# Patient Record
Sex: Male | Born: 1955 | Race: White | Hispanic: No | Marital: Married
Health system: Southern US, Community
[De-identification: ages and names within clinical notes are randomized; demographics above are authoritative.]

## PROBLEM LIST (undated history)

## (undated) DIAGNOSIS — F1010 Alcohol abuse, uncomplicated: Secondary | ICD-10-CM

## (undated) DIAGNOSIS — I499 Cardiac arrhythmia, unspecified: Secondary | ICD-10-CM

## (undated) DIAGNOSIS — J302 Other seasonal allergic rhinitis: Secondary | ICD-10-CM

## (undated) DIAGNOSIS — M1990 Unspecified osteoarthritis, unspecified site: Secondary | ICD-10-CM

## (undated) DIAGNOSIS — L719 Rosacea, unspecified: Secondary | ICD-10-CM

## (undated) DIAGNOSIS — J189 Pneumonia, unspecified organism: Secondary | ICD-10-CM

## (undated) HISTORY — PX: COLONOSCOPY: SHX174

## (undated) HISTORY — DX: Cardiac arrhythmia, unspecified: I49.9

## (undated) HISTORY — DX: Alcohol abuse, uncomplicated: F10.10

---

## 1975-10-23 HISTORY — PX: KNEE ARTHROTOMY: SUR107

## 1999-08-07 ENCOUNTER — Ambulatory Visit (HOSPITAL_COMMUNITY): Admission: RE | Admit: 1999-08-07 | Discharge: 1999-08-07 | Payer: Self-pay | Admitting: Orthopedic Surgery

## 1999-08-07 ENCOUNTER — Encounter: Payer: Self-pay | Admitting: Orthopedic Surgery

## 1999-08-21 ENCOUNTER — Encounter: Payer: Self-pay | Admitting: Orthopedic Surgery

## 1999-08-21 ENCOUNTER — Ambulatory Visit (HOSPITAL_COMMUNITY): Admission: RE | Admit: 1999-08-21 | Discharge: 1999-08-21 | Payer: Self-pay | Admitting: Orthopedic Surgery

## 1999-09-04 ENCOUNTER — Encounter: Payer: Self-pay | Admitting: Orthopedic Surgery

## 1999-09-04 ENCOUNTER — Ambulatory Visit (HOSPITAL_COMMUNITY): Admission: RE | Admit: 1999-09-04 | Discharge: 1999-09-04 | Payer: Self-pay | Admitting: Orthopedic Surgery

## 2004-01-25 ENCOUNTER — Encounter: Admission: RE | Admit: 2004-01-25 | Discharge: 2004-01-25 | Payer: Self-pay | Admitting: General Surgery

## 2004-01-27 ENCOUNTER — Ambulatory Visit (HOSPITAL_BASED_OUTPATIENT_CLINIC_OR_DEPARTMENT_OTHER): Admission: RE | Admit: 2004-01-27 | Discharge: 2004-01-27 | Payer: Self-pay | Admitting: General Surgery

## 2004-01-27 ENCOUNTER — Ambulatory Visit (HOSPITAL_COMMUNITY): Admission: RE | Admit: 2004-01-27 | Discharge: 2004-01-27 | Payer: Self-pay | Admitting: General Surgery

## 2004-06-19 ENCOUNTER — Emergency Department (HOSPITAL_COMMUNITY): Admission: EM | Admit: 2004-06-19 | Discharge: 2004-06-19 | Payer: Self-pay | Admitting: Emergency Medicine

## 2011-01-23 ENCOUNTER — Emergency Department (HOSPITAL_COMMUNITY)
Admission: EM | Admit: 2011-01-23 | Discharge: 2011-01-24 | Disposition: A | Discharge status: Home / Self Care | Payer: PRIVATE HEALTH INSURANCE | Attending: Emergency Medicine | Admitting: Emergency Medicine

## 2011-01-23 DIAGNOSIS — Y9367 Activity, basketball: Secondary | ICD-10-CM | POA: Insufficient documentation

## 2011-01-23 DIAGNOSIS — W219XXA Striking against or struck by unspecified sports equipment, initial encounter: Secondary | ICD-10-CM | POA: Insufficient documentation

## 2011-01-23 DIAGNOSIS — S0180XA Unspecified open wound of other part of head, initial encounter: Secondary | ICD-10-CM | POA: Insufficient documentation

## 2011-01-23 DIAGNOSIS — S0990XA Unspecified injury of head, initial encounter: Secondary | ICD-10-CM | POA: Insufficient documentation

## 2014-08-17 ENCOUNTER — Ambulatory Visit: Payer: Self-pay | Admitting: Orthopedic Surgery

## 2014-08-17 NOTE — Progress Notes (Signed)
Preoperative surgical orders have been place into the [facility] hospital system for Danny Kerr on 08/17/2014, 5:44 PM  by Mickel Crow for surgery on 09/06/2014.  Preop Total Knee orders including Experal, IV Tylenol, and IV Decadron as long as there are no contraindications to the above medications. Arlee Muslim, PA-C

## 2014-08-25 ENCOUNTER — Other Ambulatory Visit (HOSPITAL_COMMUNITY): Payer: Self-pay | Admitting: *Deleted

## 2014-08-25 NOTE — Patient Instructions (Addendum)
Danny Kerr  08/25/2014                           YOUR PROCEDURE IS SCHEDULED ON: 09/06/14                ENTER FROM [address] - GO TO [address] [address]               LOOK FOR [facility]  / [employer]                              FOLLOW  SIGNS TO SHORT STAY CENTER                 ARRIVE AT SHORT STAY AT: 10:45 AM               CALL THIS NUMBER IF ANY PROBLEMS THE DAY OF SURGERY :               [phone]                                REMEMBER:   Do not eat food  AFTER MIDNIGHT              MAY HAVE CLEAR LIQUIDS UNTIL 7:45 AM     CLEAR LIQUID DIET   Foods Allowed                                                                     Foods Excluded  Coffee and tea, regular and decaf                             liquids that you cannot  Plain Jell-O in any flavor                                             see through such as: Fruit ices (not with fruit pulp)                                     milk, soups, orange juice  Iced Popsicles                                    All solid food Carbonated beverages, regular and diet                                    Cranberry, grape and apple juices Sports drinks like Gatorade Lightly seasoned clear broth or consume(fat free) Sugar, honey syrup  _____________________________________________________________________                    Take these medicines the morning of surgery with  A SIPS OF WATER :    NONE     Do not wear jewelry, make-up   Do not wear lotions, powders, or perfumes.   Do not shave legs or underarms 12 hrs. before surgery (men may shave face)  Do not bring valuables to the hospital.  Contacts, dentures or bridgework may not be worn into surgery.  Leave suitcase in the car. After surgery it may be brought to your room.  For patients admitted to the hospital more than one night, checkout time is            11:00 AM                                                        ________________________________________________________________________                                                                                                  [place]  Before surgery, you can play an important role.  Because skin is not sterile, your skin needs to be as free of germs as possible.  You can reduce the number of germs on your skin by washing with CHG (chlorahexidine gluconate) soap before surgery.  CHG is an antiseptic cleaner which kills germs and bonds with the skin to continue killing germs even after washing. Please DO NOT use if you have an allergy to CHG or antibacterial soaps.  If your skin becomes reddened/irritated stop using the CHG and inform your nurse when you arrive at Short Stay. Do not shave (including legs and underarms) for at least 48 hours prior to the first CHG shower.  You may shave your face. Please follow these instructions carefully:   1.  Shower with CHG Soap the night before surgery and the  morning of Surgery.   2.  If you choose to wash your hair, wash your hair first as usual with your  normal  Shampoo.   3.  After you shampoo, rinse your hair and body thoroughly to remove the  shampoo.                                         4.  Use CHG as you would any other liquid soap.  You can apply chg directly  to the skin and wash . Gently wash with scrungie or clean wascloth    5.  Apply the CHG Soap to your body ONLY FROM THE NECK DOWN.   Do not use on open                           Wound or open sores. Avoid contact with eyes, ears mouth and genitals (private parts).  Genitals (private parts) with your normal soap.              6.  Wash thoroughly, paying special attention to the area where your surgery  will be performed.   7.  Thoroughly rinse your body with warm water from the neck down.   8.  DO NOT shower/wash with your normal soap after using and rinsing off  the CHG Soap .                 9.  Pat yourself dry with a clean towel.             10.  Wear clean pajamas.             11.  Place clean sheets on your bed the night of your first shower and do not  sleep with pets.  Day of Surgery : Do not apply any lotions/deodorants the morning of surgery.  Please wear clean clothes to the hospital/surgery center.  FAILURE TO FOLLOW THESE INSTRUCTIONS MAY RESULT IN THE CANCELLATION OF YOUR SURGERY    PATIENT SIGNATURE_________________________________  ______________________________________________________________________     Danny Kerr  An incentive spirometer is a tool that can help keep your lungs clear and active. This tool measures how well you are filling your lungs with each breath. Taking long deep breaths may help reverse or decrease the chance of developing breathing (pulmonary) problems (especially infection) following:  A long period of time when you are unable to move or be active. BEFORE THE PROCEDURE   If the spirometer includes an indicator to show your best effort, your nurse or respiratory therapist will set it to a desired goal.  If possible, sit up straight or lean slightly forward. Try not to slouch.  Hold the incentive spirometer in an upright position. INSTRUCTIONS FOR USE   Sit on the edge of your bed if possible, or sit up as far as you can in bed or on a chair.  Hold the incentive spirometer in an upright position.  Breathe out normally.  Place the mouthpiece in your mouth and seal your lips tightly around it.  Breathe in slowly and as deeply as possible, raising the piston or the ball toward the top of the column.  Hold your breath for 3-5 seconds or for as long as possible. Allow the piston or ball to fall to the bottom of the column.  Remove the mouthpiece from your mouth and breathe out normally.  Rest for a few seconds and repeat Steps 1 through 7 at least 10 times every 1-2 hours when you are awake. Take your  time and take a few normal breaths between deep breaths.  The spirometer may include an indicator to show your best effort. Use the indicator as a goal to work toward during each repetition.  After each set of 10 deep breaths, practice coughing to be sure your lungs are clear. If you have an incision (the cut made at the time of surgery), support your incision when coughing by placing a pillow or rolled up towels firmly against it. Once you are able to get out of bed, walk around indoors and cough well. You may stop using the incentive spirometer when instructed by your caregiver.  RISKS AND COMPLICATIONS  Take your time so you do not get dizzy or light-headed.  If you are in pain, you may need to take or ask for pain medication before doing incentive spirometry. It is harder to take a  deep breath if you are having pain. AFTER USE  Rest and breathe slowly and easily.  It can be helpful to keep track of a log of your progress. Your caregiver can provide you with a simple table to help with this. If you are using the spirometer at home, follow these instructions: [place] IF:   You are having difficultly using the spirometer.  You have trouble using the spirometer as often as instructed.  Your pain medication is not giving enough relief while using the spirometer.  You develop fever of 100.5 F (38.1 C) or higher. SEEK IMMEDIATE MEDICAL CARE IF:   You cough up bloody sputum that had not been present before.  You develop fever of 102 F (38.9 C) or greater.  You develop worsening pain at or near the incision site. MAKE SURE YOU:   Understand these instructions.  Will watch your condition.  Will get help right away if you are not doing well or get worse. Document Released: 02/18/2007 Document Revised: 12/31/2011 Document Reviewed: 04/21/2007 ExitCare Patient Information 2014 ExitCare,  [place].   ________________________________________________________________________  WHAT IS A BLOOD TRANSFUSION? Blood Transfusion Information  A transfusion is the replacement of blood or some of its parts. Blood is made up of multiple cells which provide different functions.  Red blood cells carry oxygen and are used for blood loss replacement.  White blood cells fight against infection.  Platelets control bleeding.  Plasma helps clot blood.  Other blood products are available for specialized needs, such as hemophilia or other clotting disorders. BEFORE THE TRANSFUSION  Who gives blood for transfusions?   Healthy volunteers who are fully evaluated to make sure their blood is safe. This is blood bank blood. Transfusion therapy is the safest it has ever been in the practice of medicine. Before blood is taken from a donor, a complete history is taken to make sure that person has no history of diseases nor engages in risky social behavior (examples are intravenous drug use or sexual activity with multiple partners). The donor's travel history is screened to minimize risk of transmitting infections, such as malaria. The donated blood is tested for signs of infectious diseases, such as HIV and hepatitis. The blood is then tested to be sure it is compatible with you in order to minimize the chance of a transfusion reaction. If you or a relative donates blood, this is often done in anticipation of surgery and is not appropriate for emergency situations. It takes many days to process the donated blood. RISKS AND COMPLICATIONS Although transfusion therapy is very safe and saves many lives, the main dangers of transfusion include:   Getting an infectious disease.  Developing a transfusion reaction. This is an allergic reaction to something in the blood you were given. Every precaution is taken to prevent this. The decision to have a blood transfusion has been considered carefully by your caregiver  before blood is given. Blood is not given unless the benefits outweigh the risks. AFTER THE TRANSFUSION  Right after receiving a blood transfusion, you will usually feel much better and more energetic. This is especially true if your red blood cells have gotten low (anemic). The transfusion raises the level of the red blood cells which carry oxygen, and this usually causes an energy increase.  The nurse administering the transfusion will monitor you carefully for complications. HOME CARE INSTRUCTIONS  No special instructions are needed after a transfusion. You may find your energy is better. Speak with your caregiver about  any limitations on activity for underlying diseases you may have. SEEK MEDICAL CARE IF:   Your condition is not improving after your transfusion.  You develop redness or irritation at the intravenous (IV) site. SEEK IMMEDIATE MEDICAL CARE IF:  Any of the following symptoms occur over the next 12 hours:  Shaking chills.  You have a temperature by mouth above 102 F (38.9 C), not controlled by medicine.  Chest, back, or muscle pain.  People around you feel you are not acting correctly or are confused.  Shortness of breath or difficulty breathing.  Dizziness and fainting.  You get a rash or develop hives.  You have a decrease in urine output.  Your urine turns a dark color or changes to pink, red, or brown. Any of the following symptoms occur over the next 10 days:  You have a temperature by mouth above 102 F (38.9 C), not controlled by medicine.  Shortness of breath.  Weakness after normal activity.  The white part of the eye turns yellow (jaundice).  You have a decrease in the amount of urine or are urinating less often.  Your urine turns a dark color or changes to pink, red, or brown. Document Released: 10/05/2000 Document Revised: 12/31/2011 Document Reviewed: 05/24/2008 [facility] Patient Information 2014 [place],  [place].  _______________________________________________________________________

## 2014-08-26 ENCOUNTER — Encounter (HOSPITAL_COMMUNITY): Payer: Self-pay

## 2014-08-26 ENCOUNTER — Encounter (HOSPITAL_COMMUNITY)
Admission: RE | Admit: 2014-08-26 | Discharge: 2014-08-26 | Disposition: A | Discharge status: Home / Self Care | Payer: BC Managed Care – PPO | Source: Ambulatory Visit | Attending: Orthopedic Surgery | Admitting: Orthopedic Surgery

## 2014-08-26 DIAGNOSIS — Z01812 Encounter for preprocedural laboratory examination: Secondary | ICD-10-CM | POA: Insufficient documentation

## 2014-08-26 HISTORY — DX: Rosacea, unspecified: L71.9

## 2014-08-26 HISTORY — DX: Other seasonal allergic rhinitis: J30.2

## 2014-08-26 HISTORY — DX: Unspecified osteoarthritis, unspecified site: M19.90

## 2014-08-26 LAB — COMPREHENSIVE METABOLIC PANEL
ALT: 23 U/L (ref 0–53)
AST: 52 U/L — ABNORMAL HIGH (ref 0–37)
Albumin: 4.1 g/dL (ref 3.5–5.2)
Alkaline Phosphatase: 75 U/L (ref 39–117)
Anion gap: 14 (ref 5–15)
BUN: 9 mg/dL (ref 6–23)
CO2: 26 mEq/L (ref 19–32)
Calcium: 9.6 mg/dL (ref 8.4–10.5)
Chloride: 98 mEq/L (ref 96–112)
Creatinine, Ser: 0.76 mg/dL (ref 0.50–1.35)
GFR calc Af Amer: >90 mL/min (ref >90)
GFR calc non Af Amer: >90 mL/min (ref >90)
Glucose, Bld: 98 mg/dL (ref 70–99)
Potassium: 4.3 mEq/L (ref 3.7–5.3)
Sodium: 138 mEq/L (ref 137–147)
Total Bilirubin: 0.8 mg/dL (ref 0.3–1.2)
Total Protein: 7.6 g/dL (ref 6.0–8.3)

## 2014-08-26 LAB — URINALYSIS, ROUTINE W REFLEX MICROSCOPIC
Bilirubin Urine: NEGATIVE
Glucose, UA: NEGATIVE mg/dL
Hgb urine dipstick: NEGATIVE
Ketones, ur: NEGATIVE mg/dL
Leukocytes, UA: NEGATIVE
Nitrite: NEGATIVE
Protein, ur: NEGATIVE mg/dL
Specific Gravity, Urine: 1.018 (ref 1.005–1.030)
Urobilinogen, UA: 0.2 mg/dL (ref 0.0–1.0)
pH: 5.5 (ref 5.0–8.0)

## 2014-08-26 LAB — APTT: aPTT: 34 seconds (ref 24–37)

## 2014-08-26 LAB — CBC
HCT: 47.3 % (ref 39.0–52.0)
Hemoglobin: 16.3 g/dL (ref 13.0–17.0)
MCH: 32.3 pg (ref 26.0–34.0)
MCHC: 34.5 g/dL (ref 30.0–36.0)
MCV: 93.7 fL (ref 78.0–100.0)
Platelets: 183 10*3/uL (ref 150–400)
RBC: 5.05 MIL/uL (ref 4.22–5.81)
RDW: 12.6 % (ref 11.5–15.5)
WBC: 5.5 10*3/uL (ref 4.0–10.5)

## 2014-08-26 LAB — SURGICAL PCR SCREEN
MRSA, PCR: INVALID — AB
Staphylococcus aureus: INVALID — AB

## 2014-08-26 LAB — PROTIME-INR
INR: 1.08 (ref 0.00–1.49)
Prothrombin Time: 14.1 seconds (ref 11.6–15.2)

## 2014-08-30 LAB — MRSA CULTURE

## 2014-09-05 ENCOUNTER — Ambulatory Visit: Payer: Self-pay | Admitting: Orthopedic Surgery

## 2014-09-05 NOTE — H&P (Signed)
Danny Kerr DOB: 1956-01-05 Married / Language: English / Race: White Male Date of Admission:  09/06/2014 CC:  Right knee pain History of Present Illness The patient is a 58 year old male who comes in for a preoperative History and Physical. The patient is scheduled for a right total knee arthroplasty to be performed by Dr. Dione Plover. Aluisio, MD at [facility] on 09/06/2014. The patient is a 58 year old male who presents for follow up of their knee. The patient is being followed for their right osteoarthritis. They are now 6 month(s) out from when symptoms began. Symptoms reported today include: pain, swelling, aching, stiffness, popping, pain with weightbearing and difficulty ambulating, while the patient does not report symptoms of: locking, catching or giving way. The patient feels that they are doing poorly and report their pain level to be mild to moderate. Current treatment includes: relative rest. The following medication has been used for pain control: none. The patient presents today following MRI. The patient has reported symptom improvement with: Cortisone injections while they have not gotten any relief of their symptoms with: activity modification or conservative measures. The right knee has been giving him trouble for a long time getting progressively worse over the past several months. He said it was tolerable until he had the episode earlier this year and now says that basically the pain is intolerable. It has given him significant dysfunction where he has a hard time getting around. He feels like he has lost a lot of motion. He is now ready to get the knee fixed. They have been treated conservatively in the past for the above stated problem and despite conservative measures, they continue to have progressive pain and severe functional limitations and dysfunction. They have failed non-operative management including home exercise, medications, and injections. It is felt that they  would benefit from undergoing total joint replacement. Risks and benefits of the procedure have been discussed with the patient and they elect to proceed with surgery. There are no active contraindications to surgery such as ongoing infection or rapidly progressive neurological disease.  Allergies No Known Drug Allergies  Problem List/Past Medical  Primary osteoarthritis of right knee (M17.11) Degenerative Disc Disease L4-5  Family History  First Degree Relatives reported Cancer Mother. mother Family history unknown - Adopted Congestive Heart Failure father  Social History  Tobacco / smoke exposure yes Most recent primary occupation Press photographer Exercise Exercises weekly; does running / walking and gym / [employer] Exercises daily; does running / walking and gym / weights Drug/Alcohol Rehab (Previously) no Illicit drug use no Pain Contract no Current drinker 10/12/2013: Currently drinks beer 8-14 times per week Current work status working full time Living situation live with spouse Marital status married Children 2 Number of flights of stairs before winded 4-5 greater than 5 No history of drug/alcohol rehab Not under pain contract Drug/Alcohol Rehab (Currently) no Tobacco use Former smoker, Never smoker. 10/12/2013 never smoker Alcohol use current drinker; drinks beer and hard liquor; 5-7 per week [place] with wife  Medication History  Minocycline HCl ER (115MG  Tablet ER 24HR, Oral) Active.  Past Surgical History  Arthroscopy of Knee right Pilonidal Cyst Excision  Review of Systems General Not Present- Chills, Fatigue, Fever, Memory Loss, Night Sweats, Weight Gain and Weight Loss. Skin Not Present- Eczema, Hives, Itching, Lesions and Rash. HEENT Not Present- Dentures, Double Vision, Headache, Hearing Loss, Tinnitus and Visual Loss. Respiratory Present- Chronic Cough. Not Present- Allergies, Coughing up blood, Shortness of breath  at rest  and Shortness of breath with exertion. Cardiovascular Not Present- Chest Pain, Difficulty Breathing Lying Down, Murmur, Palpitations, Racing/skipping heartbeats and Swelling. Gastrointestinal Not Present- Abdominal Pain, Bloody Stool, Constipation, Diarrhea, Difficulty Swallowing, Heartburn, Jaundice, Loss of appetitie, Nausea and Vomiting. Male Genitourinary Not Present- Blood in Urine, Discharge, Flank Pain, Incontinence, Painful Urination, Urgency, Urinary frequency, Urinary Retention, Urinating at Night and Weak urinary stream. Musculoskeletal Present- Joint Pain. Not Present- Back Pain, Joint Swelling, Morning Stiffness, Muscle Pain, Muscle Weakness and Spasms. Neurological Not Present- Blackout spells, Difficulty with balance, Dizziness, Paralysis, Tremor and Weakness. Psychiatric Not Present- Insomnia.  Vitals Weight: 218 lb Height: 75in Weight was reported by patient. Height was reported by patient. Body Surface Area: 2.28 m Body Mass Index: 27.25 kg/m  BP: 142/88 (Sitting, Left Arm, Kerr)   Physical Exam General Mental Status -Alert, cooperative and good historian. General Appearance-pleasant, Not in acute distress. Orientation-Oriented X3. Build & Nutrition-Well nourished and Well developed.  Head and Neck Head-normocephalic, atraumatic . Neck Global Assessment - supple, no bruit auscultated on the right, no bruit auscultated on the left.  Eye Pupil - Bilateral-Irregular and Round. Motion - Bilateral-EOMI.  Chest and Lung Exam Auscultation Breath sounds - clear at anterior chest wall and clear at posterior chest wall. Adventitious sounds - No Adventitious sounds.  Cardiovascular Auscultation Rhythm - Regular rate and rhythm. Heart Sounds - S1 WNL and S2 WNL. Murmurs & Other Heart Sounds - Auscultation of the heart reveals - No Murmurs.  Abdomen Palpation/Percussion Tenderness - Abdomen is non-tender to palpation. Rigidity (guarding) -  Abdomen is soft. Auscultation Auscultation of the abdomen reveals - Bowel sounds normal.  Male Genitourinary Note: Not done, not pertinent to present illness   Musculoskeletal Note: On exam, he is alert and oriented in no apparent distress. Evaluation of his right knee shows no effusion. His range of motion is about 5-95. He does not have any motion beyond that. There is marked crepitus on range of motion with tenderness medial and lateral, lateral worse than medial. No instability. His left knee exam is unremarkable.  RADIOGRAPHS: AP both knees and lateral show that he has bone on bone arthritis patellofemoral as well as near bone on bone lateral. I reviewed his MRI and he has horiffic changes on the MRI. He has loose bodies throughout the knee. He is bone on bone patellofemoral. He has advanced degenerative changes medial and lateral compartments with moderate osteophyte formation throughout the knee and degenerative meniscal tears on both sides.   Assessment & Plan Primary osteoarthritis of right knee (M17.11) Note:Plan is for a Right Total Knee Replacement by Dr. Wynelle Link.  Plan is to go home with wife Tye [place].  PCP - Dr. Ardeth Perfect - Patient has been seen preoperatively and felt to be stable for surgery.  The patient does not have any contraindications and will receive TXA (tranexamic acid) prior to surgery.  Signed electronically by Joelene Millin, III PA-C

## 2014-09-06 ENCOUNTER — Inpatient Hospital Stay (HOSPITAL_COMMUNITY): Payer: BC Managed Care – PPO | Admitting: Anesthesiology

## 2014-09-06 ENCOUNTER — Encounter (HOSPITAL_COMMUNITY): Payer: Self-pay | Admitting: *Deleted

## 2014-09-06 ENCOUNTER — Encounter (HOSPITAL_COMMUNITY)
Admission: RE | Disposition: A | Discharge status: Home Health | Payer: Self-pay | Source: Ambulatory Visit | Attending: Orthopedic Surgery

## 2014-09-06 ENCOUNTER — Inpatient Hospital Stay (HOSPITAL_COMMUNITY)
Admission: RE | Admit: 2014-09-06 | Discharge: 2014-09-08 | DRG: 470 | Disposition: A | Discharge status: Home Health | Payer: BC Managed Care – PPO | Source: Ambulatory Visit | Attending: Orthopedic Surgery | Admitting: Orthopedic Surgery

## 2014-09-06 DIAGNOSIS — M25561 Pain in right knee: Secondary | ICD-10-CM | POA: Diagnosis present

## 2014-09-06 DIAGNOSIS — Z87891 Personal history of nicotine dependence: Secondary | ICD-10-CM | POA: Diagnosis not present

## 2014-09-06 DIAGNOSIS — M1710 Unilateral primary osteoarthritis, unspecified knee: Secondary | ICD-10-CM | POA: Diagnosis present

## 2014-09-06 DIAGNOSIS — M1711 Unilateral primary osteoarthritis, right knee: Secondary | ICD-10-CM | POA: Diagnosis present

## 2014-09-06 DIAGNOSIS — Z01812 Encounter for preprocedural laboratory examination: Secondary | ICD-10-CM

## 2014-09-06 DIAGNOSIS — Z96659 Presence of unspecified artificial knee joint: Secondary | ICD-10-CM | POA: Insufficient documentation

## 2014-09-06 DIAGNOSIS — M179 Osteoarthritis of knee, unspecified: Secondary | ICD-10-CM | POA: Diagnosis present

## 2014-09-06 HISTORY — PX: TOTAL KNEE ARTHROPLASTY: SHX125

## 2014-09-06 LAB — TYPE AND SCREEN
ABO/RH(D): O NEG
Antibody Screen: NEGATIVE

## 2014-09-06 LAB — ABO/RH: ABO/RH(D): O NEG

## 2014-09-06 SURGERY — ARTHROPLASTY, KNEE, TOTAL
Anesthesia: Spinal | Site: Knee | Laterality: Right

## 2014-09-06 MED ORDER — POLYETHYLENE GLYCOL 3350 17 G PO PACK: 17.0000 g | PACK | Freq: Every day | ORAL | Status: DC | PRN

## 2014-09-06 MED ORDER — METHOCARBAMOL 1000 MG/10ML IJ SOLN
500.0000 mg | Freq: Four times a day (QID) | INTRAMUSCULAR | Status: DC | PRN
  Administered 2014-09-06: 500 mg via INTRAVENOUS
  Filled 2014-09-06 (×2): qty 5

## 2014-09-06 MED ORDER — PROPOFOL 10 MG/ML IV BOLUS
INTRAVENOUS | Status: AC
  Filled 2014-09-06: qty 20

## 2014-09-06 MED ORDER — METOCLOPRAMIDE HCL 5 MG/ML IJ SOLN: 5.0000 mg | Freq: Three times a day (TID) | INTRAMUSCULAR | Status: DC | PRN

## 2014-09-06 MED ORDER — SODIUM CHLORIDE 0.9 % IV SOLN
INTRAVENOUS | Status: DC
Start: 2014-09-06 — End: 2014-09-06

## 2014-09-06 MED ORDER — METOCLOPRAMIDE HCL 10 MG PO TABS: 5.0000 mg | ORAL_TABLET | Freq: Three times a day (TID) | ORAL | Status: DC | PRN

## 2014-09-06 MED ORDER — FENTANYL CITRATE 0.05 MG/ML IJ SOLN
INTRAMUSCULAR | Status: DC | PRN
  Administered 2014-09-06 (×2): 50 ug via INTRAVENOUS

## 2014-09-06 MED ORDER — TRANEXAMIC ACID 100 MG/ML IV SOLN
1000.0000 mg | INTRAVENOUS | Status: AC
  Administered 2014-09-06: 1000 mg via INTRAVENOUS
  Filled 2014-09-06: qty 10

## 2014-09-06 MED ORDER — CHLORHEXIDINE GLUCONATE 4 % EX LIQD: 60.0000 mL | Freq: Once | CUTANEOUS | Status: DC

## 2014-09-06 MED ORDER — RIVAROXABAN 10 MG PO TABS
10.0000 mg | ORAL_TABLET | Freq: Every day | ORAL | Status: DC
  Administered 2014-09-07 – 2014-09-08 (×2): 10 mg via ORAL
  Filled 2014-09-06 (×3): qty 1

## 2014-09-06 MED ORDER — 0.9 % SODIUM CHLORIDE (POUR BTL) OPTIME
TOPICAL | Status: DC | PRN
  Administered 2014-09-06: 1000 mL

## 2014-09-06 MED ORDER — SODIUM CHLORIDE 0.9 % IR SOLN
Status: DC | PRN
  Administered 2014-09-06: 1000 mL

## 2014-09-06 MED ORDER — ONDANSETRON HCL 4 MG PO TABS: 4.0000 mg | ORAL_TABLET | Freq: Four times a day (QID) | ORAL | Status: DC | PRN

## 2014-09-06 MED ORDER — PHENYLEPHRINE HCL 10 MG/ML IJ SOLN
INTRAMUSCULAR | Status: AC
  Filled 2014-09-06: qty 1

## 2014-09-06 MED ORDER — DEXAMETHASONE SODIUM PHOSPHATE 10 MG/ML IJ SOLN
10.0000 mg | Freq: Once | INTRAMUSCULAR | Status: AC
  Administered 2014-09-07: 10 mg via INTRAVENOUS
  Filled 2014-09-06: qty 1

## 2014-09-06 MED ORDER — CEFAZOLIN SODIUM-DEXTROSE 2-3 GM-% IV SOLR
2.0000 g | INTRAVENOUS | Status: AC
  Administered 2014-09-06: 2 g via INTRAVENOUS

## 2014-09-06 MED ORDER — PROPOFOL 10 MG/ML IV BOLUS
INTRAVENOUS | Status: DC | PRN
  Administered 2014-09-06 (×2): 20 mg via INTRAVENOUS

## 2014-09-06 MED ORDER — DEXTROSE-NACL 5-0.9 % IV SOLN
INTRAVENOUS | Status: DC
  Administered 2014-09-06: 18:00:00 via INTRAVENOUS

## 2014-09-06 MED ORDER — LACTATED RINGERS IV SOLN
INTRAVENOUS | Status: DC | PRN
  Administered 2014-09-06 (×2): via INTRAVENOUS

## 2014-09-06 MED ORDER — FLEET ENEMA 7-19 GM/118ML RE ENEM: 1.0000 | ENEMA | Freq: Once | RECTAL | Status: AC | PRN

## 2014-09-06 MED ORDER — BUPIVACAINE HCL (PF) 0.75 % IJ SOLN
INTRAMUSCULAR | Status: DC | PRN
  Administered 2014-09-06: 15 mg via INTRATHECAL

## 2014-09-06 MED ORDER — LIDOCAINE HCL (CARDIAC) 20 MG/ML IV SOLN
INTRAVENOUS | Status: AC
  Filled 2014-09-06: qty 5

## 2014-09-06 MED ORDER — TRAMADOL HCL 50 MG PO TABS: 50.0000 mg | ORAL_TABLET | Freq: Four times a day (QID) | ORAL | Status: DC | PRN

## 2014-09-06 MED ORDER — BUPIVACAINE HCL 0.25 % IJ SOLN
INTRAMUSCULAR | Status: DC | PRN
  Administered 2014-09-06: 30 mL

## 2014-09-06 MED ORDER — MIDAZOLAM HCL 2 MG/2ML IJ SOLN
INTRAMUSCULAR | Status: AC
  Filled 2014-09-06: qty 2

## 2014-09-06 MED ORDER — ONDANSETRON HCL 4 MG/2ML IJ SOLN
4.0000 mg | Freq: Four times a day (QID) | INTRAMUSCULAR | Status: DC | PRN
Start: 2014-09-06 — End: 2014-09-08

## 2014-09-06 MED ORDER — MORPHINE SULFATE 2 MG/ML IJ SOLN
1.0000 mg | INTRAMUSCULAR | Status: DC | PRN
  Administered 2014-09-06 – 2014-09-07 (×2): 2 mg via INTRAVENOUS
  Filled 2014-09-06 (×2): qty 1

## 2014-09-06 MED ORDER — ACETAMINOPHEN 650 MG RE SUPP: 650.0000 mg | Freq: Four times a day (QID) | RECTAL | Status: DC | PRN

## 2014-09-06 MED ORDER — PROPOFOL INFUSION 10 MG/ML OPTIME
INTRAVENOUS | Status: DC | PRN
Start: 2014-09-06 — End: 2014-09-06
  Administered 2014-09-06: 75 ug/kg/min via INTRAVENOUS

## 2014-09-06 MED ORDER — BISACODYL 10 MG RE SUPP: 10.0000 mg | Freq: Every day | RECTAL | Status: DC | PRN

## 2014-09-06 MED ORDER — METHOCARBAMOL 500 MG PO TABS
500.0000 mg | ORAL_TABLET | Freq: Four times a day (QID) | ORAL | Status: DC | PRN
Start: 2014-09-06 — End: 2014-09-08
  Administered 2014-09-07 – 2014-09-08 (×5): 500 mg via ORAL
  Filled 2014-09-06 (×5): qty 1

## 2014-09-06 MED ORDER — ONDANSETRON HCL 4 MG/2ML IJ SOLN
INTRAMUSCULAR | Status: AC
  Filled 2014-09-06: qty 2

## 2014-09-06 MED ORDER — OXYCODONE HCL 5 MG PO TABS
5.0000 mg | ORAL_TABLET | ORAL | Status: DC | PRN
  Administered 2014-09-06 (×2): 5 mg via ORAL
  Administered 2014-09-07 – 2014-09-08 (×8): 10 mg via ORAL
  Filled 2014-09-06 (×5): qty 2
  Filled 2014-09-06: qty 1
  Filled 2014-09-06 (×2): qty 2
  Filled 2014-09-06: qty 1
  Filled 2014-09-06: qty 2

## 2014-09-06 MED ORDER — DEXAMETHASONE SODIUM PHOSPHATE 10 MG/ML IJ SOLN
10.0000 mg | Freq: Once | INTRAMUSCULAR | Status: AC
  Administered 2014-09-06: 10 mg via INTRAVENOUS

## 2014-09-06 MED ORDER — HYDROMORPHONE HCL 1 MG/ML IJ SOLN: 0.2500 mg | INTRAMUSCULAR | Status: DC | PRN

## 2014-09-06 MED ORDER — PHENYLEPHRINE HCL 10 MG/ML IJ SOLN
10.0000 mg | INTRAVENOUS | Status: DC | PRN
  Administered 2014-09-06: 10 ug/min via INTRAVENOUS

## 2014-09-06 MED ORDER — SODIUM CHLORIDE 0.9 % IJ SOLN
INTRAMUSCULAR | Status: AC
  Filled 2014-09-06: qty 50

## 2014-09-06 MED ORDER — KETOROLAC TROMETHAMINE 15 MG/ML IJ SOLN
7.5000 mg | Freq: Four times a day (QID) | INTRAMUSCULAR | Status: AC | PRN
  Administered 2014-09-07: 7.5 mg via INTRAVENOUS
  Filled 2014-09-06: qty 1

## 2014-09-06 MED ORDER — BUPIVACAINE LIPOSOME 1.3 % IJ SUSP
20.0000 mL | Freq: Once | INTRAMUSCULAR | Status: DC
  Filled 2014-09-06: qty 20

## 2014-09-06 MED ORDER — BUPIVACAINE HCL (PF) 0.25 % IJ SOLN
INTRAMUSCULAR | Status: AC
  Filled 2014-09-06: qty 30

## 2014-09-06 MED ORDER — LACTATED RINGERS IV SOLN: INTRAVENOUS | Status: DC

## 2014-09-06 MED ORDER — ACETAMINOPHEN 500 MG PO TABS
1000.0000 mg | ORAL_TABLET | Freq: Four times a day (QID) | ORAL | Status: AC
  Administered 2014-09-06 – 2014-09-07 (×3): 1000 mg via ORAL
  Filled 2014-09-06 (×4): qty 2

## 2014-09-06 MED ORDER — ACETAMINOPHEN 10 MG/ML IV SOLN
1000.0000 mg | Freq: Once | INTRAVENOUS | Status: AC
Start: 2014-09-06 — End: 2014-09-06
  Administered 2014-09-06: 1000 mg via INTRAVENOUS
  Filled 2014-09-06: qty 100

## 2014-09-06 MED ORDER — CEFAZOLIN SODIUM-DEXTROSE 2-3 GM-% IV SOLR
INTRAVENOUS | Status: AC
  Filled 2014-09-06: qty 50

## 2014-09-06 MED ORDER — LIDOCAINE HCL (CARDIAC) 20 MG/ML IV SOLN
INTRAVENOUS | Status: DC | PRN
  Administered 2014-09-06: 1000 mg via INTRAVENOUS

## 2014-09-06 MED ORDER — PHENYLEPHRINE HCL 10 MG/ML IJ SOLN
INTRAMUSCULAR | Status: DC | PRN
  Administered 2014-09-06: 80 ug via INTRAVENOUS
  Administered 2014-09-06 (×2): 120 ug via INTRAVENOUS

## 2014-09-06 MED ORDER — SODIUM CHLORIDE 0.9 % IJ SOLN
INTRAMUSCULAR | Status: DC | PRN
  Administered 2014-09-06: 30 mL via INTRAVENOUS

## 2014-09-06 MED ORDER — PHENOL 1.4 % MT LIQD
1.0000 | OROMUCOSAL | Status: DC | PRN
  Filled 2014-09-06: qty 177

## 2014-09-06 MED ORDER — BUPIVACAINE LIPOSOME 1.3 % IJ SUSP
INTRAMUSCULAR | Status: DC | PRN
  Administered 2014-09-06: 20 mL

## 2014-09-06 MED ORDER — ONDANSETRON HCL 4 MG/2ML IJ SOLN
INTRAMUSCULAR | Status: DC | PRN
  Administered 2014-09-06: 4 mg via INTRAVENOUS

## 2014-09-06 MED ORDER — BUPIVACAINE IN DEXTROSE 0.75-8.25 % IT SOLN: INTRATHECAL | Status: DC | PRN

## 2014-09-06 MED ORDER — DIPHENHYDRAMINE HCL 12.5 MG/5ML PO ELIX: 12.5000 mg | ORAL_SOLUTION | ORAL | Status: DC | PRN

## 2014-09-06 MED ORDER — FENTANYL CITRATE 0.05 MG/ML IJ SOLN
INTRAMUSCULAR | Status: AC
Start: 2014-09-06 — End: 2014-09-06
  Filled 2014-09-06: qty 2

## 2014-09-06 MED ORDER — ACETAMINOPHEN 325 MG PO TABS
650.0000 mg | ORAL_TABLET | Freq: Four times a day (QID) | ORAL | Status: DC | PRN
  Administered 2014-09-07: 650 mg via ORAL
  Filled 2014-09-06: qty 2

## 2014-09-06 MED ORDER — MENTHOL 3 MG MT LOZG
1.0000 | LOZENGE | OROMUCOSAL | Status: DC | PRN
  Filled 2014-09-06: qty 9

## 2014-09-06 MED ORDER — CEFAZOLIN SODIUM-DEXTROSE 2-3 GM-% IV SOLR
2.0000 g | Freq: Four times a day (QID) | INTRAVENOUS | Status: AC
  Administered 2014-09-06 – 2014-09-07 (×2): 2 g via INTRAVENOUS
  Filled 2014-09-06 (×3): qty 50

## 2014-09-06 MED ORDER — MIDAZOLAM HCL 5 MG/5ML IJ SOLN
INTRAMUSCULAR | Status: DC | PRN
  Administered 2014-09-06: 2 mg via INTRAVENOUS

## 2014-09-06 MED ORDER — DEXAMETHASONE SODIUM PHOSPHATE 10 MG/ML IJ SOLN
INTRAMUSCULAR | Status: AC
  Filled 2014-09-06: qty 1

## 2014-09-06 MED ORDER — DOCUSATE SODIUM 100 MG PO CAPS
100.0000 mg | ORAL_CAPSULE | Freq: Two times a day (BID) | ORAL | Status: DC
  Administered 2014-09-06 – 2014-09-08 (×2): 100 mg via ORAL

## 2014-09-06 SURGICAL SUPPLY — 60 items
BAG SPEC THK2 15X12 ZIP CLS (MISCELLANEOUS) ×1
BAG ZIPLOCK 12X15 (MISCELLANEOUS) ×2 IMPLANT
BANDAGE ELASTIC 6 VELCRO ST LF (GAUZE/BANDAGES/DRESSINGS) ×2 IMPLANT
BANDAGE ESMARK 6X9 LF (GAUZE/BANDAGES/DRESSINGS) ×1 IMPLANT
BLADE SAG 18X100X1.27 (BLADE) ×2 IMPLANT
BLADE SAW SGTL 11.0X1.19X90.0M (BLADE) ×2 IMPLANT
BNDG CMPR 9X6 STRL LF SNTH (GAUZE/BANDAGES/DRESSINGS) ×1
BNDG ESMARK 6X9 LF (GAUZE/BANDAGES/DRESSINGS) ×2
BOWL SMART MIX CTS (DISPOSABLE) ×2 IMPLANT
CAP KNEE ATTUNE RP ×2 IMPLANT
CEMENT HV SMART SET (Cement) ×4 IMPLANT
CUFF TOURN SGL QUICK 34 (TOURNIQUET CUFF) ×2
CUFF TRNQT CYL 34X4X40X1 (TOURNIQUET CUFF) ×1 IMPLANT
DECANTER SPIKE VIAL GLASS SM (MISCELLANEOUS) ×2 IMPLANT
DRAPE EXTREMITY TIBURON (DRAPES) ×2 IMPLANT
DRAPE POUCH INSTRU U-SHP 10X18 (DRAPES) ×2 IMPLANT
DRAPE U-SHAPE 47X51 STRL (DRAPES) ×2 IMPLANT
DRSG ADAPTIC 3X8 NADH LF (GAUZE/BANDAGES/DRESSINGS) ×2 IMPLANT
DRSG PAD ABDOMINAL 8X10 ST (GAUZE/BANDAGES/DRESSINGS) ×2 IMPLANT
DURAPREP 26ML APPLICATOR (WOUND CARE) ×2 IMPLANT
ELECT REM PT RETURN 9FT ADLT (ELECTROSURGICAL) ×2
ELECTRODE REM PT RTRN 9FT ADLT (ELECTROSURGICAL) ×1 IMPLANT
EVACUATOR 1/8 PVC DRAIN (DRAIN) ×2 IMPLANT
FACESHIELD WRAPAROUND (MASK) ×10 IMPLANT
GAUZE SPONGE 4X4 12PLY STRL (GAUZE/BANDAGES/DRESSINGS) ×2 IMPLANT
GLOVE BIO SURGEON STRL SZ7.5 (GLOVE) IMPLANT
GLOVE BIO SURGEON STRL SZ8 (GLOVE) ×2 IMPLANT
GLOVE BIOGEL PI IND STRL 6.5 (GLOVE) IMPLANT
GLOVE BIOGEL PI IND STRL 8 (GLOVE) ×1 IMPLANT
GLOVE BIOGEL PI INDICATOR 6.5 (GLOVE)
GLOVE BIOGEL PI INDICATOR 8 (GLOVE) ×1
GLOVE SURG SS PI 6.5 STRL IVOR (GLOVE) IMPLANT
GOWN STRL REUS W/TWL LRG LVL3 (GOWN DISPOSABLE) ×2 IMPLANT
GOWN STRL REUS W/TWL XL LVL3 (GOWN DISPOSABLE) IMPLANT
HANDPIECE INTERPULSE COAX TIP (DISPOSABLE) ×2
IMMOBILIZER KNEE 20 (SOFTGOODS) ×4 IMPLANT
IMMOBILIZER KNEE 20 THIGH 36 (SOFTGOODS) ×1 IMPLANT
KIT BASIN OR (CUSTOM PROCEDURE TRAY) ×2 IMPLANT
MANIFOLD NEPTUNE II (INSTRUMENTS) ×2 IMPLANT
NDL SAFETY ECLIPSE 18X1.5 (NEEDLE) ×2 IMPLANT
NEEDLE HYPO 18GX1.5 SHARP (NEEDLE) ×4
NS IRRIG 1000ML POUR BTL (IV SOLUTION) ×2 IMPLANT
PACK TOTAL JOINT (CUSTOM PROCEDURE TRAY) ×2 IMPLANT
PAD ABD 8X10 STRL (GAUZE/BANDAGES/DRESSINGS) ×2 IMPLANT
PADDING CAST COTTON 6X4 STRL (CAST SUPPLIES) ×2 IMPLANT
POSITIONER SURGICAL ARM (MISCELLANEOUS) ×2 IMPLANT
SET HNDPC FAN SPRY TIP SCT (DISPOSABLE) ×1 IMPLANT
STRIP CLOSURE SKIN 1/2X4 (GAUZE/BANDAGES/DRESSINGS) ×2 IMPLANT
SUCTION FRAZIER 12FR DISP (SUCTIONS) ×2 IMPLANT
SUT MNCRL AB 4-0 PS2 18 (SUTURE) ×2 IMPLANT
SUT VIC AB 2-0 CT1 27 (SUTURE) ×6
SUT VIC AB 2-0 CT1 TAPERPNT 27 (SUTURE) ×3 IMPLANT
SUT VLOC 180 0 24IN GS25 (SUTURE) ×2 IMPLANT
SYR 20CC LL (SYRINGE) ×2 IMPLANT
SYR 50ML LL SCALE MARK (SYRINGE) ×2 IMPLANT
TOWEL OR 17X26 10 PK STRL BLUE (TOWEL DISPOSABLE) ×2 IMPLANT
TOWEL OR NON WOVEN STRL DISP B (DISPOSABLE) IMPLANT
TRAY FOLEY METER SIL LF 16FR (CATHETERS) ×2 IMPLANT
WATER STERILE IRR 1500ML POUR (IV SOLUTION) ×2 IMPLANT
WRAP KNEE MAXI GEL POST OP (GAUZE/BANDAGES/DRESSINGS) ×2 IMPLANT

## 2014-09-06 NOTE — Transfer of Care (Signed)
Immediate Anesthesia Transfer of Care Note  Patient: Danny Kerr  Procedure(s) Performed: Procedure(s): RIGHT TOTAL KNEE ARTHROPLASTY (Right)  Patient Location: PACU  Anesthesia Type:Spinal  Level of Consciousness: awake, alert , sedated and patient cooperative  Airway & Oxygen Therapy: Patient Spontanous Breathing and Patient connected to face mask oxygen  Post-op Assessment: Report given to PACU RN and Post -op Vital signs reviewed and stable  Post vital signs: stable  Complications: No apparent anesthesia complications  L1 spinal level

## 2014-09-06 NOTE — H&P (View-Only) (Signed)
Danny Kerr DOB: 07-Sep-1956 Married / Language: English / Race: White Male Date of Admission:  09/06/2014 CC:  Right knee pain History of Present Illness The patient is a 58 year old male who comes in for a preoperative History and Physical. The patient is scheduled for a right total knee arthroplasty to be performed by Dr. Dione Plover. Aluisio, MD at [facility] on 09/06/2014. The patient is a 58 year old male who presents for follow up of their knee. The patient is being followed for their right osteoarthritis. They are now 6 month(s) out from when symptoms began. Symptoms reported today include: pain, swelling, aching, stiffness, popping, pain with weightbearing and difficulty ambulating, while the patient does not report symptoms of: locking, catching or giving way. The patient feels that they are doing poorly and report their pain level to be mild to moderate. Current treatment includes: relative rest. The following medication has been used for pain control: none. The patient presents today following MRI. The patient has reported symptom improvement with: Cortisone injections while they have not gotten any relief of their symptoms with: activity modification or conservative measures. The right knee has been giving him trouble for a long time getting progressively worse over the past several months. He said it was tolerable until he had the episode earlier this year and now says that basically the pain is intolerable. It has given him significant dysfunction where he has a hard time getting around. He feels like he has lost a lot of motion. He is now ready to get the knee fixed. They have been treated conservatively in the past for the above stated problem and despite conservative measures, they continue to have progressive pain and severe functional limitations and dysfunction. They have failed non-operative management including home exercise, medications, and injections. It is felt that they  would benefit from undergoing total joint replacement. Risks and benefits of the procedure have been discussed with the patient and they elect to proceed with surgery. There are no active contraindications to surgery such as ongoing infection or rapidly progressive neurological disease.  Allergies No Known Drug Allergies  Problem List/Past Medical  Primary osteoarthritis of right knee (M17.11) Degenerative Disc Disease L4-5  Family History  First Degree Relatives reported Cancer Mother. mother Family history unknown - Adopted Congestive Heart Failure father  Social History  Tobacco / smoke exposure yes Most recent primary occupation Press photographer Exercise Exercises weekly; does running / walking and gym / [employer] Exercises daily; does running / walking and gym / weights Drug/Alcohol Rehab (Previously) no Illicit drug use no Pain Contract no Current drinker 10/12/2013: Currently drinks beer 8-14 times per week Current work status working full time Living situation live with spouse Marital status married Children 2 Number of flights of stairs before winded 4-5 greater than 5 No history of drug/alcohol rehab Not under pain contract Drug/Alcohol Rehab (Currently) no Tobacco use Former smoker, Never smoker. 10/12/2013 never smoker Alcohol use current drinker; drinks beer and hard liquor; 5-7 per week [place] with wife  Medication History  Minocycline HCl ER (115MG  Tablet ER 24HR, Oral) Active.  Past Surgical History  Arthroscopy of Knee right Pilonidal Cyst Excision  Review of Systems General Not Present- Chills, Fatigue, Fever, Memory Loss, Night Sweats, Weight Gain and Weight Loss. Skin Not Present- Eczema, Hives, Itching, Lesions and Rash. HEENT Not Present- Dentures, Double Vision, Headache, Hearing Loss, Tinnitus and Visual Loss. Respiratory Present- Chronic Cough. Not Present- Allergies, Coughing up blood, Shortness of breath  at rest  and Shortness of breath with exertion. Cardiovascular Not Present- Chest Pain, Difficulty Breathing Lying Down, Murmur, Palpitations, Racing/skipping heartbeats and Swelling. Gastrointestinal Not Present- Abdominal Pain, Bloody Stool, Constipation, Diarrhea, Difficulty Swallowing, Heartburn, Jaundice, Loss of appetitie, Nausea and Vomiting. Male Genitourinary Not Present- Blood in Urine, Discharge, Flank Pain, Incontinence, Painful Urination, Urgency, Urinary frequency, Urinary Retention, Urinating at Night and Weak urinary stream. Musculoskeletal Present- Joint Pain. Not Present- Back Pain, Joint Swelling, Morning Stiffness, Muscle Pain, Muscle Weakness and Spasms. Neurological Not Present- Blackout spells, Difficulty with balance, Dizziness, Paralysis, Tremor and Weakness. Psychiatric Not Present- Insomnia.  Vitals Weight: 218 lb Height: 75in Weight was reported by patient. Height was reported by patient. Body Surface Area: 2.28 m Body Mass Index: 27.25 kg/m  BP: 142/88 (Sitting, Left Arm, Kerr)   Physical Exam General Mental Status -Alert, cooperative and good historian. General Appearance-pleasant, Not in acute distress. Orientation-Oriented X3. Build & Nutrition-Well nourished and Well developed.  Head and Neck Head-normocephalic, atraumatic . Neck Global Assessment - supple, no bruit auscultated on the right, no bruit auscultated on the left.  Eye Pupil - Bilateral-Irregular and Round. Motion - Bilateral-EOMI.  Chest and Lung Exam Auscultation Breath sounds - clear at anterior chest wall and clear at posterior chest wall. Adventitious sounds - No Adventitious sounds.  Cardiovascular Auscultation Rhythm - Regular rate and rhythm. Heart Sounds - S1 WNL and S2 WNL. Murmurs & Other Heart Sounds - Auscultation of the heart reveals - No Murmurs.  Abdomen Palpation/Percussion Tenderness - Abdomen is non-tender to palpation. Rigidity (guarding) -  Abdomen is soft. Auscultation Auscultation of the abdomen reveals - Bowel sounds normal.  Male Genitourinary Note: Not done, not pertinent to present illness   Musculoskeletal Note: On exam, he is alert and oriented in no apparent distress. Evaluation of his right knee shows no effusion. His range of motion is about 5-95. He does not have any motion beyond that. There is marked crepitus on range of motion with tenderness medial and lateral, lateral worse than medial. No instability. His left knee exam is unremarkable.  RADIOGRAPHS: AP both knees and lateral show that he has bone on bone arthritis patellofemoral as well as near bone on bone lateral. I reviewed his MRI and he has horiffic changes on the MRI. He has loose bodies throughout the knee. He is bone on bone patellofemoral. He has advanced degenerative changes medial and lateral compartments with moderate osteophyte formation throughout the knee and degenerative meniscal tears on both sides.   Assessment & Plan Primary osteoarthritis of right knee (M17.11) Note:Plan is for a Right Total Knee Replacement by Dr. Wynelle Link.  Plan is to go home with wife Danny Kerr.  PCP - Dr. Ardeth Perfect - Patient has been seen preoperatively and felt to be stable for surgery.  The patient does not have any contraindications and will receive TXA (tranexamic acid) prior to surgery.  Signed electronically by Joelene Millin, III PA-C

## 2014-09-06 NOTE — Anesthesia Procedure Notes (Signed)
Spinal Patient location during procedure: OR Start time: 09/06/2014 1:50 PM End time: 09/06/2014 1:55 PM Staffing Anesthesiologist: Rod Mae L Performed by: anesthesiologist  Preanesthetic Checklist Completed: patient identified, site marked, surgical consent, pre-op evaluation, timeout performed, IV checked, risks and benefits discussed and monitors and equipment checked Spinal Block Patient position: sitting Prep: Betadine Patient monitoring: heart rate, continuous pulse ox and blood pressure Approach: midline Location: L3-4 Injection technique: single-shot Needle Needle type: Whitacre  Needle gauge: 25 G Needle length: 9 cm Assessment Sensory level: T6 Additional Notes Expiration date of kit checked and confirmed. Patient tolerated procedure well, without complications.

## 2014-09-06 NOTE — Interval H&P Note (Signed)
History and Physical Interval Note:  09/06/2014 11:46 AM  Danny Kerr  has presented today for surgery, with the diagnosis of RIGHT KNEE OA  The various methods of treatment have been discussed with the patient and family. After consideration of risks, benefits and other options for treatment, the patient has consented to  Procedure(s): RIGHT TOTAL KNEE ARTHROPLASTY (Right) as a surgical intervention .  The patient's history has been reviewed, patient examined, no change in status, stable for surgery.  I have reviewed the patient's chart and labs.  Questions were answered to the patient's satisfaction.     Gearlean Alf

## 2014-09-06 NOTE — Anesthesia Preprocedure Evaluation (Addendum)
Anesthesia Evaluation  Patient identified by MRN, date of birth, ID band Patient awake    Reviewed: Allergy & Precautions, H&P , NPO status , Patient's Chart, lab work & pertinent test results  Airway Mallampati: II  TM Distance: >3 FB Neck ROM: full    Dental no notable dental hx. (+) Teeth Intact, Dental Advisory Given   Pulmonary neg pulmonary ROS, former smoker,  breath sounds clear to auscultation  Pulmonary exam normal       Cardiovascular Exercise Tolerance: Good negative cardio ROS  Rhythm:regular Rate:Normal     Neuro/Psych negative neurological ROS  negative psych ROS   GI/Hepatic negative GI ROS, Neg liver ROS,   Endo/Other  negative endocrine ROS  Renal/GU negative Renal ROS  negative genitourinary   Musculoskeletal   Abdominal   Peds  Hematology negative hematology ROS (+)   Anesthesia Other Findings   Reproductive/Obstetrics negative OB ROS                           Anesthesia Physical Anesthesia Plan  ASA: I  Anesthesia Plan: Spinal   Post-op Pain Management:    Induction:   Airway Management Planned: Simple Face Mask  Additional Equipment:   Intra-op Plan:   Post-operative Plan:   Informed Consent: I have reviewed the patients History and Physical, chart, labs and discussed the procedure including the risks, benefits and alternatives for the proposed anesthesia with the patient or authorized representative who has indicated his/her understanding and acceptance.   Dental Advisory Given  Plan Discussed with: CRNA and Surgeon  Anesthesia Plan Comments:        Anesthesia Quick Evaluation

## 2014-09-06 NOTE — Op Note (Signed)
Pre-operative diagnosis- Osteoarthritis  Right knee(s)  Post-operative diagnosis- Osteoarthritis Right knee(s)  Procedure-  Right  Total Knee Arthroplasty (Attune system)  Surgeon- Dione Plover. Florina Glas, MD  Assistant- Arlee Muslim, PA-C   Anesthesia-  Spinal  EBL-* No blood loss amount entered *   Drains Hemovac  Tourniquet time-  Total Tourniquet Time Documented: Thigh (Right) - 42 minutes Total: Thigh (Right) - 42 minutes     Complications- None  Condition-PACU - hemodynamically stable.   Brief Clinical Note  Danny Kerr is a 58 y.o. year old male with end stage OA of his right knee with progressively worsening pain and dysfunction. He has constant pain, with activity and at rest and significant functional deficits with difficulties even with ADLs. He has had extensive non-op management including analgesics, injections of cortisone, and home exercise program, but remains in significant pain with significant dysfunction. Radiographs show bone on bone arthritis medial and patellofemoral with large marginal osteophytes.Marland Kitchen He presents now for rightt Total Knee Arthroplasty.     Procedure in detail---   The patient is brought into the operating room and positioned supine on the operating table. After successful administration of  Spinal,   a tourniquet is placed high on the  Right thigh(s) and the lower extremity is prepped and draped in the usual sterile fashion. Time out is performed by the operating team and then the  Right lower extremity is wrapped in Esmarch, knee flexed and the tourniquet inflated to 300 mmHg.       A midline incision is made with a ten blade through the subcutaneous tissue to the level of the extensor mechanism. A fresh blade is used to make a medial parapatellar arthrotomy. Soft tissue over the proximal medial tibia is subperiosteally elevated to the joint line with a knife and into the semimembranosus bursa with a Cobb elevator. Soft tissue over the proximal  lateral tibia is elevated with attention being paid to avoiding the patellar tendon on the tibial tubercle. The patella is everted, knee flexed 90 degrees and the ACL and PCL are removed. Findings are bone on bone all 3 compartments with massive global osteophytes.        The drill is used to create a starting hole in the distal femur and the canal is thoroughly irrigated with sterile saline to remove the fatty contents. The 5 degree Right  valgus alignment guide is placed into the femoral canal and the distal femoral cutting block is pinned to remove 10 mm off the distal femur. Resection is made with an oscillating saw.      The tibia is subluxed forward and the menisci are removed. The extramedullary alignment guide is placed referencing proximally at the medial aspect of the tibial tubercle and distally along the second metatarsal axis and tibial crest. The block is pinned to remove 31mm off the more deficient medial  side. Resection is made with an oscillating saw. Size 9is the most appropriate size for the tibia and the proximal tibia is prepared with the modular drill and keel punch for that size.      The femoral sizing guide is placed and size 9 is most appropriate. Rotation is marked off the epicondylar axis and confirmed by creating a rectangular flexion gap at 90 degrees. The size 9 cutting block is pinned in this rotation and the anterior, posterior and chamfer cuts are made with the oscillating saw. The intercondylar block is then placed and that cut is made.  Trial size 9 tibial component, trial size 9 posterior stabilized femur and a 8  mm posterior stabilized rotating platform insert trial is placed. Full extension is achieved with excellent varus/valgus and anterior/posterior balance throughout full range of motion. The patella is everted and thickness measured to be 27  mm. Free hand resection is taken to 15 mm, a 41 template is placed, lug holes are drilled, trial patella is placed, and it  tracks normally. Osteophytes are removed off the posterior femur with the trial in place. All trials are removed and the cut bone surfaces prepared with pulsatile lavage. Cement is mixed and once ready for implantation, the size 9 tibial implant, size  9 posterior stabilized femoral component, and the size 41 patella are cemented in place and the patella is held with the clamp. The trial insert is placed and the knee held in full extension. The Exparel (20 ml mixed with 30 ml saline) and .25% Bupivicaine, are injected into the extensor mechanism, posterior capsule, medial and lateral gutters and subcutaneous tissues.  All extruded cement is removed and once the cement is hard the permanent 8 mm posterior stabilized rotating platform insert is placed into the tibial tray.      The wound is copiously irrigated with saline solution and the extensor mechanism closed over a hemovac drain with #1 V-loc suture. The tourniquet is released for a total tourniquet time of 42  minutes. Flexion against gravity is 135 degrees and the patella tracks normally. Subcutaneous tissue is closed with 2.0 vicryl and subcuticular with running 4.0 Monocryl. The incision is cleaned and dried and steri-strips and a bulky sterile dressing are applied. The limb is placed into a knee immobilizer and the patient is awakened and transported to recovery in stable condition.      Please note that a surgical assistant was a medical necessity for this procedure in order to perform it in a safe and expeditious manner. Surgical assistant was necessary to retract the ligaments and vital neurovascular structures to prevent injury to them and also necessary for proper positioning of the limb to allow for anatomic placement of the prosthesis.   Dione Plover Danny Walther, MD    09/06/2014, 3:00 PM

## 2014-09-06 NOTE — Plan of Care (Signed)
Problem: Phase I Progression Outcomes Goal: CMS/Neurovascular status WDL Outcome: Completed/Met Date Met:  09/06/14 Goal: Pain controlled with appropriate interventions Outcome: Completed/Met Date Met:  09/06/14 Goal: Initial discharge plan identified Outcome: Completed/Met Date Met:  09/06/14 Goal: Hemodynamically stable Outcome: Completed/Met Date Met:  09/06/14     

## 2014-09-07 ENCOUNTER — Encounter (HOSPITAL_COMMUNITY): Payer: Self-pay | Admitting: Orthopedic Surgery

## 2014-09-07 LAB — BASIC METABOLIC PANEL
Anion gap: 12 (ref 5–15)
BUN: 8 mg/dL (ref 6–23)
CO2: 25 mEq/L (ref 19–32)
Calcium: 8.6 mg/dL (ref 8.4–10.5)
Chloride: 96 mEq/L (ref 96–112)
Creatinine, Ser: 0.72 mg/dL (ref 0.50–1.35)
GFR calc Af Amer: >90 mL/min (ref >90)
GFR calc non Af Amer: >90 mL/min (ref >90)
Glucose, Bld: 169 mg/dL — ABNORMAL HIGH (ref 70–99)
Potassium: 4 mEq/L (ref 3.7–5.3)
Sodium: 133 mEq/L — ABNORMAL LOW (ref 137–147)

## 2014-09-07 LAB — CBC
HCT: 36.7 % — ABNORMAL LOW (ref 39.0–52.0)
Hemoglobin: 12.7 g/dL — ABNORMAL LOW (ref 13.0–17.0)
MCH: 32.2 pg (ref 26.0–34.0)
MCHC: 34.6 g/dL (ref 30.0–36.0)
MCV: 93.1 fL (ref 78.0–100.0)
Platelets: 175 10*3/uL (ref 150–400)
RBC: 3.94 MIL/uL — ABNORMAL LOW (ref 4.22–5.81)
RDW: 12.1 % (ref 11.5–15.5)
WBC: 11.5 10*3/uL — ABNORMAL HIGH (ref 4.0–10.5)

## 2014-09-07 MED ORDER — METHOCARBAMOL 500 MG PO TABS: 500.0000 mg | ORAL_TABLET | Freq: Four times a day (QID) | ORAL | Status: DC | PRN

## 2014-09-07 MED ORDER — OXYCODONE HCL 5 MG PO TABS: 5.0000 mg | ORAL_TABLET | ORAL | Status: DC | PRN

## 2014-09-07 MED ORDER — RIVAROXABAN 10 MG PO TABS: 10.0000 mg | ORAL_TABLET | Freq: Every day | ORAL | Status: DC

## 2014-09-07 MED ORDER — TRAMADOL HCL 50 MG PO TABS: 50.0000 mg | ORAL_TABLET | Freq: Four times a day (QID) | ORAL | Status: DC | PRN

## 2014-09-07 NOTE — Evaluation (Signed)
Occupational Therapy Evaluation Patient Details Name: Danny Kerr MRN: [id number] DOB: 1955/11/16 Today's Date: 09/07/2014    History of Present Illness 58 yo male s/p R TKA 09/06/14.    Clinical Impression   Pt doing well but does need cues to slow down for safety and for safety with walker use. All education completed and feel pt will progress with PT for functional transfer safety. Pt plans to sponge bathe initially (pt has a tub on main floor. Explained to pt not to step over tub while still needing to wear KI without brace on). He is able to reach to R foot for LB dressing well.     Follow Up Recommendations  No OT follow up;Supervision - Intermittent    Equipment Recommendations  3 in 1 bedside comode    Recommendations for Other Services       Precautions / Restrictions Precautions Precautions: Knee;Fall Required Braces or Orthoses: Knee Immobilizer - Right Knee Immobilizer - Right: Discontinue once straight leg raise with < 10 degree lag Restrictions Weight Bearing Restrictions: No RLE Weight Bearing: Weight bearing as tolerated      Mobility Bed Mobility           Transfers Overall transfer level: Needs assistance Equipment used: Rolling walker (2 wheeled) Transfers: Sit to/from Stand Sit to Stand: Min guard         General transfer comment: verbal cues for safety, hand placement and LE management.    Balance                                            ADL Overall ADL's : Needs assistance/impaired Eating/Feeding: Independent;Sitting   Grooming: Set up;Sitting   Upper Body Bathing: Set up;Sitting   Lower Body Bathing: Min guard;Sit to/from stand   Upper Body Dressing : Set up;Sitting   Lower Body Dressing: Min guard;Sit to/from stand   Toilet Transfer: Min guard;Ambulation;BSC;RW   Toileting- Water quality scientist and Hygiene: Min guard;Sit to/from stand         General ADL Comments: Pt states he will sponge  bathe until he can get upstairs to the shower stall. Explained that 3in1 can be used as a shower chair and how to adjust. Pt able to reach to R foot for LB dressing and reviewed sequence for dressing and safety with having walker in front of him when he stands. pt able to stand and use urinal in bathroom and then practiced sitting on 3in1. Pt noted to be a little shaky in standing at times. He states his R LE is "tired" from the exercises. Pt wanting to transfer into bathroom without KI and explained that right now he needs KI on when up on his feet. Pt needs cues to stay close to walker and proper walker distance to self.      Vision                     Perception     Praxis      Pertinent Vitals/Pain Pain Assessment: 0-10 Pain Score: 3  Pain Location: R knee Pain Descriptors / Indicators: Aching Pain Intervention(s): Repositioned;Ice applied     Hand Dominance     Extremity/Trunk Assessment Upper Extremity Assessment Upper Extremity Assessment: Overall WFL for tasks assessed      Cervical / Trunk Assessment Cervical / Trunk Assessment: Normal   Communication Communication Communication: No  difficulties   Cognition Arousal/Alertness: Awake/alert Behavior During Therapy: WFL for tasks assessed/performed Overall Cognitive Status: Within Functional Limits for tasks assessed                     General Comments       Exercises       Shoulder Instructions      Home Living Family/patient expects to be discharged to:: Private residence Living Arrangements: Spouse/significant other   Type of Home: House Home Access: Stairs to enter [employer] of Steps: 2-with wall and cabinet close by Entrance Stairs-Rails: None Home Layout: Multi-level;Able to live on main level with bedroom/bathroom     Bathroom Shower/Tub: Teacher, early years/pre: Standard     Home Equipment: Environmental consultant - 2 wheels;Shower seat          Prior  Functioning/Environment Level of Independence: Independent             OT Diagnosis: Generalized weakness   OT Problem List:     OT Treatment/Interventions:      OT Goals(Current goals can be found in the care plan section) Acute Rehab OT Goals Patient Stated Goal: return to independence OT Goal Formulation: With patient  OT Frequency:     Barriers to D/C:            Co-evaluation              End of Session Equipment Utilized During Treatment: Rolling walker;Right knee immobilizer  Activity Tolerance: Patient tolerated treatment well Patient left: in chair;with call bell/phone within reach   Time: 1225-1250 OT Time Calculation (min): 25 min Charges:  OT General Charges $OT Visit: 1 Procedure OT Evaluation $Initial OT Evaluation Tier I: 1 Procedure OT Treatments $Therapeutic Activity: 8-22 mins G-Codes:    Jules Schick  [phone] 09/07/2014, 1:15 PM

## 2014-09-07 NOTE — Progress Notes (Signed)
Physical Therapy Treatment Patient Details Name: Danny Kerr MRN: [id number] DOB: 1955-12-22 Today's Date: 09/07/2014    History of Present Illness 58 yo male s/p R TKA 09/06/14.     PT Comments    Progressing with mobility. Slight increased in pain this session with activity but tolerable for pt.  Follow Up Recommendations  Home health PT     Equipment Recommendations  None recommended by PT    Recommendations for Other Services OT consult     Precautions / Restrictions Precautions Precautions: Knee;Fall Required Braces or Orthoses: Knee Immobilizer - Right Knee Immobilizer - Right: Discontinue once straight leg raise with < 10 degree lag Restrictions Weight Bearing Restrictions: No RLE Weight Bearing: Weight bearing as tolerated    Mobility  Bed Mobility Overal bed mobility: Needs Assistance Bed Mobility: Supine to Sit     Supine to sit: Min assist     General bed mobility comments: pt oob in recliner  Transfers Overall transfer level: Needs assistance Equipment used: Rolling walker (2 wheeled) Transfers: Sit to/from Stand Sit to Stand: Min guard         General transfer comment: close guard for safety.   Ambulation/Gait Ambulation/Gait assistance: Min guard Ambulation Distance (Feet): 250 Feet Assistive device: Rolling walker (2 wheeled) Gait Pattern/deviations: Step-through pattern;Antalgic     General Gait Details: vcs for pacing   Stairs            Wheelchair Mobility    Modified Rankin (Stroke Patients Only)       Balance                                    Cognition Arousal/Alertness: Awake/alert Behavior During Therapy: WFL for tasks assessed/performed Overall Cognitive Status: Within Functional Limits for tasks assessed                      Exercises    General Comments        Pertinent Vitals/Pain Pain Assessment: 0-10 Pain Score: 5  Pain Location: r knee Pain Descriptors /  Indicators: Aching Pain Intervention(s): Premedicated before session;Ice applied    Home Living Family/patient expects to be discharged to:: Private residence Living Arrangements: Spouse/significant other   Type of Home: House Home Access: Stairs to enter Entrance Stairs-Rails: None Home Layout: Multi-level;Able to live on main level with bedroom/bathroom Home Equipment: Gilford Rile - 2 wheels;Shower seat      Prior Function Level of Independence: Independent          PT Goals (current goals can now be found in the care plan section) Acute Rehab PT Goals Patient Stated Goal: return to independence PT Goal Formulation: With patient Time For Goal Achievement: 09/14/14 Potential to Achieve Goals: Good Progress towards PT goals: Progressing toward goals    Frequency  7X/week    PT Plan Current plan remains appropriate    Co-evaluation             End of Session Equipment Utilized During Treatment: Gait belt Activity Tolerance: Patient tolerated treatment well Patient left: in chair;with call bell/phone within reach     Time: 1336-1346 PT Time Calculation (min) (ACUTE ONLY): 10 min  Charges:  $Gait Training: 8-22 mins $Therapeutic Exercise: 8-22 mins                    G Codes:      Weston Anna, MPT Pager: [phone]

## 2014-09-07 NOTE — Progress Notes (Signed)
   Subjective: 1 Day Post-Op Procedure(s) (LRB): RIGHT TOTAL KNEE ARTHROPLASTY (Right) Patient reports pain as mild.   Patient seen in rounds with Dr. Wynelle Link.  Wife in room. Patient is well, and has had no acute complaints or problems We will start therapy today.  Plan is to go Home after hospital stay. Probably tomorrow.  Objective: Vital signs in last 24 hours: Temp:  [97.3 F (36.3 C)-98.5 F (36.9 C)] 98.3 F (36.8 C) (11/17 0853) Pulse Rate:  [59-88] 69 (11/17 0853) Resp:  [13-20] 14 (11/17 0853) BP: (110-136)/(66-92) 113/75 mmHg (11/17 0853) SpO2:  [99 %-100 %] 100 % (11/17 0853) Weight:  [99.338 kg (219 lb)] 99.338 kg (219 lb) (11/16 1129)  Intake/Output from previous day:  Intake/Output Summary (Last 24 hours) at 09/07/14 0941 Last data filed at 09/07/14 0626  Gross per 24 hour  Intake 4203.33 ml  Output   3070 ml  Net 1133.33 ml    Intake/Output this shift: UOP 750 since MN  Labs:  Recent Labs  09/07/14 0458  HGB 12.7*    Recent Labs  09/07/14 0458  WBC 11.5*  RBC 3.94*  HCT 36.7*  PLT 175    Recent Labs  09/07/14 0458  NA 133*  K 4.0  CL 96  CO2 25  BUN 8  CREATININE 0.72  GLUCOSE 169*  CALCIUM 8.6   No results for input(s): LABPT, INR in the last 72 hours.  EXAM General - Patient is Alert, Appropriate and Oriented Extremity - Neurovascular intact Sensation intact distally Dressing - dressing C/D/I Motor Function - intact, moving foot and toes well on exam.  Hemovac pulled without difficulty.  Past Medical History  Diagnosis Date  . Arthritis   . Rosacea   . Seasonal allergies     Assessment/Plan: 1 Day Post-Op Procedure(s) (LRB): RIGHT TOTAL KNEE ARTHROPLASTY (Right) Principal Problem:   OA (osteoarthritis) of knee  Estimated body mass index is 27.37 kg/(m^2) as calculated from the following:   Height as of this encounter: 6\' 3"  (1.905 m).   Weight as of this encounter: 99.338 kg (219 lb). Advance diet Up with  therapy Plan for discharge tomorrow Discharge home with home health  DVT Prophylaxis - Xarelto Weight-Bearing as tolerated to right leg D/C O2 and Pulse OX and try on Room Air  Arlee Muslim, PA-C Orthopaedic Surgery 09/07/2014, 9:41 AM

## 2014-09-07 NOTE — Progress Notes (Signed)
[facility] is providing the following services: Commode and Hospital Bed  If patient discharges after hours, please call [phone].   Linward Headland 09/07/2014, 3:24 PM

## 2014-09-07 NOTE — Plan of Care (Signed)
Problem: Phase I Progression Outcomes Goal: Dangle or out of bed evening of surgery Outcome: Completed/Met Date Met:  09/07/14     

## 2014-09-07 NOTE — Anesthesia Postprocedure Evaluation (Signed)
  Anesthesia Post-op Note  Patient: Danny Kerr  Procedure(s) Performed: Procedure(s) (LRB): RIGHT TOTAL KNEE ARTHROPLASTY (Right)  Patient Location: PACU  Anesthesia Type: Spinal  Level of Consciousness: awake and alert   Airway and Oxygen Therapy: Patient Spontanous Breathing  Post-op Pain: mild  Post-op Assessment: Post-op Vital signs reviewed, Patient's Cardiovascular Status Stable, Respiratory Function Stable, Patent Airway and No signs of Nausea or vomiting  Last Vitals:  Filed Vitals:   09/07/14 0853  BP: 113/75  Pulse: 69  Temp: 36.8 C  Resp: 14    Post-op Vital Signs: stable   Complications: No apparent anesthesia complications

## 2014-09-07 NOTE — Evaluation (Signed)
Physical Therapy Evaluation Patient Details Name: Danny Kerr MRN: [id number] DOB: Feb 25, 1956 Today's Date: 09/07/2014   History of Present Illness  58 yo male s/p R TKA 09/06/14.   Clinical Impression  On eval, pt was Min guard assist for mobility-able to ambulate ~200 feet with walker. Vcs for safety, pacing-pt tends to move impulsively/quickly.Anticipate pt will progress well during hospital stay. Recommend HHPT.     Follow Up Recommendations Home health PT    Equipment Recommendations  None recommended by PT    Recommendations for Other Services OT consult     Precautions / Restrictions Precautions Precautions: Knee;Fall Required Braces or Orthoses: Knee Immobilizer - Right Knee Immobilizer - Right: Discontinue once straight leg raise with < 10 degree lag Restrictions Weight Bearing Restrictions: No RLE Weight Bearing: Weight bearing as tolerated      Mobility  Bed Mobility Overal bed mobility: Needs Assistance Bed Mobility: Supine to Sit     Supine to sit: Min assist     General bed mobility comments: assist for r le.  Transfers Overall transfer level: Needs assistance Equipment used: Rolling walker (2 wheeled) Transfers: Sit to/from Stand Sit to Stand: Min guard         General transfer comment: close guard for safety. vcs safety, hand placement  Ambulation/Gait Ambulation/Gait assistance: Min guard Ambulation Distance (Feet): 200 Feet Assistive device: Rolling walker (2 wheeled) Gait Pattern/deviations: Step-through pattern;Decreased stride length;Antalgic     General Gait Details: vcs for pacing, sequence. pt transitioned to reciprocal gait pattern as distance increased.   Stairs            Wheelchair Mobility    Modified Rankin (Stroke Patients Only)       Balance                                             Pertinent Vitals/Pain Pain Assessment: 0-10 Pain Score: 3  Pain Location: r knee Pain  Intervention(s): Monitored during session;Ice applied;Premedicated before session    Home Living Family/patient expects to be discharged to:: Private residence Living Arrangements: Spouse/significant other   Type of Home: House Home Access: Stairs to enter Entrance Stairs-Rails: None Entrance Stairs-Number of Steps: 2-with wall and cabinet close by Home Layout: Multi-level;Able to live on main level with bedroom/bathroom Home Equipment: Gilford Rile - 2 wheels;Shower seat      Prior Function Level of Independence: Independent               Hand Dominance        Extremity/Trunk Assessment   Upper Extremity Assessment: Defer to OT evaluation           Lower Extremity Assessment: RLE deficits/detail RLE Deficits / Details: hip flex 3-/5, hip abd/add at least 3/5, knee extension at least 2/5, moves ankle well    Cervical / Trunk Assessment: Normal  Communication   Communication: No difficulties  Cognition Arousal/Alertness: Awake/alert Behavior During Therapy: WFL for tasks assessed/performed Overall Cognitive Status: Within Functional Limits for tasks assessed                      General Comments      Exercises Total Joint Exercises Ankle Circles/Pumps: AROM;Both;10 reps;Seated Quad Sets: AROM;Both;10 reps;Seated Heel Slides: AAROM;Right;10 reps;Seated Hip ABduction/ADduction: AAROM;Right;10 reps;Seated Straight Leg Raises: AAROM;Right;10 reps;Seated Goniometric ROM: 10-50 degrees      Assessment/Plan  PT Assessment Patient needs continued PT services  PT Diagnosis Difficulty walking;Acute pain   PT Problem List Decreased strength;Decreased range of motion;Decreased activity tolerance;Decreased balance;Decreased mobility;Pain;Decreased knowledge of use of DME;Decreased knowledge of precautions  PT Treatment Interventions DME instruction;Gait training;Stair training;Functional mobility training;Therapeutic activities;Therapeutic exercise;Balance  training;Patient/family education   PT Goals (Current goals can be found in the Care Plan section) Acute Rehab PT Goals Patient Stated Goal: regain independence PT Goal Formulation: With patient Time For Goal Achievement: 09/14/14 Potential to Achieve Goals: Good    Frequency 7X/week   Barriers to discharge        Co-evaluation               End of Session Equipment Utilized During Treatment: Gait belt Activity Tolerance: Patient tolerated treatment well Patient left: in chair;with call bell/phone within reach           Time: 1005-1030 PT Time Calculation (min) (ACUTE ONLY): 25 min   Charges:     PT Treatments $Gait Training: 8-22 mins $Therapeutic Exercise: 8-22 mins   PT G Codes:          Weston Anna, MPT Pager: [phone]

## 2014-09-07 NOTE — Care Management Note (Signed)
    Page 1 of 2   09/07/2014     12:10:15 PM CARE MANAGEMENT NOTE 09/07/2014  Patient:  Danny Kerr, Danny Kerr   Account Number:  [id number]  Date Initiated:  09/07/2014  Documentation initiated by:  [facility]  Subjective/Objective Assessment:   adm: RIGHT TOTAL KNEE ARTHROPLASTY (Right)     Action/Plan:   discharge planning   Anticipated DC Date:  09/07/2014   Anticipated DC Plan:  [place]  CM consult      [facility] Choice  HOME HEALTH   Choice offered to / List presented to:  [name]   DME arranged  3-N-1  HOSPITAL BED      DME agency  [place] arranged  [place].   Status of service:  Completed, signed off Medicare Important Message given?   (If response is "NO", the following Medicare IM given date fields will be blank) Date Medicare IM given:   Medicare IM given by:   Date Additional Medicare IM given:   Additional Medicare IM given by:    Discharge Disposition:  [place]  Per UR Regulation:    If discussed at Long Length of Stay Meetings, dates discussed:    Comments:  09/07/14 07:45 CM met with pt to offer choice and he requested I call his wife [name] [phone] to arrange. CM called [name] and she staates pt has a rolling walker but needs a hospital bed and 3n1.  [name] also requests we use [facility] to render HHPT.  CM called [facility] DME rep, Lecretia to arrange for bed and 3n1.  Referral called to [facility] rep, Kristen.  CM called Shaune Leeks to notify of change of agency.  No other CM needs were communicated.  Mariane Masters, BSN, CM [phone].

## 2014-09-07 NOTE — Discharge Instructions (Addendum)
° °Dr. Frank Aluisio °Total Joint Specialist °[facility] °[address] °[place], [place] [address] °[phone] ° °TOTAL KNEE REPLACEMENT POSTOPERATIVE DIRECTIONS ° ° ° °Knee Rehabilitation, Guidelines Following Surgery  °Results after knee surgery are often greatly improved when you follow the exercise, range of motion and muscle strengthening exercises prescribed by your doctor. Safety measures are also important to protect the knee from further injury. Any time any of these exercises cause you to have increased pain or swelling in your knee joint, decrease the amount until you are comfortable again and slowly increase them. If you have problems or questions, call your caregiver or physical therapist for advice.  ° °HOME CARE INSTRUCTIONS  °Remove items at home which could result in a fall. This includes throw rugs or furniture in walking pathways.  °Continue medications as instructed at time of discharge. °You may have some home medications which will be placed on hold until you complete the course of blood thinner medication.  °You may start showering once you are discharged home but do not submerge the incision under water. Just pat the incision dry and apply a dry gauze dressing on daily. °Walk with walker as instructed.  °You may resume a sexual relationship in one month or when given the OK by  your doctor.  °· Use walker as long as suggested by your caregivers. °· Avoid periods of inactivity such as sitting longer than an hour when not asleep. This helps prevent blood clots.  °You may put full weight on your legs and walk as much as is comfortable.  °You may return to work once you are cleared by your doctor.  °Do not drive a car for 6 weeks or until released by you surgeon.  °· Do not drive while taking narcotics.  °Wear the elastic stockings for three weeks following surgery during the day but you may remove then at night. °Make sure you keep all of your appointments after your  operation with all of your doctors and caregivers. You should call the office at the above phone number and make an appointment for approximately two weeks after the date of your surgery. °Change the dressing daily and reapply a dry dressing each time. °Please pick up a stool softener and laxative for home use as long as you are requiring pain medications. °· ICE to the affected knee every three hours for 30 minutes at a time and then as needed for pain and swelling.  Continue to use ice on the knee for pain and swelling from surgery. You may notice swelling that will progress down to the foot and ankle.  This is normal after surgery.  Elevate the leg when you are not up walking on it.   °It is important for you to complete the blood thinner medication as prescribed by your doctor. °· Continue to use the breathing machine which will help keep your temperature down.  It is common for your temperature to cycle up and down following surgery, especially at night when you are not up moving around and exerting yourself.  The breathing machine keeps your lungs expanded and your temperature down. ° °RANGE OF MOTION AND STRENGTHENING EXERCISES  °Rehabilitation of the knee is important following a knee injury or an operation. After just a few days of immobilization, the muscles of the thigh which control the knee become weakened and shrink (atrophy). Knee exercises are designed to build up the tone and strength of the thigh muscles and to improve knee   motion. Often times heat used for twenty to thirty minutes before working out will loosen up your tissues and help with improving the range of motion but do not use heat for the first two weeks following surgery. These exercises can be done on a training (exercise) mat, on the floor, on a table or on a bed. Use what ever works the best and is most comfortable for you Knee exercises include:  Leg Lifts - While your knee is still immobilized in a splint or cast, you can do  straight leg raises. Lift the leg to 60 degrees, hold for 3 sec, and slowly lower the leg. Repeat 10-20 times 2-3 times daily. Perform this exercise against resistance later as your knee gets better.  Quad and Hamstring Sets - Tighten up the muscle on the front of the thigh (Quad) and hold for 5-10 sec. Repeat this 10-20 times hourly. Hamstring sets are done by pushing the foot backward against an object and holding for 5-10 sec. Repeat as with quad sets.  A rehabilitation program following serious knee injuries can speed recovery and prevent re-injury in the future due to weakened muscles. Contact your doctor or a physical therapist for more information on knee rehabilitation.   SKILLED REHAB INSTRUCTIONS: If the patient is transferred to a skilled rehab facility following release from the hospital, a list of the current medications will be sent to the facility for the patient to continue.  When discharged from the skilled rehab facility, please have the facility set up the patient's [place] prior to being released. Also, the skilled facility will be responsible for providing the patient with their medications at time of release from the facility to include their pain medication, the muscle relaxants, and their blood thinner medication. If the patient is still at the rehab facility at time of the two week follow up appointment, the skilled rehab facility will also need to assist the patient in arranging follow up appointment in our office and any transportation needs.  MAKE SURE YOU:  Understand these instructions.  Will watch your condition.  Will get help right away if you are not doing well or get worse.    Pick up stool softner and laxative for home. Do not submerge incision under water. May shower. Continue to use ice for pain and swelling from surgery.  Take Xarelto for two and a half more weeks, then discontinue Xarelto. Once the patient has completed the blood thinner  regimen, then take a Baby 81 mg Aspirin daily for three more weeks.  Postoperative Constipation Protocol  Constipation - defined medically as fewer than three stools per week and severe constipation as less than one stool per week.  One of the most common issues patients have following surgery is constipation.  Even if you have a regular bowel pattern at home, your normal regimen is likely to be disrupted due to multiple reasons following surgery.  Combination of anesthesia, postoperative narcotics, change in appetite and fluid intake all can affect your bowels.  In order to avoid complications following surgery, here are some recommendations in order to help you during your recovery period.  Colace (docusate) - Pick up an over-the-counter form of Colace or another stool softener and take twice a day as long as you are requiring postoperative pain medications.  Take with a full glass of water daily.  If you experience loose stools or diarrhea, hold the colace until you stool forms back up.  If your symptoms do  not get better within 1 week or if they get worse, check with your doctor.  Dulcolax (bisacodyl) - Pick up over-the-counter and take as directed by the product packaging as needed to assist with the movement of your bowels.  Take with a full glass of water.  Use this product as needed if not relieved by Colace only.   MiraLax (polyethylene glycol) - Pick up over-the-counter to have on hand.  MiraLax is a solution that will increase the amount of water in your bowels to assist with bowel movements.  Take as directed and can mix with a glass of water, juice, soda, coffee, or tea.  Take if you go more than two days without a movement. Do not use MiraLax more than once per day. Call your doctor if you are still constipated or irregular after using this medication for 7 days in a row.  If you continue to have problems with postoperative constipation, please contact the office for further assistance  and recommendations.  If you experience "the worst abdominal pain ever" or develop nausea or vomiting, please contact the office immediatly for further recommendations for treatment.  Information on my medicine - XARELTO (Rivaroxaban)  This medication education was reviewed with me or my healthcare representative as part of my discharge preparation.  The pharmacist that spoke with me during my hospital stay was:  Minda Ditto, [facility]  Why was Xarelto prescribed for you? Xarelto was prescribed for you to reduce the risk of blood clots forming after orthopedic surgery. The medical term for these abnormal blood clots is venous thromboembolism (VTE).  What do you need to know about xarelto ? Take your Xarelto ONCE DAILY at the same time every day. You may take it either with or without food.  If you have difficulty swallowing the tablet whole, you may crush it and mix in applesauce just prior to taking your dose.  Take Xarelto exactly as prescribed by your doctor and DO NOT stop taking Xarelto without talking to the doctor who prescribed the medication.  Stopping without other VTE prevention medication to take the place of Xarelto may increase your risk of developing a clot.  After discharge, you should have regular check-up appointments with your healthcare provider that is prescribing your Xarelto.    What do you do if you miss a dose? If you miss a dose, take it as soon as you remember on the same day then continue your regularly scheduled once daily regimen the next day. Do not take two doses of Xarelto on the same day.   Important Safety Information A possible side effect of Xarelto is bleeding. You should call your healthcare provider right away if you experience any of the following: ? Bleeding from an injury or your nose that does not stop. ? Unusual colored urine (red or dark brown) or unusual colored stools (red or black). ? Unusual bruising for unknown reasons. ? A serious  fall or if you hit your head (even if there is no bleeding).  Some medicines may interact with Xarelto and might increase your risk of bleeding while on Xarelto. To help avoid this, consult your healthcare provider or pharmacist prior to using any new prescription or non-prescription medications, including herbals, vitamins, non-steroidal anti-inflammatory drugs (NSAIDs) and supplements.  This website has more information on Xarelto: [email or web address]

## 2014-09-08 LAB — CBC
HCT: 32.8 % — ABNORMAL LOW (ref 39.0–52.0)
Hemoglobin: 11.4 g/dL — ABNORMAL LOW (ref 13.0–17.0)
MCH: 32.5 pg (ref 26.0–34.0)
MCHC: 34.8 g/dL (ref 30.0–36.0)
MCV: 93.4 fL (ref 78.0–100.0)
Platelets: 172 10*3/uL (ref 150–400)
RBC: 3.51 MIL/uL — ABNORMAL LOW (ref 4.22–5.81)
RDW: 12.5 % (ref 11.5–15.5)
WBC: 9.1 10*3/uL (ref 4.0–10.5)

## 2014-09-08 LAB — BASIC METABOLIC PANEL
Anion gap: 10 (ref 5–15)
BUN: 9 mg/dL (ref 6–23)
CO2: 27 mEq/L (ref 19–32)
Calcium: 8.7 mg/dL (ref 8.4–10.5)
Chloride: 99 mEq/L (ref 96–112)
Creatinine, Ser: 0.74 mg/dL (ref 0.50–1.35)
GFR calc Af Amer: >90 mL/min (ref >90)
GFR calc non Af Amer: >90 mL/min (ref >90)
Glucose, Bld: 131 mg/dL — ABNORMAL HIGH (ref 70–99)
Potassium: 4 mEq/L (ref 3.7–5.3)
Sodium: 136 mEq/L — ABNORMAL LOW (ref 137–147)

## 2014-09-08 NOTE — Progress Notes (Signed)
Physical Therapy Treatment Patient Details Name: RAMIREZ FULLBRIGHT MRN: [id number] DOB: Jan 02, 1956 Today's Date: 09/08/2014    History of Present Illness 58 yo male s/p R TKA 09/06/14.     PT Comments    Progressing well with mobility. Pt continues to need cues for safety, pacing-tends to move too quickly at times. Practiced exercises, ambulation, and stair negotiation. All education completed. Ready to d/c from PT standpoint.   Follow Up Recommendations  Home health PT     Equipment Recommendations  None recommended by PT    Recommendations for Other Services OT consult     Precautions / Restrictions Precautions Precautions: Knee;Fall Required Braces or Orthoses: Knee Immobilizer - Right Knee Immobilizer - Right: Discontinue once straight leg raise with < 10 degree lag Restrictions Weight Bearing Restrictions: No RLE Weight Bearing: Weight bearing as tolerated    Mobility  Bed Mobility   Bed Mobility: Supine to Sit;Sit to Supine     Supine to sit: Min assist Sit to supine: Min assist   General bed mobility comments: assist for R LE  Transfers Overall transfer level: Needs assistance Equipment used: Rolling walker (2 wheeled) Transfers: Sit to/from Stand Sit to Stand: Min guard         General transfer comment: close guard for safety. VCs safety, hand placement  Ambulation/Gait Ambulation/Gait assistance: Min guard Ambulation Distance (Feet): 300 Feet Assistive device: Rolling walker (2 wheeled) Gait Pattern/deviations: Step-to pattern;Antalgic     General Gait Details: vcs for pacing   Stairs Stairs: Yes Stairs assistance: Min guard Stair Management: Step to pattern;Forwards;With crutches;One rail Left Number of Stairs: 4 General stair comments: close guard for safety. VCS safety.  Wheelchair Mobility    Modified Rankin (Stroke Patients Only)       Balance                                    Cognition Arousal/Alertness:  Awake/alert Behavior During Therapy: WFL for tasks assessed/performed Overall Cognitive Status: Within Functional Limits for tasks assessed                      Exercises Total Joint Exercises Ankle Circles/Pumps: AROM;Both;10 reps;Supine Quad Sets: AROM;Both;10 reps;Supine Heel Slides: AAROM;Right;10 reps;Supine (ROM limited due to pain) Hip ABduction/ADduction: AAROM;Right;10 reps;Supine Straight Leg Raises: AAROM;Right;10 reps;Supine Goniometric ROM: 10-35 degrees. ROM limited today due to pain    General Comments        Pertinent Vitals/Pain Pain Assessment: 0-10 Pain Score: 7  Pain Location: R thigh area Pain Intervention(s): Monitored during session;Premedicated before session;Ice applied    Home Living                      Prior Function            PT Goals (current goals can now be found in the care plan section) Progress towards PT goals: Progressing toward goals    Frequency  7X/week    PT Plan Current plan remains appropriate    Co-evaluation             End of Session Equipment Utilized During Treatment: Gait belt Activity Tolerance: Patient tolerated treatment well Patient left: in bed;with call bell/phone within reach     Time: 1022-1041 PT Time Calculation (min) (ACUTE ONLY): 19 min  Charges:  $Gait Training: 8-22 mins  G Codes:      Weston Anna, MPT Pager: [phone]

## 2014-09-08 NOTE — Progress Notes (Signed)
   Subjective: 2 Days Post-Op Procedure(s) (LRB): RIGHT TOTAL KNEE ARTHROPLASTY (Right) Patient reports pain as mild.   Patient is ready to go home  Objective: Vital signs in last 24 hours: Temp:  [98.1 F (36.7 C)-98.7 F (37.1 C)] 98.7 F (37.1 C) (11/18 0525) Pulse Rate:  [64-72] 72 (11/18 0525) Resp:  [12-16] 12 (11/18 0525) BP: (113-136)/(66-75) 116/70 mmHg (11/18 0525) SpO2:  [96 %-100 %] 99 % (11/18 0525)  Intake/Output from previous day:  Intake/Output Summary (Last 24 hours) at 09/08/14 0659 Last data filed at 09/08/14 0525  Gross per 24 hour  Intake    820 ml  Output   2400 ml  Net  -1580 ml    Intake/Output this shift: Total I/O In: 100 [P.O.:100] Out: 0   Labs:  Recent Labs  09/07/14 0458 09/08/14 0427  HGB 12.7* 11.4*    Recent Labs  09/07/14 0458 09/08/14 0427  WBC 11.5* 9.1  RBC 3.94* 3.51*  HCT 36.7* 32.8*  PLT 175 172    Recent Labs  09/07/14 0458 09/08/14 0427  NA 133* 136*  K 4.0 4.0  CL 96 99  CO2 25 27  BUN 8 9  CREATININE 0.72 0.74  GLUCOSE 169* 131*  CALCIUM 8.6 8.7   No results for input(s): LABPT, INR in the last 72 hours.  EXAM: General - Patient is Alert, Appropriate and Oriented Extremity - Neurologically intact Neurovascular intact Incision: dressing C/D/I No cellulitis present Compartment soft Incision - clean, dry, no drainage Motor Function - intact, moving foot and toes well on exam.   Assessment/Plan: 2 Days Post-Op Procedure(s) (LRB): RIGHT TOTAL KNEE ARTHROPLASTY (Right) Procedure(s) (LRB): RIGHT TOTAL KNEE ARTHROPLASTY (Right) Past Medical History  Diagnosis Date  . Arthritis   . Rosacea   . Seasonal allergies    Principal Problem:   OA (osteoarthritis) of knee  Estimated body mass index is 27.37 kg/(m^2) as calculated from the following:   Height as of this encounter: 6\' 3"  (1.905 m).   Weight as of this encounter: 219 lb (99.338 kg). Up with therapy Diet - Regular diet Follow up - in  2 weeks Activity - WBAT Disposition - Home Condition Upon Discharge - Good D/C Meds - See DC Summary DVT Prophylaxis - Xarelto  Arlee Muslim, PA-C Orthopaedic Surgery 09/08/2014, 6:59 AM

## 2014-09-08 NOTE — Discharge Summary (Signed)
Physician Discharge Summary   Patient ID: Danny Kerr MRN: [id number] DOB/AGE: [birth year] 58 y.o.  Admit date: 09/06/2014 Discharge date: 09/08/2014  Primary Diagnosis: Osteoarthritis Right knee(s)  Admission Diagnoses:  Past Medical History  Diagnosis Date  . Arthritis   . Rosacea   . Seasonal allergies    Discharge Diagnoses:   Principal Problem:   OA (osteoarthritis) of knee  Estimated body mass index is 27.37 kg/(m^2) as calculated from the following:   Height as of this encounter: 6' 3"  (1.905 m).   Weight as of this encounter: 99.338 kg (219 lb).  Procedure:  Procedure(s) (LRB): RIGHT TOTAL KNEE ARTHROPLASTY (Right)   Consults: None  HPI: Danny Kerr is a 58 y.o. year old male with end stage OA of his right knee with progressively worsening pain and dysfunction. He has constant pain, with activity and at rest and significant functional deficits with difficulties even with ADLs. He has had extensive non-op management including analgesics, injections of cortisone, and home exercise program, but remains in significant pain with significant dysfunction. Radiographs show bone on bone arthritis medial and patellofemoral with large marginal osteophytes.Marland Kitchen He presents now for rightt Total Knee Arthroplasty.   Laboratory Data: Admission on 09/06/2014, Discharged on 09/08/2014  Component Date Value Ref Range Status  . ABO/RH(D) 09/06/2014 O NEG   Final  . Antibody Screen 09/06/2014 NEG   Final  . Sample Expiration 09/06/2014 09/09/2014   Final  . ABO/RH(D) 09/06/2014 O NEG   Final  . WBC 09/07/2014 11.5* 4.0 - 10.5 K/uL Final  . RBC 09/07/2014 3.94* 4.22 - 5.81 MIL/uL Final  . Hemoglobin 09/07/2014 12.7* 13.0 - 17.0 g/dL Final  . HCT 09/07/2014 36.7* 39.0 - 52.0 % Final  . MCV 09/07/2014 93.1  78.0 - 100.0 fL Final  . MCH 09/07/2014 32.2  26.0 - 34.0 pg Final  . MCHC 09/07/2014 34.6  30.0 - 36.0 g/dL Final  . RDW 09/07/2014 12.1  11.5 - 15.5 % Final  . Platelets  09/07/2014 175  150 - 400 K/uL Final  . Sodium 09/07/2014 133* 137 - 147 mEq/L Final  . Potassium 09/07/2014 4.0  3.7 - 5.3 mEq/L Final  . Chloride 09/07/2014 96  96 - 112 mEq/L Final  . CO2 09/07/2014 25  19 - 32 mEq/L Final  . Glucose, Bld 09/07/2014 169* 70 - 99 mg/dL Final  . BUN 09/07/2014 8  6 - 23 mg/dL Final  . Creatinine, Ser 09/07/2014 0.72  0.50 - 1.35 mg/dL Final  . Calcium 09/07/2014 8.6  8.4 - 10.5 mg/dL Final  . GFR calc non Af Amer 09/07/2014 >90  >90 mL/min Final  . GFR calc Af Amer 09/07/2014 >90  >90 mL/min Final   Comment: (NOTE) The eGFR has been calculated using the CKD EPI equation. This calculation has not been validated in all clinical situations. eGFR's persistently <90 mL/min signify possible Chronic Kidney Disease.   . Anion gap 09/07/2014 12  5 - 15 Final  . WBC 09/08/2014 9.1  4.0 - 10.5 K/uL Final  . RBC 09/08/2014 3.51* 4.22 - 5.81 MIL/uL Final  . Hemoglobin 09/08/2014 11.4* 13.0 - 17.0 g/dL Final  . HCT 09/08/2014 32.8* 39.0 - 52.0 % Final  . MCV 09/08/2014 93.4  78.0 - 100.0 fL Final  . MCH 09/08/2014 32.5  26.0 - 34.0 pg Final  . MCHC 09/08/2014 34.8  30.0 - 36.0 g/dL Final  . RDW 09/08/2014 12.5  11.5 - 15.5 % Final  . Platelets 09/08/2014 172  150 - 400 K/uL Final  . Sodium 09/08/2014 136* 137 - 147 mEq/L Final  . Potassium 09/08/2014 4.0  3.7 - 5.3 mEq/L Final  . Chloride 09/08/2014 99  96 - 112 mEq/L Final  . CO2 09/08/2014 27  19 - 32 mEq/L Final  . Glucose, Bld 09/08/2014 131* 70 - 99 mg/dL Final  . BUN 09/08/2014 9  6 - 23 mg/dL Final  . Creatinine, Ser 09/08/2014 0.74  0.50 - 1.35 mg/dL Final  . Calcium 09/08/2014 8.7  8.4 - 10.5 mg/dL Final  . GFR calc non Af Amer 09/08/2014 >90  >90 mL/min Final  . GFR calc Af Amer 09/08/2014 >90  >90 mL/min Final   Comment: (NOTE) The eGFR has been calculated using the CKD EPI equation. This calculation has not been validated in all clinical situations. eGFR's persistently <90 mL/min signify  possible Chronic Kidney Disease.   Georgiann Hahn gap 09/08/2014 10  5 - 15 Final  Hospital Outpatient Visit on 08/26/2014  Component Date Value Ref Range Status  . MRSA, PCR 08/26/2014 INVALID RESULTS, SPECIMEN SENT FOR CULTURE* NEGATIVE Final   AFTER HOURS 08/26/2014 1929 BY BOVELL,T.  . Staphylococcus aureus 08/26/2014 INVALID RESULTS, SPECIMEN SENT FOR CULTURE* NEGATIVE Final   Comment:        The Xpert SA Assay (FDA approved for NASAL specimens in patients over 68 years of age), is one component of a comprehensive surveillance program.  Test performance has been validated by [employer] for patients greater than or equal to 71 year old. It is not intended to diagnose infection nor to guide or monitor treatment. AFTER HOURS 08/26/2014 1929 BY BOVELL,T.   . aPTT 08/26/2014 34  24 - 37 seconds Final  . WBC 08/26/2014 5.5  4.0 - 10.5 K/uL Final  . RBC 08/26/2014 5.05  4.22 - 5.81 MIL/uL Final  . Hemoglobin 08/26/2014 16.3  13.0 - 17.0 g/dL Final  . HCT 08/26/2014 47.3  39.0 - 52.0 % Final  . MCV 08/26/2014 93.7  78.0 - 100.0 fL Final  . MCH 08/26/2014 32.3  26.0 - 34.0 pg Final  . MCHC 08/26/2014 34.5  30.0 - 36.0 g/dL Final  . RDW 08/26/2014 12.6  11.5 - 15.5 % Final  . Platelets 08/26/2014 183  150 - 400 K/uL Final  . Sodium 08/26/2014 138  137 - 147 mEq/L Final  . Potassium 08/26/2014 4.3  3.7 - 5.3 mEq/L Final  . Chloride 08/26/2014 98  96 - 112 mEq/L Final  . CO2 08/26/2014 26  19 - 32 mEq/L Final  . Glucose, Bld 08/26/2014 98  70 - 99 mg/dL Final  . BUN 08/26/2014 9  6 - 23 mg/dL Final  . Creatinine, Ser 08/26/2014 0.76  0.50 - 1.35 mg/dL Final  . Calcium 08/26/2014 9.6  8.4 - 10.5 mg/dL Final  . Total Protein 08/26/2014 7.6  6.0 - 8.3 g/dL Final  . Albumin 08/26/2014 4.1  3.5 - 5.2 g/dL Final  . AST 08/26/2014 52* 0 - 37 U/L Final  . ALT 08/26/2014 23  0 - 53 U/L Final  . Alkaline Phosphatase 08/26/2014 75  39 - 117 U/L Final  . Total Bilirubin 08/26/2014 0.8  0.3 - 1.2  mg/dL Final  . GFR calc non Af Amer 08/26/2014 >90  >90 mL/min Final  . GFR calc Af Amer 08/26/2014 >90  >90 mL/min Final   Comment: (NOTE) The eGFR has been calculated using the CKD EPI equation. This calculation has not been validated in all  clinical situations. eGFR's persistently <90 mL/min signify possible Chronic Kidney Disease.   . Anion gap 08/26/2014 14  5 - 15 Final  . Prothrombin Time 08/26/2014 14.1  11.6 - 15.2 seconds Final  . INR 08/26/2014 1.08  0.00 - 1.49 Final  . Color, Urine 08/26/2014 YELLOW  YELLOW Final  . APPearance 08/26/2014 CLEAR  CLEAR Final  . Specific Gravity, Urine 08/26/2014 1.018  1.005 - 1.030 Final  . pH 08/26/2014 5.5  5.0 - 8.0 Final  . Glucose, UA 08/26/2014 NEGATIVE  NEGATIVE mg/dL Final  . Hgb urine dipstick 08/26/2014 NEGATIVE  NEGATIVE Final  . Bilirubin Urine 08/26/2014 NEGATIVE  NEGATIVE Final  . Ketones, ur 08/26/2014 NEGATIVE  NEGATIVE mg/dL Final  . Protein, ur 08/26/2014 NEGATIVE  NEGATIVE mg/dL Final  . Urobilinogen, UA 08/26/2014 0.2  0.0 - 1.0 mg/dL Final  . Nitrite 08/26/2014 NEGATIVE  NEGATIVE Final  . Leukocytes, UA 08/26/2014 NEGATIVE  NEGATIVE Final   MICROSCOPIC NOT DONE ON URINES WITH NEGATIVE PROTEIN, BLOOD, LEUKOCYTES, NITRITE, OR GLUCOSE <1000 mg/dL.  Marland Kitchen Specimen Description 08/26/2014 NOSE   Final  . Special Requests 08/26/2014 NONE   Final  . Culture 08/26/2014    Final                   Value:NO STAPHYLOCOCCUS AUREUS ISOLATED Note: No MRSA Isolated Performed at [facility]   . Report Status 08/26/2014 08/30/2014 FINAL   Final     X-Rays:No results found.  EKG:No orders found for this or any previous visit.   Hospital Course: Danny Kerr is a 58 y.o. who was admitted to [facility]. They were brought to the operating room on 09/06/2014 and underwent Procedure(s): RIGHT TOTAL KNEE ARTHROPLASTY.  Patient tolerated the procedure well and was later transferred to the recovery room and then to the  orthopaedic floor for postoperative care.  They were given PO and IV analgesics for pain control following their surgery.  They were given 24 hours of postoperative antibiotics of  Anti-infectives    Start     Dose/Rate Route Frequency Ordered Stop   09/06/14 2000  ceFAZolin (ANCEF) IVPB 2 g/50 mL premix     2 g100 mL/hr over 30 Minutes Intravenous Every 6 hours 09/06/14 1724 09/07/14 0236   09/06/14 1102  ceFAZolin (ANCEF) IVPB 2 g/50 mL premix     2 g100 mL/hr over 30 Minutes Intravenous On call to O.R. 09/06/14 1102 09/06/14 1352     and started on DVT prophylaxis in the form of Xarelto.   PT and OT were ordered for total joint protocol.  Discharge planning consulted to help with postop disposition and equipment needs.  Patient had a decent night on the evening of surgery.  They started to get up OOB with therapy on day one. Hemovac drain was pulled without difficulty.  Continued to work with therapy into day two.  Dressing was changed on day two and the incision was healing well. Patient was seen in rounds and was ready to go home.   Diet - Regular diet Follow up - in 2 weeks Activity - WBAT Disposition - Home Condition Upon Discharge - Good D/C Meds - See DC Summary DVT Prophylaxis - Xarelto      Discharge Instructions    Call MD / Call 911    Complete by:  As directed   If you experience chest pain or shortness of breath, CALL 911 and be transported to the hospital emergency room.  If  you develope a fever above 101 F, pus (white drainage) or increased drainage or redness at the wound, or calf pain, call your surgeon's office.     Change dressing    Complete by:  As directed   Change dressing daily with sterile 4 x 4 inch gauze dressing and apply TED hose. Do not submerge the incision under water.     Constipation Prevention    Complete by:  As directed   Drink plenty of fluids.  Prune juice may be helpful.  You may use a stool softener, such as Colace (over the counter) 100 mg  twice a day.  Use MiraLax (over the counter) for constipation as needed.     Diet general    Complete by:  As directed      Discharge instructions    Complete by:  As directed   Pick up stool softner and laxative for home. Do not submerge incision under water. May shower. Continue to use ice for pain and swelling from surgery.  Take Xarelto for two and a half more weeks, then discontinue Xarelto. Once the patient has completed the blood thinner regimen, then take a Baby 81 mg Aspirin daily for three more weeks.  Postoperative Constipation Protocol  Constipation - defined medically as fewer than three stools per week and severe constipation as less than one stool per week.  One of the most common issues patients have following surgery is constipation.  Even if you have a regular bowel pattern at home, your normal regimen is likely to be disrupted due to multiple reasons following surgery.  Combination of anesthesia, postoperative narcotics, change in appetite and fluid intake all can affect your bowels.  In order to avoid complications following surgery, here are some recommendations in order to help you during your recovery period.  Colace (docusate) - Pick up an over-the-counter form of Colace or another stool softener and take twice a day as long as you are requiring postoperative pain medications.  Take with a full glass of water daily.  If you experience loose stools or diarrhea, hold the colace until you stool forms back up.  If your symptoms do not get better within 1 week or if they get worse, check with your doctor.  Dulcolax (bisacodyl) - Pick up over-the-counter and take as directed by the product packaging as needed to assist with the movement of your bowels.  Take with a full glass of water.  Use this product as needed if not relieved by Colace only.   MiraLax (polyethylene glycol) - Pick up over-the-counter to have on hand.  MiraLax is a solution that will increase the amount of  water in your bowels to assist with bowel movements.  Take as directed and can mix with a glass of water, juice, soda, coffee, or tea.  Take if you go more than two days without a movement. Do not use MiraLax more than once per day. Call your doctor if you are still constipated or irregular after using this medication for 7 days in a row.  If you continue to have problems with postoperative constipation, please contact the office for further assistance and recommendations.  If you experience "the worst abdominal pain ever" or develop nausea or vomiting, please contact the office immediatly for further recommendations for treatment.     Do not put a pillow under the knee. Place it under the heel.    Complete by:  As directed      Do not sit on  low chairs, stoools or toilet seats, as it may be difficult to get up from low surfaces    Complete by:  As directed      Driving restrictions    Complete by:  As directed   No driving until released by the physician.     Increase activity slowly as tolerated    Complete by:  As directed      Lifting restrictions    Complete by:  As directed   No lifting until released by the physician.     Patient may shower    Complete by:  As directed   You may shower without a dressing once there is no drainage.  Do not wash over the wound.  If drainage remains, do not shower until drainage stops.     TED hose    Complete by:  As directed   Use stockings (TED hose) for 3 weeks on both leg(s).  You may remove them at night for sleeping.     Weight bearing as tolerated    Complete by:  As directed             Medication List    TAKE these medications        doxycycline 100 MG tablet  Commonly known as:  ADOXA  Take 100 mg by mouth 2 (two) times daily.     methocarbamol 500 MG tablet  Commonly known as:  ROBAXIN  Take 1 tablet (500 mg total) by mouth every 6 (six) hours as needed for muscle spasms.     oxyCODONE 5 MG immediate release tablet  Commonly  known as:  Oxy IR/ROXICODONE  Take 1-2 tablets (5-10 mg total) by mouth every 3 (three) hours as needed for moderate pain, severe pain or breakthrough pain.     rivaroxaban 10 MG Tabs tablet  Commonly known as:  XARELTO  - Take 1 tablet (10 mg total) by mouth daily with breakfast. Take Xarelto for two and a half more weeks, then discontinue Xarelto.  - Once the patient has completed the blood thinner regimen, then take a Baby 81 mg Aspirin daily for three more weeks.     traMADol 50 MG tablet  Commonly known as:  ULTRAM  Take 1-2 tablets (50-100 mg total) by mouth every 6 (six) hours as needed (mild pain).       Follow-up Information    Follow up with [facility].   Why:  home health physical therapy   Contact information:   [address] [place] [address] [phone]       Follow up with Gearlean Alf, MD. Schedule an appointment as soon as possible for a visit on 09/21/2014.   Specialty:  Orthopedic Surgery   Why:  Call office at [phone] to setup appointment on Tuesday 09/21/2014   Contact information:   [address] [place] [address] [phone]       Signed: Arlee Muslim, PA-C Orthopaedic Surgery 09/09/2014, 10:10 AM

## 2016-01-09 ENCOUNTER — Ambulatory Visit (HOSPITAL_COMMUNITY)
Admission: RE | Admit: 2016-01-09 | Discharge: 2016-01-09 | Disposition: A | Discharge status: Home / Self Care | Payer: 59 | Source: Ambulatory Visit | Attending: Cardiovascular Disease | Admitting: Cardiovascular Disease

## 2016-01-09 ENCOUNTER — Other Ambulatory Visit (HOSPITAL_COMMUNITY): Payer: Self-pay | Admitting: Sports Medicine

## 2016-01-09 DIAGNOSIS — R52 Pain, unspecified: Secondary | ICD-10-CM

## 2016-01-09 DIAGNOSIS — M7989 Other specified soft tissue disorders: Secondary | ICD-10-CM | POA: Diagnosis not present

## 2016-01-09 DIAGNOSIS — M79604 Pain in right leg: Secondary | ICD-10-CM | POA: Insufficient documentation

## 2016-12-04 DIAGNOSIS — Z0000 Encounter for general adult medical examination without abnormal findings: Secondary | ICD-10-CM | POA: Diagnosis not present

## 2016-12-11 DIAGNOSIS — R030 Elevated blood-pressure reading, without diagnosis of hypertension: Secondary | ICD-10-CM | POA: Diagnosis not present

## 2016-12-11 DIAGNOSIS — R9431 Abnormal electrocardiogram [ECG] [EKG]: Secondary | ICD-10-CM | POA: Diagnosis not present

## 2016-12-11 DIAGNOSIS — E784 Other hyperlipidemia: Secondary | ICD-10-CM | POA: Diagnosis not present

## 2016-12-11 DIAGNOSIS — Z1389 Encounter for screening for other disorder: Secondary | ICD-10-CM | POA: Diagnosis not present

## 2016-12-11 DIAGNOSIS — Z0000 Encounter for general adult medical examination without abnormal findings: Secondary | ICD-10-CM | POA: Diagnosis not present

## 2017-03-25 ENCOUNTER — Encounter (INDEPENDENT_AMBULATORY_CARE_PROVIDER_SITE_OTHER): Payer: Self-pay

## 2017-03-25 ENCOUNTER — Encounter: Payer: Self-pay | Admitting: Neurology

## 2017-03-25 ENCOUNTER — Ambulatory Visit (INDEPENDENT_AMBULATORY_CARE_PROVIDER_SITE_OTHER): Payer: 59 | Admitting: Neurology

## 2017-03-25 ENCOUNTER — Telehealth: Payer: Self-pay | Admitting: Neurology

## 2017-03-25 VITALS — BP 166/98 | HR 96 | Resp 20 | Ht 74.0 in | Wt 228.0 lb

## 2017-03-25 DIAGNOSIS — M6258 Muscle wasting and atrophy, not elsewhere classified, other site: Secondary | ICD-10-CM | POA: Diagnosis not present

## 2017-03-25 DIAGNOSIS — R253 Fasciculation: Secondary | ICD-10-CM

## 2017-03-25 DIAGNOSIS — E559 Vitamin D deficiency, unspecified: Secondary | ICD-10-CM | POA: Diagnosis not present

## 2017-03-25 NOTE — Addendum Note (Signed)
Addended by: Larey Seat on: 03/25/2017 03:02 PM   Modules accepted: Orders

## 2017-03-25 NOTE — Telephone Encounter (Signed)
Approved - authorization valid until 719. Authorization number is [id number]

## 2017-03-25 NOTE — Telephone Encounter (Signed)
[employer] did not approve the MRI. Needing more clinical information. The phone number for the peer to peer is [phone]. The case number is [id number].

## 2017-03-25 NOTE — Progress Notes (Signed)
[facility]   Provider:  Larey Seat, M D  Primary Care Physician:  Velna Hatchet, MD   Referring Provider: Velna Hatchet, MD    Chief Complaint  Patient presents with  . New Patient (Initial Visit)    biceps spasm, atrophy of L arm    HPI:  Danny Kerr is a 61 y.o. male , seen here as in a referral  from Dr. Ardeth Perfect for   Chief complaint according to patient : Danny Kerr reports that over the last 3 or 4 years he has noticed fasciculation in his left biceps and some atrophy for one head of the biceps.  Sometimes he has a tingling fasciculation at the left neck right between the platysma and skeletal muscles. The same on the top of the left shoulder, feeling like a muscle flutter to him. He has not lost motor strength, there is no associated pain there is no range of motion restriction. He has no presentation of the lower extremities. All symptoms have been restricted to the left neck, left shoulder and left upper arm. He spoke to his neighbor Danny Kerr about these findings will recommended that a neurologist should look at him. Since the symptoms have been well present over the last 3 years if not for and he has not experienced any muscle bulk or strength loss, I feel confident that we are not dealing with ALS. We do need to evaluate his left cervical spine nerves and I would like to evaluate him and an MRI, he would prefer an open MRI, is highly claustrophobic.   Medical history and family history: rhinophyma. Knee pain, total knee replacement 2016 by Dr. Maureen Ralphs. Fasciculations, focal.  Sister at 72 has Parkinson Disease.   Social history: ETOH, beer , vodka and wine ( 5-7 a week) no tobacco use, no drugs,  Caffeine - diet coke 3-4 a day.   Review of Systems: Out of a complete 14 system review, the patient complains of only the following symptoms, and all other reviewed systems are negative.  The patient reports that she snores, but has never been told  that he has apneic pauses. He has not noticed any strength loss. He has noticed it during exercise at the gym his left bicep sometimes cramps. He describes an almost myotonic elevation in tone.   Social History   Social History  . Marital status: Married    Spouse name: N/A  . Number of children: N/A  . Years of education: N/A   Occupational History  . Not on file.   Social History Main Topics  . Smoking status: Former Smoker    Quit date: 08/26/1989  . Smokeless tobacco: Never Used  . Alcohol use Yes     Comment: 3-4 drinks per day  . Drug use: No  . Sexual activity: Not on file   Other Topics Concern  . Not on file   Social History Narrative  . No narrative on file    Family History  Problem Relation Age of Onset  . Lung cancer Mother   . Parkinson's disease Sister     Past Medical History:  Diagnosis Date  . Arthritis   . ETOH abuse   . Rosacea   . Seasonal allergies     Past Surgical History:  Procedure Laterality Date  . KNEE ARTHROTOMY  1977   rt knee  . TOTAL KNEE ARTHROPLASTY Right 09/06/2014   Procedure: RIGHT TOTAL KNEE ARTHROPLASTY;  Surgeon: Gearlean Alf, MD;  Location: [facility];  Service: Orthopedics;  Laterality: Right;    Current Outpatient Prescriptions  Medication Sig Dispense Refill  . doxycycline (ADOXA) 100 MG tablet Take 100 mg by mouth 2 (two) times daily.     No current facility-administered medications for this visit.     Allergies as of 03/25/2017  . (No Known Allergies)    Vitals: BP (!) 166/98   Pulse 96   Resp 20   Ht 6\' 2"  (1.88 m)   Wt 228 lb (103.4 kg)   BMI 29.27 kg/m  Last Weight:  Wt Readings from Last 1 Encounters:  03/25/17 228 lb (103.4 kg)   SPQ:ZRAQ mass index is 29.27 kg/m.     Last Height:   Ht Readings from Last 1 Encounters:  03/25/17 6\' 2"  (1.88 m)    Physical exam:  General: The patient is awake, alert and appears not in acute distress. The patient is well groomed. Head: Normocephalic,  atraumatic. Neck is supple. Mallampati 2- , the patient does not present with fasciculations of the tongue, speech differences or changes, and he does not have dysphagia. neck circumference:16.5 .Cardiovascular:  Regular rate and rhythm, without  murmurs or carotid bruit, and without distended neck veins. Respiratory: Lungs are clear to auscultation. Skin:  Without evidence of edema, or rash Trunk: BMI is 29. The patient's posture is erect    Neurologic exam : The patient is awake and alert, oriented to place and time.  Attention span & concentration ability appears normal.  Speech is fluent,  Without dysarthria, dysphonia or aphasia.  Mood and affect are appropriate.  Cranial nerves: Pupils are equal and briskly reactive to light. Funduscopic exam without evidence of pallor or edema.  Extraocular movements  in vertical and horizontal planes intact and without nystagmus. Visual fields by finger perimetry are intact. Hearing to finger rub intact. Facial sensation intact to fine touch. Facial motor strength is symmetric and tongue and uvula move midline. Shoulder shrug was symmetrical.  Motor exam:   Normal tone, muscle bulk and symmetric strength in all extremities. Sensory:  Fine touch, pinprick and vibration were tested in all extremities. Proprioception tested in the upper extremities was normal. Coordination: Rapid alternating movements in the fingers/hands was normal. Finger-to-nose maneuver  normal without evidence of ataxia, dysmetria or tremor. Gait and station: Patient walks without assistive device and is able unassisted to climb up to the exam table. Strength within normal limits. Stance is stable and normal.Tandem gait is unfragmented. Turns with 3 Steps. Romberg testing is negative.  Deep tendon reflexes: in the  upper and lower extremities are symmetric and intact. Babinski maneuver response is  downgoing.    Assessment:  After physical and neurologic examination, review of  laboratory studies,  Personal review of imaging studies, and pre-existing records as far as provided in visit., my assessment is   1)  Topical Fasciculations- left shouder, neck and biceps- he has only one location of muscle atrophy which seems to affect one biceps head.   2) he also reports spasms in the biceps when lifting weights or just raising the arm above shoulder level. I will refer the patient for an EMG and nerve conduction study to localize the muscles that seem to be most affected aside from the biceps. I will order an open MRI to rule out a spinal stenosis. There is no topical medication, but since the condition is not painful and not to debilitating it may not be necessary to treat it anyway but to confirm  that this is not a progressive degenerative neurologic disorder. He shall continue Vit D supplementation. Vit B complex.    The patient was advised of the nature of the diagnosed disorder , the treatment options and the  risks for general health and wellness arising from not treating the condition.   I spent more than  45 minutes of face to face time with the patient.  Greater than 50% of time was spent in counseling and coordination of care. We have discussed the diagnosis and differential and I answered the patient's questions.    Plan:  Treatment plan and additional workup :  EMG and NCV, MRI cervical spine. C 3-5      Larey Seat, MD 10/26/8725, 6:18 PM  Certified in Neurology by ABPN Certified in [place] by [facility] [address], [place], [place] [address]

## 2017-03-26 ENCOUNTER — Telehealth: Payer: Self-pay | Admitting: *Deleted

## 2017-03-26 LAB — COMPREHENSIVE METABOLIC PANEL
ALT: 23 IU/L (ref 0–44)
AST: 53 IU/L — ABNORMAL HIGH (ref 0–40)
Albumin/Globulin Ratio: 2 (ref 1.2–2.2)
Albumin: 4.8 g/dL (ref 3.6–4.8)
Alkaline Phosphatase: 81 IU/L (ref 39–117)
BUN/Creatinine Ratio: 14 (ref 10–24)
BUN: 11 mg/dL (ref 8–27)
Bilirubin Total: 0.8 mg/dL (ref 0.0–1.2)
CO2: 28 mmol/L (ref 18–29)
Calcium: 9.8 mg/dL (ref 8.6–10.2)
Chloride: 99 mmol/L (ref 96–106)
Creatinine, Ser: 0.79 mg/dL (ref 0.76–1.27)
GFR calc Af Amer: 112 mL/min/{1.73_m2} (ref >59)
GFR calc non Af Amer: 97 mL/min/{1.73_m2} (ref >59)
Globulin, Total: 2.4 g/dL (ref 1.5–4.5)
Glucose: 107 mg/dL — ABNORMAL HIGH (ref 65–99)
Potassium: 4.4 mmol/L (ref 3.5–5.2)
Sodium: 143 mmol/L (ref 134–144)
Total Protein: 7.2 g/dL (ref 6.0–8.5)

## 2017-03-26 LAB — CBC WITH DIFFERENTIAL/PLATELET
Basophils Absolute: 0 10*3/uL (ref 0.0–0.2)
Basos: 1 %
EOS (ABSOLUTE): 0.1 10*3/uL (ref 0.0–0.4)
Eos: 2 %
Hematocrit: 48.4 % (ref 37.5–51.0)
Hemoglobin: 16.8 g/dL (ref 13.0–17.7)
Immature Grans (Abs): 0 10*3/uL (ref 0.0–0.1)
Immature Granulocytes: 0 %
Lymphocytes Absolute: 1.4 10*3/uL (ref 0.7–3.1)
Lymphs: 24 %
MCH: 32.6 pg (ref 26.6–33.0)
MCHC: 34.7 g/dL (ref 31.5–35.7)
MCV: 94 fL (ref 79–97)
Monocytes Absolute: 0.6 10*3/uL (ref 0.1–0.9)
Monocytes: 10 %
Neutrophils Absolute: 3.7 10*3/uL (ref 1.4–7.0)
Neutrophils: 63 %
Platelets: 209 10*3/uL (ref 150–379)
RBC: 5.15 x10E6/uL (ref 4.14–5.80)
RDW: 13.8 % (ref 12.3–15.4)
WBC: 5.8 10*3/uL (ref 3.4–10.8)

## 2017-03-26 LAB — VITAMIN D 25 HYDROXY (VIT D DEFICIENCY, FRACTURES): Vit D, 25-Hydroxy: 56.4 ng/mL (ref 30.0–100.0)

## 2017-03-26 NOTE — Telephone Encounter (Signed)
Noted, thank you

## 2017-03-26 NOTE — Telephone Encounter (Signed)
-----   Message from Larey Seat, MD sent at 03/26/2017  9:51 AM EDT ----- Dear Danny Kerr,  your metabolic panel was normal for a non fast test. AST 53 elevated, that is a liver enzyme and can be elevated due to tylenol intake, alcohol. Etc.  You have anemia ! - did you have blood loss due to a trauma, surgery recently? these results will be shared with your primary physician, Dr Ardeth Perfect.  Your Vitamin D level is now normal.  Larey Seat, MD

## 2017-03-26 NOTE — Telephone Encounter (Signed)
Spoke to pt and relayed the results of labs.  He verbalized understanding.  Forwarded to Dr. Ardeth Perfect.

## 2017-03-28 ENCOUNTER — Ambulatory Visit (INDEPENDENT_AMBULATORY_CARE_PROVIDER_SITE_OTHER): Payer: 59 | Admitting: Diagnostic Neuroimaging

## 2017-03-28 ENCOUNTER — Encounter (INDEPENDENT_AMBULATORY_CARE_PROVIDER_SITE_OTHER): Payer: Self-pay

## 2017-03-28 DIAGNOSIS — M6258 Muscle wasting and atrophy, not elsewhere classified, other site: Secondary | ICD-10-CM

## 2017-03-28 DIAGNOSIS — R253 Fasciculation: Secondary | ICD-10-CM

## 2017-03-28 DIAGNOSIS — Z0289 Encounter for other administrative examinations: Secondary | ICD-10-CM

## 2017-04-01 NOTE — Procedures (Signed)
[facility]  NCS (NERVE CONDUCTION STUDY) WITH EMG (ELECTROMYOGRAPHY) REPORT   STUDY DATE: 03/28/17 PATIENT NAME: Danny Kerr DOB: [birth year] MRN: [id number]  ORDERING CLINICIAN: Larey Seat, MD   TECHNOLOGIST: Shawn Stall handy ELECTROMYOGRAPHER: Earlean Polka. Catalino Plascencia, MD  CLINICAL INFORMATION: 61 year old male with left arm atrophy (left deltoid, left biceps) and fasciculations (left deltoid). Patient reports intermittent tingling in left neck and left shoulder. Symptoms stable over last 3 years.    FINDINGS: NERVE CONDUCTION STUDY: Left median and left ulnar motor responses are normal. Left ulnar F wave latency is normal.  Left axillary motor response has slightly prolonged distal latency compared to the right side. Left axillary motor response has significant decreased amplitude compared to the right side.  Left median and left radial sensory responses are normal.  Left ulnar sensory response has normal peak latency and borderline decreased amplitude.    NEEDLE ELECTROMYOGRAPHY:  Needle examination of left upper extremity (rhomboid, supraspinatus, infraspinatus, deltoid, biceps, triceps, brachioradialis, flexor carpi radialis, first dorsal interosseous) is notable for abnormal spontaneous activity in left deltoid (1+ fibrillation, 1+ positive sharp wave, occasional fasciculations) and the left biceps (1+ fibrillation) at rest. Decreased motor unit recruitment with large motor units noted in the left deltoid left biceps. Decreased motor unit recruitment with normal sized motor units noted in the left brachial radialis, supraspinatus and infraspinatus. Left cervical paraspinal muscles are normal.   IMPRESSION:  Abnormal study demonstrating: - Asymmetrically reduced left axillary motor nerve response. - Active and chronic denervation noted in multiple C5, C6 innervated muscles. 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 Correlate clinically and with neuroimaging studies.    INTERPRETING PHYSICIAN:  Penni Bombard, MD Certified in Neurology, Neurophysiology and Neuroimaging  [facility] [address] [place], [place] [address] [phone]   [facility]    Nerve / Sites Rec. Site Latency Ref. Amplitude Ref. Rel Amp Segments Distance Velocity Ref. Area    ms ms mV mV %  cm m/s m/s mVms  L Median - APB     Wrist APB 3.5 ?4.4 4.9 ?4.0 100 Wrist - APB 7   18.6     Upper arm APB 8.5  4.6  93.5 Upper arm - Wrist 27 53 ?49 18.1  L Ulnar - ADM     Wrist ADM 2.5 ?3.3 8.5 ?6.0 100 Wrist - ADM 7   28.4     B.Elbow ADM 6.9  8.9  106 B.Elbow - Wrist 23 53 ?49 30.2     A.Elbow ADM 8.6  7.8  87.7 A.Elbow - B.Elbow 10 56 ?49 26.7     Axilla ADM 11.9  7.7  98 Axilla - A.Elbow 17 52  26.4         A.Elbow - Wrist      L Axillary - Deltoid     Supraclav fossa Deltoid 5.1  3.2  100     17.9  R Axillary - Deltoid     Supraclav fossa Deltoid 4.2  18.1  100     99.6             SNC    Nerve / Sites Rec. Site Peak Lat Ref.  Amp Ref. Segments Distance    ms ms V V  cm  L Radial - Anatomical snuff box (Forearm)     Forearm Wrist 2.86 ?2.90 15 ?15 Forearm - Wrist 10  L Median - Orthodromic (  Dig II, Mid palm)     Dig II Wrist 2.97 ?3.40 13 ?10 Dig II - Wrist 13  L Ulnar - Orthodromic, (Dig V, Mid palm)     Dig V Wrist 2.45 ?3.10 4 ?5 Dig V - Wrist 40            F  Wave    Nerve F Lat Ref.   ms ms  L Ulnar - ADM 30.0 ?32.0        EMG full       EMG Summary Table    Spontaneous MUAP Recruitment  Muscle IA Fib PSW Fasc Other Amp Dur. Poly Pattern  L. Deltoid Normal 1+ 1+ Occasional _______ Increased Normal Normal Reduced  L. Biceps brachii Normal 1+ None None _______ Increased Normal Normal Reduced  L. Triceps brachii Normal None None None _______ Normal Normal Normal Normal  L. Brachioradialis Normal None None None _______ Normal Normal Normal Reduced  L.  Flexor carpi radialis Normal None None None _______ Normal Normal Normal Normal  L. First dorsal interosseous Normal None None None _______ Normal Normal Normal Normal  L. Cervical paraspinals Normal None None None _______ Normal Normal Normal Normal  L. Rhomboid major Normal None None None _______ Normal Normal Normal Normal  L. Supraspinatus Normal None None None _______ Normal Normal Normal Reduced  L. Infraspinatus Normal None None None _______ Normal Normal Normal Reduced

## 2017-04-02 ENCOUNTER — Telehealth: Payer: Self-pay | Admitting: *Deleted

## 2017-04-02 NOTE — Telephone Encounter (Signed)
-----   Message from Melvenia Beam, MD sent at 04/01/2017  5:13 PM EDT ----- Danny Kerr, emg/ncs shows possible left c6 pinched nerve in the neck. Patient has an MRI cervical spine ordered we will see what the results show. thanks

## 2017-04-02 NOTE — Telephone Encounter (Signed)
Called and spoke with patient about results per AA,MD note. Patient verified he is scheduled for MRI on 04/10/17.

## 2017-04-10 ENCOUNTER — Ambulatory Visit
Admission: RE | Admit: 2017-04-10 | Discharge: 2017-04-10 | Disposition: A | Discharge status: Home / Self Care | Payer: 59 | Source: Ambulatory Visit | Attending: Neurology | Admitting: Neurology

## 2017-04-10 DIAGNOSIS — M6258 Muscle wasting and atrophy, not elsewhere classified, other site: Secondary | ICD-10-CM

## 2017-04-10 DIAGNOSIS — R253 Fasciculation: Secondary | ICD-10-CM

## 2017-04-11 ENCOUNTER — Telehealth: Payer: Self-pay | Admitting: Neurology

## 2017-04-11 DIAGNOSIS — R94131 Abnormal electromyogram [EMG]: Secondary | ICD-10-CM

## 2017-04-11 DIAGNOSIS — M62522 Muscle wasting and atrophy, not elsewhere classified, left upper arm: Secondary | ICD-10-CM

## 2017-04-11 DIAGNOSIS — R253 Fasciculation: Secondary | ICD-10-CM

## 2017-04-11 DIAGNOSIS — M5412 Radiculopathy, cervical region: Secondary | ICD-10-CM

## 2017-04-11 NOTE — Telephone Encounter (Signed)
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 When you get a chance can you please put a new MR Cervical spine w/wo contrast order in. He was scheduled to have it yesterday but he was unable to have it because it the machine was too tight and he wants to go to [facility] for the open one. But since they canceled the appointment the order is still linked to that appointment and I'll need a new order to fax it to [facility]. Thank you for your help.

## 2017-04-11 NOTE — Telephone Encounter (Signed)
Noted, thank you

## 2017-04-11 NOTE — Telephone Encounter (Signed)
New MRI order in due to change in performing location/fim

## 2017-07-04 ENCOUNTER — Encounter: Payer: Self-pay | Admitting: Neurology

## 2017-07-04 ENCOUNTER — Ambulatory Visit (INDEPENDENT_AMBULATORY_CARE_PROVIDER_SITE_OTHER): Payer: 59 | Admitting: Neurology

## 2017-07-04 VITALS — BP 130/94 | HR 76 | Ht 75.0 in | Wt 220.0 lb

## 2017-07-04 DIAGNOSIS — M50122 Cervical disc disorder at C5-C6 level with radiculopathy: Secondary | ICD-10-CM | POA: Diagnosis not present

## 2017-07-04 MED ORDER — ALPRAZOLAM 0.5 MG PO TABS: 0.5000 mg | ORAL_TABLET | Freq: Every evening | ORAL | 0 refills | Status: DC | PRN

## 2017-07-04 NOTE — Progress Notes (Signed)
[facility]INE [facility]   Provider:  Larey Seat, M D  Primary Care Physician:  Danny Hatchet, MD   Referring Provider: Velna Hatchet, MD    Chief Complaint  Patient presents with  . Follow-up    pt alone, room 11, presents today for the test results of EMG.     HPI:  Danny Kerr is a 61 y.o. male , seen here in a RV on  07-04-2017,  Patient as unable to tolerate the MRI, EMG and NCV has been tolerated. Patient with chronic fasciculations over 4-5 years and no loss of strength, dysphagia and no dysarthria. His friend Danny [place], MD, a retired Publishing rights manager had a look at him as well. His assessment was also that this is not a manifestation of ALS. 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  We will order a CT neck spine.           Chief complaint according to patient : Danny Kerr reports that over the last 3 or 4 years he has noticed fasciculation in his left biceps and some atrophy for one head of the biceps.  Sometimes he has a tingling fasciculation at the left neck right between the platysma and skeletal muscles. The same on the top of the left shoulder, feeling like a muscle flutter to him. He has not lost motor strength, there is no associated pain there is no range of motion restriction. He has no presentation of the lower extremities. All symptoms have been restricted to the left neck, left shoulder and left upper arm. He spoke to his neighbor Danny Kerr about these findings will recommended that a neurologist should look at him. Since the symptoms have been well present over the last 3 years if not for and he has not experienced any muscle bulk or strength loss, I feel confident that we are not dealing with ALS. We do need to evaluate his left cervical spine  nerves and I would like to evaluate him and an MRI, he would prefer an open MRI, is highly claustrophobic.   Medical history and family history: rhinophyma. Knee pain, total knee replacement 2016 by Dr. Maureen Kerr. Fasciculations, focal.  Sister at 18 has Parkinson Disease.   Social history: ETOH, beer , vodka and wine ( 5-7 a week) no tobacco use, no drugs,  Caffeine - diet coke 3-4 a day.   Review of Systems: Out of a complete 14 system review, the patient complains of only the following symptoms, and all other reviewed systems are negative.  The patient reports that she snores, but has never been told that he has apneic pauses. He has not noticed any strength loss. He has noticed it during exercise at the gym his left bicep sometimes cramps. He describes an almost myotonic elevation in tone.   Social History   Social History  . Marital status: Married    Spouse name: N/A  . Number of children: N/A  . Years of education: N/A   Occupational History  . Not on file.   Social History Main Topics  . Smoking status: Former Smoker    Quit date: 08/26/1989  . Smokeless tobacco: Never Used  . Alcohol use Yes     Comment: 3-4 drinks per day  . Drug use: No  . Sexual activity:  Not on file   Other Topics Concern  . Not on file   Social History Narrative  . No narrative on file    Family History  Problem Relation Age of Onset  . Lung cancer Mother   . Parkinson's disease Sister     Past Medical History:  Diagnosis Date  . Arthritis   . ETOH abuse   . Rosacea   . Seasonal allergies     Past Surgical History:  Procedure Laterality Date  . KNEE ARTHROTOMY  1977   rt knee  . TOTAL KNEE ARTHROPLASTY Right 09/06/2014   Procedure: RIGHT TOTAL KNEE ARTHROPLASTY;  Surgeon: Gearlean Alf, MD;  Location: [facility];  Service: Orthopedics;  Laterality: Right;    Current Outpatient Prescriptions  Medication Sig Dispense Refill  . doxycycline (ADOXA) 100 MG tablet Take 100 mg by  mouth 2 (two) times daily.     No current facility-administered medications for this visit.     Allergies as of 07/04/2017  . (No Known Allergies)    Vitals: BP (!) 130/94   Pulse 76   Ht 6\' 3"  (1.905 m)   Wt 220 lb (99.8 kg)   BMI 27.50 kg/m  Last Weight:  Wt Readings from Last 1 Encounters:  07/04/17 220 lb (99.8 kg)   ZOX:WRUE mass index is 27.5 kg/m.     Last Height:   Ht Readings from Last 1 Encounters:  07/04/17 6\' 3"  (1.905 m)    Physical exam:  General: The patient is awake, alert and appears not in acute distress. The patient is well groomed. Head: Normocephalic, atraumatic. Neck is supple. Mallampati 2- , the patient does not present with fasciculations of the tongue, speech differences or changes, and he does not have dysphagia. neck circumference:16.5 .Cardiovascular:  Regular rate and rhythm, without  murmurs or carotid bruit, and without distended neck veins. Respiratory: Lungs are clear to auscultation. Skin:  Without evidence of edema, or rash Trunk: BMI is 29. The patient's posture is erect    Neurologic exam : The patient is awake and alert, oriented to place and time.  Attention span & concentration ability appears normal.  Speech is fluent,  Without dysarthria, dysphonia or aphasia.  Mood and affect are appropriate.  Cranial nerves: Pupils are equal and briskly reactive to light. Funduscopic exam without evidence of pallor or edema.  Extraocular movements  in vertical and horizontal planes intact and without nystagmus. Visual fields by finger perimetry are intact.  Facial sensation intact to fine touch. Facial motor strength is symmetric and tongue and uvula move midline. Shoulder shrug was symmetrical.  Motor exam:   Normal tone, muscle bulk and symmetric strength in all extremities. Sensory:  Fine touch, pinprick and vibration were tested in all extremities. Proprioception tested in the upper extremities was normal. Coordination: Rapid alternating  movements in the fingers/hands was normal. Finger-to-nose maneuver  normal without evidence of ataxia, dysmetria or tremor.  Deep tendon reflexes: in the  upper and lower extremities are symmetric and intact. Babinski maneuver response is  downgoing.    Assessment:  After physical and neurologic examination, review of laboratory studies,  Personal review of imaging studies, and pre-existing records as far as provided in visit., my assessment is   1)  Topical Fasciculations- left shouder, neck and biceps- he has only one location of muscle atrophy which seems to affect one biceps head, deltoid on the left- chronic atrophy.     The patient was advised of the nature of the  diagnosed disorder , the treatment options and the  risks for general health and wellness arising from not treating the condition.   I spent more than  20 minutes of face to face time with the patient. We will order a CT neck spine instead of the MRI,   Greater than 50% of time was spent in counseling and coordination of care. We have discussed the diagnosis and differential and I answered the patient's questions.    Plan:  Treatment plan and additional workup :  C 5-6 CT spine.RV prn.      Larey Seat, MD 02/01/8207, 1:38 AM  Certified in Neurology by ABPN Certified in [place] by [facility] [address] [place], [place] [address]

## 2017-07-23 ENCOUNTER — Ambulatory Visit
Admission: RE | Admit: 2017-07-23 | Discharge: 2017-07-23 | Disposition: A | Discharge status: Home / Self Care | Payer: 59 | Source: Ambulatory Visit | Attending: Neurology | Admitting: Neurology

## 2017-07-23 DIAGNOSIS — M50223 Other cervical disc displacement at C6-C7 level: Secondary | ICD-10-CM | POA: Diagnosis not present

## 2017-07-23 DIAGNOSIS — M50122 Cervical disc disorder at C5-C6 level with radiculopathy: Secondary | ICD-10-CM

## 2017-07-26 ENCOUNTER — Other Ambulatory Visit: Payer: Self-pay | Admitting: *Deleted

## 2017-07-26 ENCOUNTER — Telehealth: Payer: Self-pay | Admitting: *Deleted

## 2017-07-26 DIAGNOSIS — M4802 Spinal stenosis, cervical region: Secondary | ICD-10-CM

## 2017-07-26 NOTE — Telephone Encounter (Signed)
Patient called back and stated that he wants to proceed with a direct referral to Dr. Saintclair Halsted at [facility]. Dr. Dyanne Iha made aware. Referral will be ordered.

## 2017-07-26 NOTE — Telephone Encounter (Signed)
I called patient to make him aware of the CT results. Patient verbalized understanding and before he agrees with neurosurgical referral, he wants to involve his retired Publishing rights manager friend to discuss plan of care. After discussing this with him, Dr. Brett Fairy was made aware. I called him back to make him aware of his option to collect CT results from [facility] for his friend's review and then to call us back with his plan. LVM at 10:30 to have patient call back so that we can give him that option.

## 2017-07-26 NOTE — Telephone Encounter (Signed)
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   CD

## 2017-07-29 NOTE — Progress Notes (Signed)
Referral has been sent to Neurosurgery  

## 2017-08-06 DIAGNOSIS — M5412 Radiculopathy, cervical region: Secondary | ICD-10-CM | POA: Diagnosis not present

## 2017-08-06 DIAGNOSIS — M6258 Muscle wasting and atrophy, not elsewhere classified, other site: Secondary | ICD-10-CM | POA: Diagnosis not present

## 2017-08-12 ENCOUNTER — Other Ambulatory Visit (HOSPITAL_COMMUNITY): Payer: Self-pay | Admitting: Neurosurgery

## 2017-08-12 DIAGNOSIS — M5412 Radiculopathy, cervical region: Secondary | ICD-10-CM

## 2017-09-02 ENCOUNTER — Encounter (HOSPITAL_COMMUNITY): Payer: Self-pay | Admitting: *Deleted

## 2017-09-02 NOTE — Progress Notes (Signed)
Spoke with pt for pre-op call. Pt denies cardiac history, chest pain or sob. States he is not diabetic.

## 2017-09-02 NOTE — Anesthesia Preprocedure Evaluation (Addendum)
Anesthesia Evaluation  Patient identified by MRN, date of birth, ID band Patient awake    Reviewed: Allergy & Precautions, H&P , NPO status , Patient's Chart, lab work & pertinent test results  Airway Mallampati: II  TM Distance: >3 FB Neck ROM: full    Dental no notable dental hx. (+) Teeth Intact, Dental Advisory Given   Pulmonary former smoker,    Pulmonary exam normal breath sounds clear to auscultation       Cardiovascular Exercise Tolerance: Good negative cardio ROS Normal cardiovascular exam Rhythm:regular Rate:Normal     Neuro/Psych Anxiety Tingling/numbness left arm    GI/Hepatic negative GI ROS, (+)     substance abuse  alcohol use,   Endo/Other  negative endocrine ROS  Renal/GU negative Renal ROS  negative genitourinary   Musculoskeletal  (+) Arthritis ,   Abdominal   Peds  Hematology negative hematology ROS (+)   Anesthesia Other Findings   Reproductive/Obstetrics negative OB ROS                            Anesthesia Physical  Anesthesia Plan  ASA: II  Anesthesia Plan: General   Post-op Pain Management:    Induction: Intravenous  PONV Risk Score and Plan: 2 and Dexamethasone, Ondansetron and Treatment may vary due to age or medical condition  Airway Management Planned: LMA  Additional Equipment: None  Intra-op Plan:   Post-operative Plan: Extubation in OR  Informed Consent: I have reviewed the patients History and Physical, chart, labs and discussed the procedure including the risks, benefits and alternatives for the proposed anesthesia with the patient or authorized representative who has indicated his/her understanding and acceptance.   Dental Advisory Given  Plan Discussed with: CRNA  Anesthesia Plan Comments:         Anesthesia Quick Evaluation

## 2017-09-03 ENCOUNTER — Encounter (HOSPITAL_COMMUNITY): Admission: RE | Disposition: A | Discharge status: Home / Self Care | Payer: Self-pay | Source: Ambulatory Visit

## 2017-09-03 ENCOUNTER — Ambulatory Visit (HOSPITAL_COMMUNITY)
Admission: RE | Admit: 2017-09-03 | Discharge: 2017-09-03 | Disposition: A | Discharge status: Home / Self Care | Payer: 59 | Source: Ambulatory Visit | Attending: Neurosurgery | Admitting: Neurosurgery

## 2017-09-03 ENCOUNTER — Ambulatory Visit (HOSPITAL_COMMUNITY): Payer: 59 | Admitting: Anesthesiology

## 2017-09-03 ENCOUNTER — Encounter (HOSPITAL_COMMUNITY): Payer: Self-pay | Admitting: *Deleted

## 2017-09-03 ENCOUNTER — Ambulatory Visit (HOSPITAL_COMMUNITY)
Admission: RE | Admit: 2017-09-03 | Discharge: 2017-09-03 | Disposition: A | Discharge status: Home / Self Care | Payer: 59 | Source: Ambulatory Visit | Attending: Radiology | Admitting: Radiology

## 2017-09-03 DIAGNOSIS — F419 Anxiety disorder, unspecified: Secondary | ICD-10-CM | POA: Diagnosis not present

## 2017-09-03 DIAGNOSIS — R200 Anesthesia of skin: Secondary | ICD-10-CM | POA: Insufficient documentation

## 2017-09-03 DIAGNOSIS — M5412 Radiculopathy, cervical region: Secondary | ICD-10-CM | POA: Diagnosis not present

## 2017-09-03 DIAGNOSIS — M50121 Cervical disc disorder at C4-C5 level with radiculopathy: Secondary | ICD-10-CM | POA: Insufficient documentation

## 2017-09-03 DIAGNOSIS — Z87891 Personal history of nicotine dependence: Secondary | ICD-10-CM | POA: Insufficient documentation

## 2017-09-03 DIAGNOSIS — M179 Osteoarthritis of knee, unspecified: Secondary | ICD-10-CM | POA: Diagnosis not present

## 2017-09-03 DIAGNOSIS — M4802 Spinal stenosis, cervical region: Secondary | ICD-10-CM | POA: Diagnosis not present

## 2017-09-03 HISTORY — PX: RADIOLOGY WITH ANESTHESIA: SHX6223

## 2017-09-03 HISTORY — DX: Pneumonia, unspecified organism: J18.9

## 2017-09-03 LAB — COMPREHENSIVE METABOLIC PANEL
ALT: 18 U/L (ref 17–63)
AST: 35 U/L (ref 15–41)
Albumin: 4 g/dL (ref 3.5–5.0)
Alkaline Phosphatase: 67 U/L (ref 38–126)
Anion gap: 9 (ref 5–15)
BUN: 7 mg/dL (ref 6–20)
CO2: 25 mmol/L (ref 22–32)
Calcium: 9.1 mg/dL (ref 8.9–10.3)
Chloride: 104 mmol/L (ref 101–111)
Creatinine, Ser: 0.69 mg/dL (ref 0.61–1.24)
GFR calc Af Amer: >60 mL/min (ref >60)
GFR calc non Af Amer: >60 mL/min (ref >60)
Glucose, Bld: 82 mg/dL (ref 65–99)
Potassium: 3.9 mmol/L (ref 3.5–5.1)
Sodium: 138 mmol/L (ref 135–145)
Total Bilirubin: 0.9 mg/dL (ref 0.3–1.2)
Total Protein: 6.7 g/dL (ref 6.5–8.1)

## 2017-09-03 SURGERY — MRI WITH ANESTHESIA
Anesthesia: General

## 2017-09-03 MED ORDER — LACTATED RINGERS IV SOLN
INTRAVENOUS | Status: DC
  Administered 2017-09-03: 07:00:00 via INTRAVENOUS

## 2017-09-03 NOTE — Progress Notes (Signed)
Dr Fransisco Beau informed of Bp. No new orders at present time.

## 2017-09-03 NOTE — Transfer of Care (Signed)
Immediate Anesthesia Transfer of Care Note  Patient: Danny Kerr  Procedure(s) Performed: MIR CERVICAL [name] CONSTRAST (N/A )  Patient Location: PACU  Anesthesia Type:General  Level of Consciousness: awake, alert , oriented and patient cooperative  Airway & Oxygen Therapy: Patient Spontanous Breathing and Patient connected to nasal cannula oxygen  Post-op Assessment: Report given to RN and Post -op Vital signs reviewed and stable  Post vital signs: Reviewed and stable  Last Vitals:  Vitals:   09/03/17 0635 09/03/17 0934  BP: (!) 161/104 (!) 143/93  Pulse: 67 88  Resp: 18 18  Temp: 36.7 C 36.7 C  SpO2: 98% 97%    Last Pain: There were no vitals filed for this visit.    Patients Stated Pain Goal: 10 (19/01/22 2411)  Complications: No apparent anesthesia complications

## 2017-09-03 NOTE — Anesthesia Procedure Notes (Signed)
Procedure Name: Intubation Date/Time: 09/03/2017 8:32 AM Performed by: Shirlyn Goltz, CRNA Pre-anesthesia Checklist: Patient identified, Emergency Drugs available, Suction available and Patient being monitored Patient Re-evaluated:Patient Re-evaluated prior to induction Oxygen Delivery Method: Circle system utilized Preoxygenation: Pre-oxygenation with 100% oxygen Induction Type: IV induction Laryngoscope Size: Mac and 4 Grade View: Grade II Tube type: Oral Tube size: 7.5 mm Number of attempts: 1 Airway Equipment and Method: Stylet Placement Confirmation: ETT inserted through vocal cords under direct vision,  positive ETCO2 and breath sounds checked- equal and bilateral Secured at: 23 cm Tube secured with: Tape Dental Injury: Teeth and Oropharynx as per pre-operative assessment

## 2017-09-03 NOTE — Anesthesia Postprocedure Evaluation (Signed)
Anesthesia Post Note  Patient: Danny Kerr  Procedure(s) Performed: MIR CERVICAL [name] CONSTRAST (N/A )     Patient location during evaluation: PACU Anesthesia Type: General Level of consciousness: awake and alert Pain management: pain level controlled Vital Signs Assessment: post-procedure vital signs reviewed and stable Respiratory status: spontaneous breathing, nonlabored ventilation and respiratory function stable Cardiovascular status: blood pressure returned to baseline and stable Postop Assessment: no apparent nausea or vomiting Anesthetic complications: no    Last Vitals:  Vitals:   09/03/17 0635 09/03/17 0934  BP: (!) 161/104 (!) 143/93  Pulse: 67 88  Resp: 18 18  Temp: 36.7 C 36.7 C  SpO2: 98% 97%    Last Pain: There were no vitals filed for this visit.               Audry Pili

## 2017-09-04 ENCOUNTER — Encounter (HOSPITAL_COMMUNITY): Payer: Self-pay | Admitting: Radiology

## 2017-09-06 MED FILL — Lidocaine HCl IV Inj 20 MG/ML: INTRAVENOUS | Qty: 5 | Status: AC

## 2017-09-06 MED FILL — Midazolam HCl Inj 2 MG/2ML (Base Equivalent): INTRAMUSCULAR | Qty: 2 | Status: AC

## 2017-09-06 MED FILL — Phenylephrine HCl IV Soln Pref Syr 0.4 MG/10ML (40 MCG/ML): INTRAVENOUS | Qty: 10 | Status: AC

## 2017-09-06 MED FILL — Fentanyl Citrate Preservative Free (PF) Inj 100 MCG/2ML: INTRAMUSCULAR | Qty: 2 | Status: AC

## 2017-09-06 MED FILL — Ondansetron HCl Inj 4 MG/2ML (2 MG/ML): INTRAMUSCULAR | Qty: 2 | Status: AC

## 2017-09-06 MED FILL — Lactated Ringer's Solution: INTRAVENOUS | Qty: 1000 | Status: AC

## 2017-09-06 MED FILL — Succinylcholine Chloride Sol Pref Syr 200 MG/10ML (20 MG/ML): INTRAVENOUS | Qty: 10 | Status: AC

## 2017-09-06 MED FILL — Propofol IV Emul 200 MG/20ML (10 MG/ML): INTRAVENOUS | Qty: 20 | Status: AC

## 2017-09-06 MED FILL — Dexamethasone Sodium Phosphate Inj 10 MG/ML: INTRAMUSCULAR | Qty: 1 | Status: AC

## 2017-09-18 DIAGNOSIS — Z23 Encounter for immunization: Secondary | ICD-10-CM | POA: Diagnosis not present

## 2017-09-24 DIAGNOSIS — R030 Elevated blood-pressure reading, without diagnosis of hypertension: Secondary | ICD-10-CM | POA: Diagnosis not present

## 2017-09-24 DIAGNOSIS — M6258 Muscle wasting and atrophy, not elsewhere classified, other site: Secondary | ICD-10-CM | POA: Diagnosis not present

## 2017-12-10 DIAGNOSIS — R82998 Other abnormal findings in urine: Secondary | ICD-10-CM | POA: Diagnosis not present

## 2017-12-10 DIAGNOSIS — Z0000 Encounter for general adult medical examination without abnormal findings: Secondary | ICD-10-CM | POA: Diagnosis not present

## 2017-12-17 DIAGNOSIS — Z1389 Encounter for screening for other disorder: Secondary | ICD-10-CM | POA: Diagnosis not present

## 2017-12-17 DIAGNOSIS — E559 Vitamin D deficiency, unspecified: Secondary | ICD-10-CM | POA: Diagnosis not present

## 2017-12-17 DIAGNOSIS — Z0000 Encounter for general adult medical examination without abnormal findings: Secondary | ICD-10-CM | POA: Diagnosis not present

## 2017-12-17 DIAGNOSIS — R030 Elevated blood-pressure reading, without diagnosis of hypertension: Secondary | ICD-10-CM | POA: Diagnosis not present

## 2017-12-17 DIAGNOSIS — E7849 Other hyperlipidemia: Secondary | ICD-10-CM | POA: Diagnosis not present

## 2018-02-05 DIAGNOSIS — L821 Other seborrheic keratosis: Secondary | ICD-10-CM | POA: Diagnosis not present

## 2018-02-05 DIAGNOSIS — D225 Melanocytic nevi of trunk: Secondary | ICD-10-CM | POA: Diagnosis not present

## 2018-02-05 DIAGNOSIS — L718 Other rosacea: Secondary | ICD-10-CM | POA: Diagnosis not present

## 2018-02-06 DIAGNOSIS — M1711 Unilateral primary osteoarthritis, right knee: Secondary | ICD-10-CM | POA: Diagnosis not present

## 2018-07-23 DIAGNOSIS — Z23 Encounter for immunization: Secondary | ICD-10-CM | POA: Diagnosis not present

## 2018-12-16 DIAGNOSIS — Z0000 Encounter for general adult medical examination without abnormal findings: Secondary | ICD-10-CM | POA: Diagnosis not present

## 2018-12-16 DIAGNOSIS — R82998 Other abnormal findings in urine: Secondary | ICD-10-CM | POA: Diagnosis not present

## 2018-12-23 DIAGNOSIS — Z0000 Encounter for general adult medical examination without abnormal findings: Secondary | ICD-10-CM | POA: Diagnosis not present

## 2018-12-23 DIAGNOSIS — E7849 Other hyperlipidemia: Secondary | ICD-10-CM | POA: Diagnosis not present

## 2018-12-23 DIAGNOSIS — E559 Vitamin D deficiency, unspecified: Secondary | ICD-10-CM | POA: Diagnosis not present

## 2018-12-23 DIAGNOSIS — R740 Nonspecific elevation of levels of transaminase and lactic acid dehydrogenase [LDH]: Secondary | ICD-10-CM | POA: Diagnosis not present

## 2019-02-09 DIAGNOSIS — L821 Other seborrheic keratosis: Secondary | ICD-10-CM | POA: Diagnosis not present

## 2019-02-09 DIAGNOSIS — L718 Other rosacea: Secondary | ICD-10-CM | POA: Diagnosis not present

## 2019-03-12 DIAGNOSIS — H524 Presbyopia: Secondary | ICD-10-CM | POA: Diagnosis not present

## 2019-03-12 DIAGNOSIS — H5213 Myopia, bilateral: Secondary | ICD-10-CM | POA: Diagnosis not present

## 2019-03-12 DIAGNOSIS — H25813 Combined forms of age-related cataract, bilateral: Secondary | ICD-10-CM | POA: Diagnosis not present

## 2019-08-20 ENCOUNTER — Emergency Department (HOSPITAL_COMMUNITY)
Admission: EM | Admit: 2019-08-20 | Discharge: 2019-08-20 | Disposition: A | Discharge status: Home / Self Care | Payer: BC Managed Care – PPO | Attending: Emergency Medicine | Admitting: Emergency Medicine

## 2019-08-20 ENCOUNTER — Encounter (HOSPITAL_COMMUNITY): Payer: Self-pay

## 2019-08-20 ENCOUNTER — Emergency Department (HOSPITAL_COMMUNITY): Payer: BC Managed Care – PPO

## 2019-08-20 ENCOUNTER — Other Ambulatory Visit: Payer: Self-pay

## 2019-08-20 DIAGNOSIS — Z79899 Other long term (current) drug therapy: Secondary | ICD-10-CM | POA: Insufficient documentation

## 2019-08-20 DIAGNOSIS — R519 Headache, unspecified: Secondary | ICD-10-CM | POA: Diagnosis not present

## 2019-08-20 DIAGNOSIS — Z87891 Personal history of nicotine dependence: Secondary | ICD-10-CM | POA: Insufficient documentation

## 2019-08-20 DIAGNOSIS — F10230 Alcohol dependence with withdrawal, uncomplicated: Secondary | ICD-10-CM | POA: Diagnosis not present

## 2019-08-20 DIAGNOSIS — R945 Abnormal results of liver function studies: Secondary | ICD-10-CM | POA: Diagnosis not present

## 2019-08-20 DIAGNOSIS — Z96651 Presence of right artificial knee joint: Secondary | ICD-10-CM | POA: Diagnosis not present

## 2019-08-20 DIAGNOSIS — Y906 Blood alcohol level of 120-199 mg/100 ml: Secondary | ICD-10-CM | POA: Insufficient documentation

## 2019-08-20 DIAGNOSIS — R7989 Other specified abnormal findings of blood chemistry: Secondary | ICD-10-CM

## 2019-08-20 DIAGNOSIS — F10930 Alcohol use, unspecified with withdrawal, uncomplicated: Secondary | ICD-10-CM

## 2019-08-20 DIAGNOSIS — R739 Hyperglycemia, unspecified: Secondary | ICD-10-CM | POA: Diagnosis present

## 2019-08-20 LAB — COMPREHENSIVE METABOLIC PANEL
ALT: 46 U/L — ABNORMAL HIGH (ref 0–44)
AST: 103 U/L — ABNORMAL HIGH (ref 15–41)
Albumin: 4.4 g/dL (ref 3.5–5.0)
Alkaline Phosphatase: 89 U/L (ref 38–126)
Anion gap: 15 (ref 5–15)
BUN: 11 mg/dL (ref 8–23)
CO2: 26 mmol/L (ref 22–32)
Calcium: 9 mg/dL (ref 8.9–10.3)
Chloride: 96 mmol/L — ABNORMAL LOW (ref 98–111)
Creatinine, Ser: 0.7 mg/dL (ref 0.61–1.24)
GFR calc Af Amer: >60 mL/min (ref >60)
GFR calc non Af Amer: >60 mL/min (ref >60)
Glucose, Bld: 165 mg/dL — ABNORMAL HIGH (ref 70–99)
Potassium: 4.1 mmol/L (ref 3.5–5.1)
Sodium: 137 mmol/L (ref 135–145)
Total Bilirubin: 1.7 mg/dL — ABNORMAL HIGH (ref 0.3–1.2)
Total Protein: 7.6 g/dL (ref 6.5–8.1)

## 2019-08-20 LAB — CBC WITH DIFFERENTIAL/PLATELET
Abs Immature Granulocytes: 0.01 10*3/uL (ref 0.00–0.07)
Basophils Absolute: 0.1 10*3/uL (ref 0.0–0.1)
Basophils Relative: 2 %
Eosinophils Absolute: 0 10*3/uL (ref 0.0–0.5)
Eosinophils Relative: 0 %
HCT: 47.6 % (ref 39.0–52.0)
Hemoglobin: 16.1 g/dL (ref 13.0–17.0)
Immature Granulocytes: 0 %
Lymphocytes Relative: 10 %
Lymphs Abs: 0.3 10*3/uL — ABNORMAL LOW (ref 0.7–4.0)
MCH: 33.8 pg (ref 26.0–34.0)
MCHC: 33.8 g/dL (ref 30.0–36.0)
MCV: 99.8 fL (ref 80.0–100.0)
Monocytes Absolute: 0.2 10*3/uL (ref 0.1–1.0)
Monocytes Relative: 8 %
Neutro Abs: 2.4 10*3/uL (ref 1.7–7.7)
Neutrophils Relative %: 80 %
Platelets: 124 10*3/uL — ABNORMAL LOW (ref 150–400)
RBC: 4.77 MIL/uL (ref 4.22–5.81)
RDW: 13.1 % (ref 11.5–15.5)
WBC: 3 10*3/uL — ABNORMAL LOW (ref 4.0–10.5)
nRBC: 0 % (ref 0.0–0.2)

## 2019-08-20 LAB — URINALYSIS, ROUTINE W REFLEX MICROSCOPIC
Bilirubin Urine: NEGATIVE
Glucose, UA: 50 mg/dL — AB
Hgb urine dipstick: NEGATIVE
Ketones, ur: 80 mg/dL — AB
Leukocytes,Ua: NEGATIVE
Nitrite: NEGATIVE
Protein, ur: NEGATIVE mg/dL
Specific Gravity, Urine: 1.024 (ref 1.005–1.030)
pH: 5 (ref 5.0–8.0)

## 2019-08-20 LAB — CBG MONITORING, ED
Glucose-Capillary: 167 mg/dL — ABNORMAL HIGH (ref 70–99)
Glucose-Capillary: 86 mg/dL (ref 70–99)

## 2019-08-20 LAB — TROPONIN I (HIGH SENSITIVITY)
Troponin I (High Sensitivity): 7 ng/L (ref <18)
Troponin I (High Sensitivity): 7 ng/L (ref <18)

## 2019-08-20 LAB — ETHANOL: Alcohol, Ethyl (B): 183 mg/dL — ABNORMAL HIGH (ref <10)

## 2019-08-20 MED ORDER — ONDANSETRON 4 MG PO TBDP: 4.0000 mg | ORAL_TABLET | Freq: Three times a day (TID) | ORAL | 0 refills | Status: DC | PRN

## 2019-08-20 MED ORDER — VITAMIN B-1 100 MG PO TABS
100.0000 mg | ORAL_TABLET | Freq: Every day | ORAL | Status: DC
  Administered 2019-08-20: 15:00:00 100 mg via ORAL
  Filled 2019-08-20: qty 1

## 2019-08-20 MED ORDER — THIAMINE HCL 100 MG/ML IJ SOLN: 100.0000 mg | Freq: Every day | INTRAMUSCULAR | Status: DC

## 2019-08-20 MED ORDER — LORAZEPAM 1 MG PO TABS: 0.0000 mg | ORAL_TABLET | Freq: Two times a day (BID) | ORAL | Status: DC

## 2019-08-20 MED ORDER — SODIUM CHLORIDE 0.9 % IV BOLUS
1000.0000 mL | Freq: Once | INTRAVENOUS | Status: AC
  Administered 2019-08-20: 1000 mL via INTRAVENOUS

## 2019-08-20 MED ORDER — CHLORDIAZEPOXIDE HCL 25 MG PO CAPS: ORAL_CAPSULE | ORAL | 0 refills | Status: DC

## 2019-08-20 MED ORDER — LORAZEPAM 2 MG/ML IJ SOLN: 0.0000 mg | Freq: Four times a day (QID) | INTRAMUSCULAR | Status: DC

## 2019-08-20 MED ORDER — LORAZEPAM 2 MG/ML IJ SOLN: 0.0000 mg | Freq: Two times a day (BID) | INTRAMUSCULAR | Status: DC

## 2019-08-20 MED ORDER — ONDANSETRON 4 MG PO TBDP
4.0000 mg | ORAL_TABLET | Freq: Once | ORAL | Status: AC
  Administered 2019-08-20: 4 mg via ORAL
  Filled 2019-08-20: qty 1

## 2019-08-20 MED ORDER — LORAZEPAM 1 MG PO TABS
0.0000 mg | ORAL_TABLET | Freq: Four times a day (QID) | ORAL | Status: DC
  Administered 2019-08-20: 15:00:00 2 mg via ORAL
  Filled 2019-08-20: qty 2

## 2019-08-20 NOTE — ED Notes (Signed)
Pt tolerating PO at this time.

## 2019-08-20 NOTE — Discharge Instructions (Signed)
Take the Librium as prescribed. Return for new or worsening symptoms.

## 2019-08-20 NOTE — ED Triage Notes (Signed)
Pt arrives from home with [facility] for evaluation of low blood sugar. Pt states that he is not a diabetic, has been very stressed for the last several months- which has led to decreased appetite, and increased alcohol consumption. Upon EMS arrival, pt was diaphoretic and blood sugar was 60.

## 2019-08-20 NOTE — ED Notes (Signed)
Patient transported to CT 

## 2019-08-20 NOTE — ED Provider Notes (Addendum)
[facility] DEPT Provider Note   CSN: [id number] Arrival date & time: 08/20/19  1229     History   Chief Complaint Chief Complaint  Patient presents with  . Hypoglycemia    HPI NAYDEN SPINNEY is a 63 y.o. male with past medical history significant for alcohol abuse, pneumonia, arthritis who presents for evaluation of hyperglycemia.  Patient states since onset of COVID-19 he has had decreased appetite and increased anxiety.  States he is also started drinking alcohol daily in excess.  Patient states she drinks approximately 4-5 liquor beverages daily.  Denies prior history of DTs or withdrawal seizures.  Patient states yesterday he did have a mechanical fall when he hit the left side of his head.  He denies LOC or anticoagulation.  Patient states when he woke up this morning he was diaphoretic and tremulous.  Denies any preceding headache, chest pain, shortness of breath, abdominal pain, diarrhea, dysuria, new lateral weakness.  Patient states he will mainly eat 1 meal daily.  On arrival of EMS patient was diaphoretic, tremulous with CBG at 60.  He denies prior history of diabetes.  Denies additional aggravating or alleviating factors. Denies SI, HI , AVH. Denies any intentional ingestions.  History obtained from patient and past medical records.  No interpreter was used.     HPI  Past Medical History:  Diagnosis Date  . Arthritis   . ETOH abuse   . Pneumonia   . Rosacea   . Seasonal allergies     Patient Active Problem List   Diagnosis Date Noted  . OA (osteoarthritis) of knee 09/06/2014    Past Surgical History:  Procedure Laterality Date  . COLONOSCOPY    . KNEE ARTHROTOMY  1977   rt knee  . RADIOLOGY WITH ANESTHESIA N/A 09/03/2017   Procedure: MIR CERVICAL [place];  Surgeon: Radiologist, Medication, MD;  Location: [facility];  Service: Radiology;  Laterality: N/A;  . TOTAL KNEE ARTHROPLASTY Right 09/06/2014   Procedure: RIGHT  TOTAL KNEE ARTHROPLASTY;  Surgeon: Gearlean Alf, MD;  Location: [facility];  Service: Orthopedics;  Laterality: Right;        Home Medications    Prior to Admission medications   Medication Sig Start Date End Date Taking? Authorizing Provider  Azelaic Acid 15 % cream Apply 1 application topically 2 (two) times daily. 04/26/19  Yes [provider]  doxycycline (ADOXA) 100 MG tablet Take 100 mg daily by mouth.    Yes [provider]  escitalopram (LEXAPRO) 10 MG tablet Take 10 mg by mouth daily. 08/05/19  Yes [provider]  ALPRAZolam Duanne Moron) 0.5 MG tablet Take 1 tablet (0.5 mg total) by mouth at bedtime as needed for anxiety. Patient not taking: Reported on 08/27/2017 07/04/17   Dohmeier, Asencion Partridge, MD  chlordiazePOXIDE (LIBRIUM) 25 MG capsule 50mg  PO TID x 1D, then 25-50mg  PO BID X 1D, then 25-50mg  PO QD X 1D 08/20/19   Imanuel Pruiett A, PA-C  ondansetron (ZOFRAN ODT) 4 MG disintegrating tablet Take 1 tablet (4 mg total) by mouth every 8 (eight) hours as needed for nausea or vomiting. 08/20/19   Madellyn Denio A, PA-C    Family History Family History  Problem Relation Age of Onset  . Lung cancer Mother   . Parkinson's disease Sister     Social History Social History   Tobacco Use  . Smoking status: Former Smoker    Quit date: 08/26/1989    Years since quitting: 30.0  . Smokeless  tobacco: Never Used  Substance Use Topics  . Alcohol use: Yes    Comment: heavy daily alcohol use  . Drug use: No    Allergies   Patient has no known allergies.   Review of Systems Review of Systems  Constitutional: Positive for activity change and appetite change. Negative for chills, diaphoresis, fatigue, fever and unexpected weight change.  HENT: Negative.   Respiratory: Negative.   Cardiovascular: Negative.   Gastrointestinal: Negative.   Genitourinary: Negative.   Musculoskeletal: Negative.   Skin: Negative.   Neurological: Positive for tremors. Negative for  dizziness, seizures, syncope, facial asymmetry, speech difficulty, weakness, light-headedness, numbness and headaches.  All other systems reviewed and are negative.   Physical Exam Updated Vital Signs BP (!) 151/89   Pulse 74   Temp 97.9 F (36.6 C) (Oral)   Resp 16   SpO2 98%   Physical Exam Vitals signs and nursing note reviewed.  Constitutional:      General: He is not in acute distress.    Appearance: He is well-developed. He is not ill-appearing, toxic-appearing or diaphoretic.  HENT:     Head: Normocephalic and atraumatic.     Jaw: There is normal jaw occlusion.     Nose: Nose normal.     Mouth/Throat:     Mouth: Mucous membranes are moist.     Pharynx: Oropharynx is clear.  Eyes:     General: Lids are everted, no foreign bodies appreciated. Vision grossly intact.     Extraocular Movements: Extraocular movements intact.     Conjunctiva/sclera: Conjunctivae normal.     Pupils: Pupils are equal, round, and reactive to light.     Comments: Mild ecchymosis to left lower medial canthus.  EOMs intact.  Neck:     Musculoskeletal: Full passive range of motion without pain, normal range of motion and neck supple.     Vascular: No carotid bruit or JVD.     Trachea: Phonation normal.     Comments: No bruit Cardiovascular:     Rate and Rhythm: Normal rate and regular rhythm.     Pulses: Normal pulses.     Heart sounds: Normal heart sounds.  Pulmonary:     Effort: Pulmonary effort is normal. No respiratory distress.     Breath sounds: Normal breath sounds.  Abdominal:     General: Bowel sounds are normal. There is no distension.     Palpations: Abdomen is soft.     Tenderness: There is no abdominal tenderness. There is no right CVA tenderness, left CVA tenderness, guarding or rebound.     Comments: Negative Murphy sign.  Musculoskeletal: Normal range of motion.     Comments: Moves all 4 extremities without difficulty.  Skin:    General: Skin is warm and dry.     Capillary  Refill: Capillary refill takes less than 2 seconds.     Comments: Brisk capillary refill.  No pallor.  No diaphoresis.  No rashes or lesions.  Neurological:     General: No focal deficit present.     Mental Status: He is alert.     Cranial Nerves: Cranial nerves are intact.     Sensory: Sensation is intact.     Motor: Motor function is intact.     Coordination: Coordination is intact.     Gait: Gait is intact.     Comments: Mild bilateral tremors. Mental Status:  Alert, oriented, thought content appropriate. Speech fluent without evidence of aphasia. Able to follow 2 step commands without  difficulty.  Cranial Nerves:  II:  Peripheral visual fields grossly normal, pupils equal, round, reactive to light III,IV, VI: ptosis not present, extra-ocular motions intact bilaterally  V,VII: smile symmetric, facial light touch sensation equal VIII: hearing grossly normal bilaterally  IX,X: midline uvula rise  XI: bilateral shoulder shrug equal and strong XII: midline tongue extension  Motor:  5/5 in upper and lower extremities bilaterally including strong and equal grip strength and dorsiflexion/plantar flexion Sensory: Pinprick and light touch normal in all extremities.  Deep Tendon Reflexes: 2+ and symmetric  Cerebellar: normal finger-to-nose with bilateral upper extremities Gait: normal gait and balance CV: distal pulses palpable throughout    Psychiatric:        Mood and Affect: Mood is anxious.        Behavior: Behavior normal.        Thought Content: Thought content normal. Thought content is not paranoid or delusional. Thought content does not include homicidal or suicidal ideation. Thought content does not include homicidal or suicidal plan.    Labs (all labs ordered are listed, but only abnormal results are displayed) Labs Reviewed  CBC WITH DIFFERENTIAL/PLATELET - Abnormal; Notable for the following components:      Result Value   WBC 3.0 (*)    Platelets 124 (*)    Lymphs Abs  0.3 (*)    All other components within normal limits  COMPREHENSIVE METABOLIC PANEL - Abnormal; Notable for the following components:   Chloride 96 (*)    Glucose, Bld 165 (*)    AST 103 (*)    ALT 46 (*)    Total Bilirubin 1.7 (*)    All other components within normal limits  URINALYSIS, ROUTINE W REFLEX MICROSCOPIC - Abnormal; Notable for the following components:   Color, Urine AMBER (*)    Glucose, UA 50 (*)    Ketones, ur 80 (*)    All other components within normal limits  ETHANOL - Abnormal; Notable for the following components:   Alcohol, Ethyl (B) 183 (*)    All other components within normal limits  CBG MONITORING, ED - Abnormal; Notable for the following components:   Glucose-Capillary 167 (*)    All other components within normal limits  CBG MONITORING, ED  TROPONIN I (HIGH SENSITIVITY)  TROPONIN I (HIGH SENSITIVITY)    EKG EKG Interpretation  Date/Time:  Thursday August 20 2019 12:56:01 EDT Ventricular Rate:  57 PR Interval:    QRS Duration: 161 QT Interval:  492 QTC Calculation: 480 R Axis:   -39 Text Interpretation: Sinus rhythm Right bundle branch block No STEMI Confirmed by Octaviano Glow [phone]) on 08/20/2019 3:42:26 PM   Radiology Ct Head Wo Contrast  Result Date: 08/20/2019 CLINICAL DATA:  Status post fall last night hit right forehead. EXAM: CT HEAD WITHOUT CONTRAST CT CERVICAL SPINE WITHOUT CONTRAST TECHNIQUE: Multidetector CT imaging of the head and cervical spine was performed following the standard protocol without intravenous contrast. Multiplanar CT image reconstructions of the cervical spine were also generated. COMPARISON:  Head CT June 19, 2004, cervical spine CT July 23, 2017 FINDINGS: CT HEAD FINDINGS Brain: No evidence of acute infarction, hemorrhage, hydrocephalus, extra-axial collection or mass lesion/mass effect. There is chronic diffuse atrophy. Chronic bilateral periventricular white matter small vessel ischemic change is  identified. Vascular: No hyperdense vessel is noted. Skull: Normal. Negative for fracture or focal lesion. Sinuses/Orbits: No acute abnormality. Other: None. CT CERVICAL SPINE FINDINGS Alignment: There is straightening of cervical spine either due to positioning or muscle  spasm. Skull base and vertebrae: No acute fracture. No primary bone lesion or focal pathologic process. Soft tissues and spinal canal: No prevertebral fluid or swelling. No visible canal hematoma. Disc levels: Degenerative joint changes of mid to lower cervical spine with narrowed joint space and osteophyte formation are noted. Upper chest: Negative. Other: None. IMPRESSION: No focal acute intracranial abnormality identified. No acute fracture or dislocation of cervical spine. Electronically Signed   By: Abelardo Diesel M.D.   On: 08/20/2019 15:02   Ct Cervical Spine Wo Contrast  Result Date: 08/20/2019 CLINICAL DATA:  Status post fall last night hit right forehead. EXAM: CT HEAD WITHOUT CONTRAST CT CERVICAL SPINE WITHOUT CONTRAST TECHNIQUE: Multidetector CT imaging of the head and cervical spine was performed following the standard protocol without intravenous contrast. Multiplanar CT image reconstructions of the cervical spine were also generated. COMPARISON:  Head CT June 19, 2004, cervical spine CT July 23, 2017 FINDINGS: CT HEAD FINDINGS Brain: No evidence of acute infarction, hemorrhage, hydrocephalus, extra-axial collection or mass lesion/mass effect. There is chronic diffuse atrophy. Chronic bilateral periventricular white matter small vessel ischemic change is identified. Vascular: No hyperdense vessel is noted. Skull: Normal. Negative for fracture or focal lesion. Sinuses/Orbits: No acute abnormality. Other: None. CT CERVICAL SPINE FINDINGS Alignment: There is straightening of cervical spine either due to positioning or muscle spasm. Skull base and vertebrae: No acute fracture. No primary bone lesion or focal pathologic process.  Soft tissues and spinal canal: No prevertebral fluid or swelling. No visible canal hematoma. Disc levels: Degenerative joint changes of mid to lower cervical spine with narrowed joint space and osteophyte formation are noted. Upper chest: Negative. Other: None. IMPRESSION: No focal acute intracranial abnormality identified. No acute fracture or dislocation of cervical spine. Electronically Signed   By: Abelardo Diesel M.D.   On: 08/20/2019 15:02   Dg Chest Portable 1 View  Result Date: 08/20/2019 CLINICAL DATA:  Hypoglycemia. Tremors. EXAM: PORTABLE CHEST 1 VIEW COMPARISON:  01/25/2004 FINDINGS: Normal sized heart. Clear lungs. Thoracic spine degenerative changes. IMPRESSION: No acute abnormality. Electronically Signed   By: Claudie Revering M.D.   On: 08/20/2019 14:54    Procedures Procedures (including critical care time)  Medications Ordered in ED Medications  LORazepam (ATIVAN) injection 0-4 mg ( Intravenous See Alternative 08/20/19 1454)    Or  LORazepam (ATIVAN) tablet 0-4 mg (2 mg Oral Given 08/20/19 1454)  LORazepam (ATIVAN) injection 0-4 mg (has no administration in time range)    Or  LORazepam (ATIVAN) tablet 0-4 mg (has no administration in time range)  thiamine (VITAMIN B-1) tablet 100 mg (100 mg Oral Given 08/20/19 1454)    Or  thiamine (B-1) injection 100 mg ( Intravenous See Alternative 08/20/19 1454)  sodium chloride 0.9 % bolus 1,000 mL (1,000 mLs Intravenous Bolus 08/20/19 1454)  ondansetron (ZOFRAN-ODT) disintegrating tablet 4 mg (4 mg Oral Given 08/20/19 1730)   Initial Impression / Assessment and Plan / ED Course  I have reviewed the triage vital signs and the nursing notes.  Pertinent labs & imaging results that were available during my care of the patient were reviewed by me and considered in my medical decision making (see chart for details).  63 year old male appears otherwise well presents for evaluation of tremors.  He is afebrile, nonseptic, non-ill-appearing.   Patient with hx alcohol abuse.  He denies history of DTs or withdrawal seizures.  Last drink 18 hours PTA.  Denies preceding chest pain, shortness of breath.  He has been tolerating p.o.  intake without difficulty.  Patient does have history of anxiety.  Denies SI, HI, AVH.  Focal neurologic exam without deficits.  Heart and lungs clear.  Abdomen soft, nontender without rebound or guarding.  He did have recent fall yesterday.  CBC without leukocytosis, low platelets 124, consistent with prior labs and alcohol use. Ethanol level [id number] Metabolic panel with elevated LFTs, consistent with previous levels per family in room at recent PCP office.  BG stable at 165.  Negative Murphy sign, no abdominal pain.  I have low suspicion for cholecystitis, choledocholithiasis or infectious process as cause of patient's elevated LFTs. Urinalysis negative for infection Delta troponin  EKG RBBB. No STEMI Plain film chest without cardiomegaly, pulmonary edema, pneumothorax, infiltrates. CT head, neck negative for acute pathology.  Patient with CIWA of 13.  He was given Ativan.  He does not want inpatient management.  He has no plans for alcohol sensation.  States he will follow-up outpatient if he chooses to do so.  Patient requesting DC home.  Discussed with patient delta troponin he is agreeable for this.  He does not want any additional Ativan at this time.  Reevaluation patient continues to have mild tremors however denies audio/visual hallucinations, chest pain, shortness of breath.  He is tolerating p.o. intake, sandwich and ginger ale without difficulty.  No evidence of emesis while in the emergency department.  Patient has stable CBG.  Symptoms likely related to alcohol use.  His repeat CIWA is improved to 4 and patient does not want any additional Ativan at this time.  He also does not want inpatient management for alcoholic withdrawal, hospitalization for his symptoms in the ED for his symptoms. Discussed risk vs  benefit of inpatient vs outpatient trend of his alcohol withdrawal.  Patient and wife in room voiced understanding patient does not want any further intervention at this time.  Discussed possibility of DTs or alcohol withdrawal seizures and patient voiced understanding of this.  His family would like him to seek treatment however patient does not want this at this time.  No evidence of seizure like activity. Will give outpatient resources as well as Librium tapering if patient decides to sustain from alcohol.  Vital signs stable, no tachycardia, tachypnea or hypoxia.  Discussed plan with patient and wife in room.  They voiced understanding agreeable follow-up.  The patient has been appropriately medically screened and/or stabilized in the ED. I have low suspicion for any other emergent medical condition which would require further screening, evaluation or treatment in the ED or require inpatient management.  Patient is hemodynamically stable and in no acute distress.  Patient able to ambulate in department prior to ED.  Evaluation does not show acute pathology that would require ongoing or additional emergent interventions while in the emergency department or further inpatient treatment.  I have discussed the diagnosis with the patient and answered all questions.  Pain is been managed while in the emergency department and patient has no further complaints prior to discharge.  Patient is comfortable with plan discussed in room and is stable for discharge at this time.  I have discussed strict return precautions for returning to the emergency department.  Patient was encouraged to follow-up with PCP/specialist refer to at discharge.        Final Clinical Impressions(s) / ED Diagnoses   Final diagnoses:  Alcohol withdrawal syndrome without complication (HCC)  Elevated LFTs    ED Discharge Orders         Ordered  chlordiazePOXIDE (LIBRIUM) 25 MG capsule     08/20/19 1714    ondansetron (ZOFRAN  ODT) 4 MG disintegrating tablet  Every 8 hours PRN     08/20/19 1714           Stepan Verrette A, PA-C 08/20/19 1748    Lasaundra Riche A, PA-C 08/20/19 1801    Sherlock Nancarrow A, PA-C 08/20/19 1811    Wyvonnia Dusky, MD 08/21/19 1356

## 2019-09-22 ENCOUNTER — Ambulatory Visit (INDEPENDENT_AMBULATORY_CARE_PROVIDER_SITE_OTHER): Payer: BC Managed Care – PPO | Admitting: Licensed Clinical Social Worker

## 2019-09-22 ENCOUNTER — Other Ambulatory Visit: Payer: Self-pay

## 2019-09-22 DIAGNOSIS — F1010 Alcohol abuse, uncomplicated: Secondary | ICD-10-CM

## 2019-09-22 DIAGNOSIS — F1994 Other psychoactive substance use, unspecified with psychoactive substance-induced mood disorder: Secondary | ICD-10-CM

## 2019-09-22 NOTE — Progress Notes (Signed)
Comprehensive Clinical Assessment (CCA) Note  09/22/2019 AMORE SUKHRAM [id number]  Visit Diagnosis:   No diagnosis found.    CCA Part One  Part One has been completed on paper by the patient.  (See scanned document in Chart Review)  CCA Part Two A  Intake/Chief Complaint:  CCA Intake With Chief Complaint CCA Part Two Date: 09/22/19 Chief Complaint/Presenting Problem: Stepdown from residential at [facility] to [facility] program Patients Currently Reported Symptoms/Problems: denies cravings; wanting to continue treatment and accountability Collateral Involvement: [facility] paperwork requested Individual's Strengths: voiced motivation, meeting with [place] sponsor this day Individual's Preferences: follow recommendations Individual's Abilities: strong worth ethic Type of Services Patient Feels Are Needed: 'whatever you recommend' Initial Clinical Notes/Concerns: See Below Client is a 63 year old married Caucasian male referred by [facility] [place] as a step down from residential treatment.  Client currently lives at home with his wife of 29 years. Client and wife will be downsizing and moving houses this week which client identified as a stressor. Additional stressor in the marriage is due to his drinking increasing over the past 10-15 years, reporting his wife is considering temporarily living separately for several months. Client reports he is understanding of her not wanting to 'babysit' him as he feels he is 'on trial because of the drinking' with his family. Client has 2 children who live out of the city. Client reports overall positive relationship with children. Client states wife and children were who presented [facility] as needed treatment which he agreed to. Client was born in [place] with married parents, 6 older brother and 1 older sister. One sister passed away from health complications. Client states his father and one sister were likely  alcoholics when younger but had not drank in 20+ years. Client's father and mother are passed away. Client states overall a positive childhood, 'grew up with the great WW2 generation' stating he did not know much about family alcohol use 'it was probably there but nobody talked about it as a problem back then.' Client is currently unemployed due to Covid related shut downs. 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 Client has been unemployed since around July and is trying to find work, but is now aware he must achieve and maintain sobriety to be successful at work and home life. Client states recognizing drinking was a problem in July/August when he still felt and looked back after a physical showed no major physical health concerns. Client states he was losing weight, increased rosacea, trouble eating, poor balance, shaking, and wife stated 'you look like you're in chemo.' Client reports 'I thought if I get a checkup regularly then I could get away with it, that's when I knew there was a problem.' Client saw a counselor/psychologist in July/august wo recommended AA meetings. Client states his drinking had been increasing over 5 years but due to loss of job and free time at home August 2020 he began to see his drinking as problematic.  Substance Abuse: Tobacco stopped 30 years previous  Alcohol: First use: age 6 Last use: 09/21/19 (did have to leave the home to buy; states went to buy non-alcoholic beer but could not find that; drank 2 beers 'it's like when you are told you can't have something for a while you just have to try it to see if you still  like it' Client reports primary concern is vodka and if that is not in the home there will not be a problem) Last use prior to yesterday: 08/20/19 3 days before entering treatment at [facility] Average  Use: 3-4 beers and half a fifth of vodka daily; Client reports withdrawal symptoms within 12 hours of stopping drinking previously including shaking, cold sweats, headache, nausea.   Client denies any additional substance use separate of marijuana at age 77 socially and cocaine 'in the 80s everyone tried it at least once'   Mental Health Symptoms Depression:  Depression: N/A  Mania:  Mania: N/A  Anxiety:   Anxiety: N/A  Psychosis:  Psychosis: N/A  Trauma:  Trauma: N/A  Obsessions:  Obsessions: N/A  Compulsions:  Compulsions: N/A  Inattention:  Inattention: N/A  Hyperactivity/Impulsivity:  Hyperactivity/Impulsivity: N/A  Oppositional/Defiant Behaviors:  Oppositional/Defiant Behaviors: N/A  Borderline Personality:     Other Mood/Personality Symptoms:      Mental Status Exam Appearance and self-care  Stature:  Stature: Average  Weight:  Weight: Average weight  Clothing:  Clothing: Neat/clean  Grooming:  Grooming: Well-groomed  Cosmetic use:  Cosmetic Use: None  Posture/gait:  Posture/Gait: Normal  Motor activity:  Motor Activity: Not Remarkable  Sensorium  Attention:  Attention: Normal  Concentration:  Concentration: Normal  Orientation:  Orientation: X5  Recall/memory:  Recall/Memory: Normal  Affect and Mood  Affect:  Affect: Appropriate  Mood:  Mood: Euthymic  Relating  Eye contact:  Eye Contact: Normal  Facial expression:  Facial Expression: Responsive(wearing mask)  Attitude toward examiner:  Attitude Toward Examiner: Cooperative  Thought and Language  Speech flow: Speech Flow: Normal  Thought content:  Thought Content: Appropriate to mood and circumstances  Preoccupation:  Preoccupations: (n/a)  Hallucinations:  Hallucinations: Other (Comment)(none)  Organization:     Transport planner of Knowledge:  Fund of Knowledge: Average  Intelligence:  Intelligence: Average  Abstraction:  Abstraction: Normal  Judgement:  Judgement: Fair, Normal, Common-sensical   Reality Testing:  Reality Testing: Adequate  Insight:  Insight: Good, Fair  Decision Making:  Decision Making: Normal  Social Functioning  Social Maturity:  Social Maturity: Responsible  Social Judgement:  Social Judgement: Normal  Stress  Stressors:  Stressors: Family conflict, Grief/losses, Transitions, Work  Coping Ability:  Coping Ability: Overwhelmed  Skill Deficits:     Supports:      Family and Psychosocial History: Family history Marital status: Married Number of Years Married: 29 What types of issues is patient dealing with in the relationship?: 'feeling like i'm on trial' wife may want 3-6 months on own 'to not have to babysit me' Additional relationship information: possible seperate living wanted by wife Are you sexually active?: No Does patient have children?: Yes How many children?: 2 How is patient's relationship with their children?: positive; kids helped wife encouage treatment; client minimize family concerns about drinking  Childhood History:  Childhood History By whom was/is the patient raised?: Both parents Description of patient's relationship with caregiver when they were a child: positve Patient's description of current relationship with people who raised him/her: deceased How were you disciplined when you got in trouble as a child/adolescent?: appropriate Does patient have siblings?: Yes Number of Siblings: 3 Description of patient's current relationship with siblings: positive; one sibling recently died, some trouble with griving and lack of closure Did patient suffer any verbal/emotional/physical/sexual abuse as a child?: No Did patient suffer from severe childhood neglect?: No Has patient ever been sexually abused/assaulted/raped as an adolescent or adult?:  No Was the patient ever a victim of a crime or a disaster?: No Witnessed domestic violence?: No Has patient been effected by domestic violence as an adult?: No  CCA Part Two B  Employment/Work  Situation: Employment / Work Copywriter, advertising Employment situation: Product manager job has been impacted by current illness: Yes Did You Receive Any Psychiatric Treatment/Services While in Passenger transport manager?: No Are There Guns or Other Weapons in Divide?: No Are These Psychologist, educational?: No  Education: Education Did Teacher, adult education From [employer]?: Yes Did Physicist, medical?: Yes What Type of College Degree Do you Have?: business administration Did Heritage manager?: No Did You Have An Individualized Education Program (IIEP): No Did You Have Any Difficulty At [employer]?: No  Religion: Religion/Spirituality Are You A Religious Person?: Yes  Leisure/Recreation:    Exercise/Diet: Exercise/Diet Do You Exercise?: Yes(spin clas 3-4 days per week before covid; no walking dog; helped with anxiety) Have You Gained or Lost A Significant Amount of Weight in the Past Six Months?: No Do You Follow a Special Diet?: No Do You Have Any Trouble Sleeping?: No  CCA Part Two C  Alcohol/Drug Use: Alcohol / Drug Use Pain Medications: denies Prescriptions: see MAR; records from [facility] requested. Unk medication for cravings, zoloft, doxicyclin. client will bring RX History of alcohol / drug use?: Yes Longest period of sobriety (when/how long): 3 days prior to treatment. last use 08/20/2019 Negative Consequences of Use: Personal relationships(stress within the marriage; wife considering separation temporarily, per client 'i feel ike i can't express problems until sober...feels like I'm on trial to stay sober') Withdrawal Symptoms: Cramps, Nausea / Vomiting, Tingling, Tremors, Fever / Chills, Sweats Substance #1 Name of Substance 1: Alcohol 1 - Age of First Use: 15 1 - Amount (size/oz): 3-4 beers + half a fifth 1 - Frequency: daily 1 - Duration: 30 years 1 - Last Use / Amount: 2 beers; 09/21/19(Prior to last use was before entering [facility] on 08/20/19)      CCA Part  Three  ASAM's:  Six Dimensions of Multidimensional Assessment  Dimension 1:  Acute Intoxication and/or Withdrawal Potential:  Dimension 1:  Comments: 2 hx of w/d symtpoms within 12 hours of stopping drinking; less than 3 days without drinking following completion of residential program; rationalizing drinking of beer rather than liquor which he defines as the problem  Dimension 2:  Biomedical Conditions and Complications:  Dimension 2:  Comments: 2 regular physicals; no current problems reported but concern about drinking voiced by PCP in august; trouble eating and sleeping prior to tx  Dimension 3:  Emotional, Behavioral, or Cognitive Conditions and Complications:  Dimension 3:  Comments: 66family discord, rationalizing behaviors, no identifying emotions role they play in substance use.  Dimension 4:  Readiness to Change:  Dimension 4:  Comments: 2 URICA sent home with client; verbalizes motivation for sobriety during assessment an open to treatment recommendations  Dimension 5:  Relapse, Continued use, or Continued Problem Potential:  Dimension 5:  Comments: 3; drinking within 3 days of completion of residential program; rationalizing different types of alcohol  Dimension 6:  Recovery/Living Environment:  Dimension 6:  Recovery/Living Environment Comments: family discord, moving, upcoming holidays, still alcohol in the home, client did leave the home to get alcohol   Substance use Disorder (SUD) Substance Use Disorder (SUD)  Checklist Symptoms of Substance Use: Continued use despite having a persistent/recurrent physical/psychological problem caused/exacerbated by use, Evidence of tolerance, Evidence of withdrawal (Comment), Continued use despite  persistent or recurrent social, interpersonal problems, caused or exacerbated by use, Large amounts of time spent to obtain, use or recover from the substance(s), Persistent desire or unsuccessful efforts to cut down or control use, Presence of craving or  strong urge to use, Repeated use in physically hazardous situations, Substance(s) often taken in large amounts or over longer times than was intended, Recurrent use that results in a fialure to fulfill major rule obligatinos (work, school, home)  Social Function:  Social Functioning Social Maturity: Responsible Social Judgement: Normal  Stress:  Stress Stressors: Family conflict, Grief/losses, Transitions, Work Coping Ability: Overwhelmed Patient Takes Medications The Way The Doctor Instructed?: Yes Priority Risk: Moderate Risk  Risk Assessment- Self-Harm Potential: Risk Assessment For Self-Harm Potential Thoughts of Self-Harm: No current thoughts  Risk Assessment -Dangerous to Others Potential: Risk Assessment For Dangerous to Others Potential Method: No Plan  DSM5 Diagnoses: Patient Active Problem List   Diagnosis Date Noted  . OA (osteoarthritis) of knee 09/06/2014    Patient Centered Plan: Patient is on the following Treatment Plan(s):  Impulse Control/ substance abuse  Recommendations for Services/Supports/Treatments: Recommendations for Services/Supports/Treatments Recommendations For Services/Supports/Treatments: CD-IOP Intensive Chemical Dependency Program  Treatment Plan Summary:  Client recommended to engaged in [place] program with goals to achieve and maintain sobriety 7/7 days per week. Learn an increase use of healthy coping skills to at least 1 time per day at least 3 days per week. Client will build individual support community AEB attending AA/NA meetings at least 3 times per week.  Referrals to Alternative Service(s): Referred to Alternative Service(s):   Place:   Date:   Time:    Referred to Alternative Service(s):   Place:   Date:   Time:    Referred to Alternative Service(s):   Place:   Date:   Time:    Referred to Alternative Service(s):   Place:   Date:   Time:     Olegario Messier, LCSW, LCAS

## 2019-09-23 ENCOUNTER — Other Ambulatory Visit: Payer: Self-pay

## 2019-09-23 ENCOUNTER — Other Ambulatory Visit (HOSPITAL_COMMUNITY): Payer: BC Managed Care – PPO | Attending: Psychiatry | Admitting: Licensed Clinical Social Worker

## 2019-09-23 VITALS — BP 155/90 | HR 70 | Ht 75.0 in | Wt 222.0 lb

## 2019-09-23 DIAGNOSIS — H524 Presbyopia: Secondary | ICD-10-CM | POA: Insufficient documentation

## 2019-09-23 DIAGNOSIS — Z79899 Other long term (current) drug therapy: Secondary | ICD-10-CM | POA: Insufficient documentation

## 2019-09-23 DIAGNOSIS — F19982 Other psychoactive substance use, unspecified with psychoactive substance-induced sleep disorder: Secondary | ICD-10-CM | POA: Insufficient documentation

## 2019-09-23 DIAGNOSIS — F1021 Alcohol dependence, in remission: Secondary | ICD-10-CM | POA: Insufficient documentation

## 2019-09-23 DIAGNOSIS — H52203 Unspecified astigmatism, bilateral: Secondary | ICD-10-CM | POA: Insufficient documentation

## 2019-09-23 DIAGNOSIS — D72810 Lymphocytopenia: Secondary | ICD-10-CM | POA: Diagnosis not present

## 2019-09-23 DIAGNOSIS — D696 Thrombocytopenia, unspecified: Secondary | ICD-10-CM | POA: Insufficient documentation

## 2019-09-23 DIAGNOSIS — Z811 Family history of alcohol abuse and dependence: Secondary | ICD-10-CM | POA: Insufficient documentation

## 2019-09-23 DIAGNOSIS — H5213 Myopia, bilateral: Secondary | ICD-10-CM | POA: Insufficient documentation

## 2019-09-23 DIAGNOSIS — Z87891 Personal history of nicotine dependence: Secondary | ICD-10-CM | POA: Diagnosis not present

## 2019-09-23 DIAGNOSIS — F411 Generalized anxiety disorder: Secondary | ICD-10-CM | POA: Insufficient documentation

## 2019-09-23 DIAGNOSIS — Z96651 Presence of right artificial knee joint: Secondary | ICD-10-CM | POA: Insufficient documentation

## 2019-09-23 DIAGNOSIS — Z889 Allergy status to unspecified drugs, medicaments and biological substances status: Secondary | ICD-10-CM | POA: Diagnosis not present

## 2019-09-23 DIAGNOSIS — E161 Other hypoglycemia: Secondary | ICD-10-CM | POA: Diagnosis not present

## 2019-09-23 DIAGNOSIS — R7989 Other specified abnormal findings of blood chemistry: Secondary | ICD-10-CM | POA: Insufficient documentation

## 2019-09-23 DIAGNOSIS — F4321 Adjustment disorder with depressed mood: Secondary | ICD-10-CM | POA: Diagnosis not present

## 2019-09-23 DIAGNOSIS — L719 Rosacea, unspecified: Secondary | ICD-10-CM | POA: Diagnosis not present

## 2019-09-23 DIAGNOSIS — H2513 Age-related nuclear cataract, bilateral: Secondary | ICD-10-CM | POA: Insufficient documentation

## 2019-09-23 MED ORDER — BACLOFEN 10 MG PO TABS: 10.0000 mg | ORAL_TABLET | Freq: Three times a day (TID) | ORAL | 1 refills | Status: DC

## 2019-09-23 NOTE — Progress Notes (Signed)
Psychiatric Initial Adult Assessment   Patient Identification: Danny Kerr MRN:  [id number] Date of Evaluation:  09/23/2019 Referral Source: [facility]                                  PCP:Le Exie Parody Dr Velna Hatchet Chief Complaint:   Chief Complaint    Establish Care; Alcohol Problem; Stress; Anxiety; unresolved grief; Family Problem     Visit Diagnosis:    ICD-10-CM   1. Alcohol use disorder, severe, in early remission, in controlled environment ([place])  F10.21   2. Elevated LFTs  R79.89    Probale alcohol hepatitis  3. Anxiety state  F41.1   4. Substance or medication-induced sleep disorder, insomnia type ([place])  F19.982   5. Unresolved grief  F43.21    Sister  6. Family history of alcohol abuse and dependence  Z81.1   7. Rosacea  L71.9   8. History of seasonal allergies  Z88.9   9. Status post total right knee replacement  Z96.651   10. Myopia of both eyes with astigmatism and presbyopia  H52.13    H52.203    H52.4   11. Age-related nuclear cataract of both eyes  H25.13   12. Thrombocytopenia (HCC)  D69.6   13. Lymphocytopenia  D72.810   14. Hypoglycemic reaction  E16.1    Glucose 60 in ED 10/29 per provider note    History of Present Illness: 63 yo WM referred for CD IOP after  Residential treatment at [facility]. Pt assessed by IOP counselor 09/22/2019: "Client currently lives at home with his wife of 63 years. Client and wife will be downsizing and moving houses this week which client identified as a stressor. Additional stressor in the marriage is due to his drinking increasing over the past 10-15 years, reporting his wife is considering temporarily living separately for several months. Client reports he is understanding of her not wanting to 'babysit' him as he feels he is 'on trial because of the drinking' with his family. Client has 2 children who live out of the city. Client reports overall positive relationship with children. Client states wife and  children were who presented [facility] as needed treatment which he agreed to. Client was born in [place] with married parents, 69 older brother and 1 older sister. One sister passed away from health complications. Client states his father and one sister were likely alcoholics when younger but had not drank in 20+ years. Client's father and mother are passed away. Client states overall a positive childhood, 'grew up with the great WW2 generation' stating he did not know much about family alcohol use 'it was probably there but nobody talked about it as a problem back then.' Client states recognizing drinking was a problem in [place] when he still felt and looked back after a physical showed no major physical health concerns. Client states he was losing weight, increased rosacea, trouble eating, poor balance, shaking, and wife stated 'you look like you're in chemo.' Client reports 'I thought if I get a checkup regularly then I could get away with it, that's when I knew there was a problem.' Client saw a counselor/psychologist in July/august wo recommended AA meetings. Client states his drinking had been increasing over 5 years but due to loss of job and free time at home August 2020 he began to see his drinking as problematic."  In speaking with him today, he is ready to  continue with his treatment He is currently taking Naltrexone for cravings without noticeable effect. He is taking Trazodone for sleep and Lexapro for anxiety.He will speak with his wife about Baclofen as MAT for alcohol cravings.He admitted he drank 2 beers 09/21/19 out of curiosity-denies any significant reaction but reiterates his commitment to abstinence during treatment.  .Associated Signs/Symptoms:: AUDIT +  DSM V SUD Criteria 8/11 + Alcohol Severe dependence ASAM Criteria  Depression Symptoms:   Anhedonia, Decreased appetite, Feeling bad about self PHQ 9 score 5 Somewhat difficult  (Hypo) Manic Symptoms:Substance use  related  Impulsivity, Irritable Mood, Labiality of Mood,  Anxiety Symptoms:   Excessive Worry, GAD 7 score 5 Somewhat difficult   Psychotic Symptoms:  NA   PTSD Symptoms: Description of patient's current relationship with siblings: positive; one sibling recently died, some trouble with griving and lack of closure  Past Psychiatric History:  [facility] November 2020 Marital Counseling  Previous Psychotropic Medications: Yes   Substance Abuse History in the last 12 months:  Yes.   Substance Abuse History in the last 12 months: Substance Age of 1st Use Last Use Amount Specific Type  Nicotine Quit 08/26/1989     Alcohol 15 09/21/19 2 beers 3-4 beers/1/2 5th QD b4 RX  Cannabis 16 teens Social   Opiates      Cocaine [age]  Peer use  Methamphetamines      LSD      Ecstasy      Benzodiazepines      Caffeine      Inhalants      Others:                          Consequences of Substance Abuse: Medical Consequences:  Anxiety/depression/ Hx Of Fall Oct 28/Elevated liver enzymes/Thrombocytopenia/Wgt loss Legal Consequences:  None Family Consequences:  Marital strain.Children concerned Blackouts:  ? DT's: No Withdrawal Symptoms:    Headaches Nausea Tremors Anxiety Insomnia  Past Medical History:  Past Medical History:  Diagnosis Date  . Arthritis   . ETOH abuse   . Pneumonia   . Rosacea   . Seasonal allergies     Past Surgical History:  Procedure Laterality Date  . COLONOSCOPY    . KNEE ARTHROTOMY  1977   rt knee  . RADIOLOGY WITH ANESTHESIA N/A 09/03/2017   Procedure: MIR CERVICAL Burt Ek;  Surgeon: Radiologist, Medication, MD;  Location: [facility];  Service: Radiology;  Laterality: N/A;  . TOTAL KNEE ARTHROPLASTY Right 09/06/2014   Procedure: RIGHT TOTAL KNEE ARTHROPLASTY;  Surgeon: Gearlean Alf, MD;  Location: [facility];  Service: Orthopedics;  Laterality: Right;    Family Psychiatric History:  Past Hx of alcohol abuse/ dependency Father  Sister  Family History:  Family History  Problem Relation Age of Onset  . Lung cancer Mother   . Parkinson's disease Sister     Social History:   Social History   Socioeconomic History  . Marital status: Married    Spouse name: Belenda Cruise  . Number of children: 2  . Years of education: [employer]  business administration  . Highest education level:   Occupational History  . Client is currently unemployed due to Covid related shut downs. 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 Client has been unemployed since around July and is trying to find  work, but is now aware he must achieve and maintain sobriety to be successful at work and home life.  Tobacco Use  . Smoking status: Former Smoker    Quit date: 08/26/1989    Years since quitting: 30.1  . Smokeless tobacco: Never Used  Substance and Sexual Activity  . Alcohol use: Yes    Comment: heavy daily alcohol use  . Drug use: No  . Sexual activity: Affected by alcohol  Other Topics Concern  . A sister recently died, some trouble with grieving and lack of closure  Social History Narrative  . Family and Psychosocial History: Family history Marital status: Married Number of Years Married: 29 What types of issues is patient dealing with in the relationship?: 'feeling like i'm on trial' wife may want 3-6 months on own 'to not have to babysit me' Additional relationship information: possible seperate living wanted by wife Are you sexually active?: No Does patient have children?: Yes How many children?: 2 How is patient's relationship with their children?: positive; kids helped wife encouage treatment; client minimize family concerns about drinking    Social Determinants of Health   Financial Resource Strain:   . Difficulty of Paying Living Expenses: No  Food Insecurity:   .  Worried About Charity fundraiser in the Last Year: No  . Ran Out of Food in the Last Year: No  Transportation Needs:   . Lack of Transportation (Medical): No  . Lack of Transportation (Non-Medical): No  Physical Activity:   . Days of Exercise per Week:Yes(spin clas 3-4 days per week before covid   . Minutes of Exercise per Session: Not on file  Stress:   . Stressors:  Stressors: Family conflict, Grief/losses, Transitions, Work  Coping Ability:  Coping Ability: Overwhelmed     Social Connections:   . Frequency of Communication with Friends and Family: See Family&Psychosocial History  . Frequency of Social Gatherings with Friends and Family:   . Attends Religious Service:Religion/Spirituality Are You A Religious Person?: Yes  . 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 Attends Archivist Meetings: Not on file  . Marital Status: See Family and Psychosocial History    Allergies:  No Known Allergies  Metabolic Disorder Labs: results found for: MPG Results for HILMAR, BUCKLER (MRN [id number]) as of 10/03/2019 14:44  Ref. Range 08/20/2019 12:55 08/20/2019 17:24  Glucose-Capillary Latest Ref Range: 70 - 99 mg/dL 167 (H) 86   No results found for: PROLACTIN No results found for: CHOL, TRIG, HDL, CHOLHDL, VLDL, LDLCALC No results found for: TSH   CMP Results for KARIS, CADENA (MRN [id number]) as of 10/03/2019 14:44  Ref. Range 08/20/2019 14:03 08/20/2019 16:43  COMPREHENSIVE METABOLIC PANEL Unknown Rpt (A)   Sodium Latest Ref Range: 135 - 145 mmol/L 137   Potassium Latest Ref Range: 3.5 - 5.1 mmol/L 4.1   Chloride Latest Ref Range: 98 - 111 mmol/L 96 (L)   CO2 Latest Ref Range: 22 - 32 mmol/L 26   Glucose Latest Ref Range: 70 - 99 mg/dL 165 (H)   BUN Latest Ref Range: 8 - 23 mg/dL 11   Creatinine Latest Ref Range: 0.61 - 1.24 mg/dL 0.70   Calcium Latest Ref Range: 8.9 - 10.3 mg/dL 9.0   Anion gap Latest Ref Range: 5 - 15  15   Alkaline Phosphatase  Latest Ref Range: 38 - 126 U/L 89   Albumin Latest Ref Range: 3.5 - 5.0 g/dL 4.4   AST Latest Ref Range:  15 - 41 U/L 103 (H)   ALT Latest Ref Range: 0 - 44 U/L 46 (H)   Total Protein Latest Ref Range: 6.5 - 8.1 g/dL 7.6   Total Bilirubin Latest Ref Range: 0.3 - 1.2 mg/dL 1.7 (H)   GFR, Est Non African American Latest Ref Range: >60 mL/min >60   GFR, Est African American Latest Ref Range: >60 mL/min >60    CBC Results for RAFFAELE, TRONOLONE (MRN [id number]) as of 10/03/2019 14:44  Ref. Range 08/20/2019 14:03 08/20/2019 16:43  WBC Latest Ref Range: 4.0 - 10.5 K/uL 3.0 (L)   RBC Latest Ref Range: 4.22 - 5.81 MIL/uL 4.77   Hemoglobin Latest Ref Range: 13.0 - 17.0 g/dL 16.1   HCT Latest Ref Range: 39.0 - 52.0 % 47.6   MCV Latest Ref Range: 80.0 - 100.0 fL 99.8   MCH Latest Ref Range: 26.0 - 34.0 pg 33.8   MCHC Latest Ref Range: 30.0 - 36.0 g/dL 33.8   RDW Latest Ref Range: 11.5 - 15.5 % 13.1   Platelets Latest Ref Range: 150 - 400 K/uL 124 (L)    Therapeutic Level Labs:NA  Current Medications: Current Outpatient Medications  Medication Sig Dispense Refill  . naltrexone (DEPADE) 50 MG tablet Take 50 mg by mouth daily.    . Azelaic Acid 15 % cream Apply 1 application topically 2 (two) times daily.    . baclofen (LIORESAL) 10 MG tablet Take 1 tablet (10 mg total) by mouth 3 (three) times daily. 90 tablet 1  . doxycycline (VIBRA-TABS) 100 MG tablet Take 100 mg by mouth daily.    Marland Kitchen escitalopram (LEXAPRO) 10 MG tablet Take 10 mg by mouth daily.    . Multiple Vitamins-Minerals (QC MULTI-VITE 50 & OVER) TABS TAKE ONE TABLET EVERY DAY    . ondansetron (ZOFRAN ODT) 4 MG disintegrating tablet Take 1 tablet (4 mg total) by mouth every 8 (eight) hours as needed for nausea or vomiting. 20 tablet 0  . thiamine 100 MG tablet TAKE ONE TABLET EVERY DAY    . traZODone (DESYREL) 50 MG tablet Take 50 mg by mouth at bedtime as needed.     No current facility-administered medications for this visit.     Musculoskeletal: Strength & Muscle Tone: within normal limits Gait & Station: normal Patient leans: N/A  Psychiatric Specialty Exam: Review of Systems  Constitutional: Positive for weight loss. Negative for chills, diaphoresis, fever and malaise/fatigue.  HENT: Negative for congestion, ear discharge, ear pain, hearing loss, nosebleeds, sinus pain, sore throat and tinnitus.   Eyes: Negative for blurred vision, double vision, photophobia, pain, discharge and redness.       Astigmatism/Cataracts (Not interfering with vision yet)  Respiratory: Negative for cough, hemoptysis, sputum production, shortness of breath, wheezing and stridor.   Cardiovascular: Negative for chest pain, palpitations, orthopnea, claudication, leg swelling and PND.  Gastrointestinal: Negative for abdominal pain, blood in stool, constipation, diarrhea, heartburn, melena, nausea and vomiting.  Genitourinary: Negative for dysuria, flank pain, frequency, hematuria and urgency.  Musculoskeletal: Negative for back pain, falls, joint pain (RT TKR), myalgias and neck pain.       Hx of arthritis  Skin: Positive for rash (rOSACEA-AGGRAVATED BY ALCOHOL USE). Negative for itching.  Neurological: Negative for dizziness, tingling, tremors, sensory change, speech change, focal weakness, seizures, loss of consciousness, weakness and headaches.  Endo/Heme/Allergies: Positive for environmental allergies. Negative for polydipsia. Does not bruise/bleed easily.  Psychiatric/Behavioral: Positive for depression (Dysthymic PHQ 9) and substance abuse. Negative for hallucinations, memory  loss and suicidal ideas. The patient is nervous/anxious and has insomnia.     Blood pressure (!) 155/90, pulse 70, height 6\' 3"  (1.905 m), weight 222 lb (100.7 kg).Body mass index is 27.75 kg/m.  General Appearance: Fairly Groomed  Eye Contact:  Good  Speech:  Clear and Coherent  Volume:  Normal  Mood:  Anxious  Affect:  Congruent  Thought Process:   Coherent, Goal Directed and Descriptions of Associations: Intact  Orientation:  Full (Time, Place, and Person)  Thought Content:  WDL  Suicidal Thoughts:  No  Homicidal Thoughts:  No  Memory:  Negative (Grieving sister's death)  Judgement:  Impaired  Insight:  Fair  Psychomotor Activity:  Decreased  Concentration:  Concentration: Fair and Attention Span: Fair  Recall:  Negative  Fund of Knowledge:WDL  Language: WDL  Akathisia:  NA  Handed:  Right  AIMS (if indicated):  NA  Assets:  Desire for Improvement Financial Resources/Insurance Housing Resilience Social Support Transportation Vocational/Educational  ADL's:  Intact  Cognition: WNL  Sleep:  RX Trazodone   Screenings: AUDIT     Counselor from 09/23/2019 in [facility]  Alcohol Use Disorder Identification Test Final Score (AUDIT)  18    GAD-7     Counselor from 09/23/2019 in [place] [facility]  Total GAD-7 Score  5    PHQ2-9     Counselor from 09/23/2019 in [place]  PHQ-2 Total Score  2  PHQ-9 Total Score  5      Assessment:    63 y/o WM with severe AUD  Dependence. Seems to have some ambivalence about abstinence (beer experiment day before IOP assessment).  Treatment Plan/Recommendations:  Plan of Care: AUD/Core issues [facility] CD IOP -see Counselor's individualized plan  Laboratory:  UDS  Psychotherapy: IOP Group;Individual and family  Medications: See list.Will start baclofen Mat when he finishes Naltrexone rx  Routine PRN Medications:  Negative  Consultations: None  Safety Concerns:   Inability to abstain  Other:  None     Darlyne Russian, PA-C 09/23/2019  3:34 PM

## 2019-09-24 ENCOUNTER — Encounter (HOSPITAL_COMMUNITY): Payer: BC Managed Care – PPO

## 2019-09-28 ENCOUNTER — Ambulatory Visit (HOSPITAL_COMMUNITY): Payer: BC Managed Care – PPO | Admitting: Licensed Clinical Social Worker

## 2019-09-29 NOTE — Progress Notes (Signed)
Client calls prior to group stating he had a networking lunch previously planned for 12:15 and he would not be attending group. Client states he has not drank since last group and will plan to attend group on Wednesday.

## 2019-09-30 ENCOUNTER — Other Ambulatory Visit: Payer: Self-pay

## 2019-09-30 ENCOUNTER — Other Ambulatory Visit (HOSPITAL_COMMUNITY): Payer: BC Managed Care – PPO | Admitting: Licensed Clinical Social Worker

## 2019-09-30 DIAGNOSIS — F1021 Alcohol dependence, in remission: Secondary | ICD-10-CM | POA: Diagnosis not present

## 2019-09-30 DIAGNOSIS — F1010 Alcohol abuse, uncomplicated: Secondary | ICD-10-CM

## 2019-10-01 ENCOUNTER — Other Ambulatory Visit: Payer: Self-pay

## 2019-10-01 ENCOUNTER — Other Ambulatory Visit (HOSPITAL_COMMUNITY): Payer: BC Managed Care – PPO | Admitting: Licensed Clinical Social Worker

## 2019-10-01 DIAGNOSIS — F1021 Alcohol dependence, in remission: Secondary | ICD-10-CM | POA: Diagnosis not present

## 2019-10-01 DIAGNOSIS — F1020 Alcohol dependence, uncomplicated: Secondary | ICD-10-CM

## 2019-10-03 ENCOUNTER — Encounter (HOSPITAL_COMMUNITY): Payer: Self-pay | Admitting: Medical

## 2019-10-03 DIAGNOSIS — F1010 Alcohol abuse, uncomplicated: Secondary | ICD-10-CM | POA: Insufficient documentation

## 2019-10-03 DIAGNOSIS — F1021 Alcohol dependence, in remission: Secondary | ICD-10-CM | POA: Insufficient documentation

## 2019-10-05 ENCOUNTER — Other Ambulatory Visit: Payer: Self-pay

## 2019-10-05 ENCOUNTER — Other Ambulatory Visit (INDEPENDENT_AMBULATORY_CARE_PROVIDER_SITE_OTHER): Payer: BC Managed Care – PPO | Admitting: Medical

## 2019-10-05 ENCOUNTER — Encounter (HOSPITAL_COMMUNITY): Payer: Self-pay | Admitting: Medical

## 2019-10-05 DIAGNOSIS — R7989 Other specified abnormal findings of blood chemistry: Secondary | ICD-10-CM

## 2019-10-05 DIAGNOSIS — H52203 Unspecified astigmatism, bilateral: Secondary | ICD-10-CM

## 2019-10-05 DIAGNOSIS — H2513 Age-related nuclear cataract, bilateral: Secondary | ICD-10-CM

## 2019-10-05 DIAGNOSIS — L719 Rosacea, unspecified: Secondary | ICD-10-CM

## 2019-10-05 DIAGNOSIS — F1021 Alcohol dependence, in remission: Secondary | ICD-10-CM

## 2019-10-05 DIAGNOSIS — E161 Other hypoglycemia: Secondary | ICD-10-CM

## 2019-10-05 DIAGNOSIS — F4321 Adjustment disorder with depressed mood: Secondary | ICD-10-CM

## 2019-10-05 DIAGNOSIS — H524 Presbyopia: Secondary | ICD-10-CM

## 2019-10-05 DIAGNOSIS — Z811 Family history of alcohol abuse and dependence: Secondary | ICD-10-CM

## 2019-10-05 DIAGNOSIS — F19982 Other psychoactive substance use, unspecified with psychoactive substance-induced sleep disorder: Secondary | ICD-10-CM

## 2019-10-05 DIAGNOSIS — F411 Generalized anxiety disorder: Secondary | ICD-10-CM

## 2019-10-05 DIAGNOSIS — Z96651 Presence of right artificial knee joint: Secondary | ICD-10-CM

## 2019-10-05 DIAGNOSIS — Z889 Allergy status to unspecified drugs, medicaments and biological substances status: Secondary | ICD-10-CM

## 2019-10-05 DIAGNOSIS — D696 Thrombocytopenia, unspecified: Secondary | ICD-10-CM

## 2019-10-05 DIAGNOSIS — H5213 Myopia, bilateral: Secondary | ICD-10-CM

## 2019-10-05 DIAGNOSIS — D72810 Lymphocytopenia: Secondary | ICD-10-CM

## 2019-10-05 NOTE — Progress Notes (Signed)
   [facility] Follow-up Outpatient CDIOP Date: 10/05/19  Admission Date:09/23/2019  Sobriety date:09/22/2019  Subjective: "I'm good"  HPI : Initial CD IOP Provider FU visit Pt is seen 10 days S/P admission to CD IOP and has begun his Baclofen rx.He had some "queasiness" yesterday taking 2nd dose but none today and thinks it was "psychological" as he is not used to being on multiple medications he self administers. When he was "at the [facility] they just handed (him) 4 0r 5 pills in a cup" and he took them. His wife is a Marine scientist and is helping. He has remained abstinent since his 2 beer experiment 12/1. He has an appt with his PCP tomorrow.  He is given URICA 32 and will return it Wednesday.  Review of Systems: Psychiatric: Agitation: No Hallucination: No Depressed Mood: Yes much better Insomnia: Trazodone HS PRN Hypersomnia: No Altered Concentration: No Feels Worthless: No  Grandiose Ideas: No Belief In Special Powers: No New/Increased Substance Abuse: No Compulsions: No  Neurologic: Headache: No Seizure: No Paresthesias: No  Current Medications: Azelaic Acid 15 % cream Apply 1 application topically 2 (two) times daily.  baclofen 10 MG tablet Commonly known as: LIORESAL Take 1 tablet (10 mg total) by mouth 3 (three) times daily.  doxycycline 100 MG tablet Commonly known as: VIBRA-TABS Take 100 mg by mouth daily.  ergocalciferol 1.25 MG (50000 UT) capsule Commonly known as: VITAMIN D2 ergocalciferol (vitamin D2) 1,250 mcg (50,000 unit) capsule  escitalopram 10 MG tablet Commonly known as: LEXAPRO Take 10 mg by mouth daily.  ondansetron 4 MG disintegrating tablet Commonly known as: Zofran ODT Take 1 tablet (4 mg total) by mouth every 8 (eight) hours as needed for nausea or vomiting.  QC Multi-Vite 50 & Over Tabs TAKE ONE TABLET EVERY DAY  thiamine 100 MG tablet TAKE ONE TABLET EVERY DAY  traZODone 50 MG tablet Commonly known as: DESYREL Take 50 mg by mouth at  bedtime as needed   Mental Status Examination  Appearance:Rosacea continues to look improved Alert: Yes Attention: good  Cooperative: Yes Eye Contact: Good Speech: Clear and coherent Psychomotor Activity: Hyperactive (may be his baseline?) Memory/Concentration: Normal/intact Oriented: person, place, time/date and situation Mood: Euthymic Affect: Appropriate and Congruent Thought Processes and Associations: Coherent and Intact Fund of Knowledge: WDL Thought Content: WDL Insight: Limited Judgement: Early recovery  [id number]  PDMP-Negative  Diagnosis:  0 Alcohol use disorder, severe, in early remission, in controlled environment (HCC) 0 Elevated LFTs 0 Anxiety state 0 Substance or medication-induced sleep disorder, insomnia type ([place]) 0 Unresolved grief 0 Family history of alcohol abuse and dependence 0 Rosacea 0 History of seasonal allergies 0 Status post total right knee replacement 0 Myopia of both eyes with astigmatism and presbyopia 0 Age-related nuclear cataract of both eyes 0 Thrombocytopenia (HCC) 0 Lymphocytopenia 0 Hypoglycemic reaction  Assessment: Well in early recovery so far.Await URICA for level of motivation.Continue per admisssion  Treatment Plan: Darlyne Russian, PA-CPatient ID: Earline Mayotte, male   DOB: 16-Feb-1956, 63 y.o.   MRN: [id number]

## 2019-10-06 NOTE — Progress Notes (Signed)
    Daily Group Progress Note  Program: CD-IOP   Group Time: 1pm-2:30pm Participation Level: Active Behavioral Response: Appropriate and Rationalizing Type of Therapy: Process Group Topic: Clinician checked in with group members, assessing for SI/HI/psychosis and overall level of functioning. Clinician and group members processed events of the weekend, including highlights and moments of struggle. Clinician and group members processed related thoughts and feelings and coping skills used to address. Clinician and group members read and processed Daily Reflection and relation to current stage of recovery.   Group Time: 2:30pm-4pm Participation Level: Active Behavioral Response: Sharing and Rationalizing Type of Therapy: Psycho-education Group  Topic: Clinician provided guided relaxation skill in session. Clinician presented psycho-educational information on 'Addictive Personalities' using supporting material from The Substance Abuse and Recovery Workbook. Clinician and group members discussed personality traits relating to self-esteem, relationship dynamics, life skills, and excitement. Clinician and group members discussed changes in risk taking in recovery vs active addiction. Clinician inquired a self-care activity planned before next group.   Summary: Client reports minimal addictive behaviors. Client is somewhat receptive to feedback about continued drinking of beer. Client identifies that being alone and without accountability can be a problem. Client is passively engaged during group but does appear to being paying attention overall. Client shared missing group due to taking a lunch meeting. Client reports recovery and sobriety is important, but so is being able to work and pay bills.   Family Program: Family present? No   Name of family member(s): NA  UDS collected: No   AA/NA attended?: Yes reports 2 mtgs daily  Sponsor?: Yes   Olegario Messier, LCSW

## 2019-10-06 NOTE — Progress Notes (Signed)
    Daily Group Progress Note  Program: CD-IOP   Group Time: 1pm-2:30pm  Participation Level: Active  Behavioral Response: Sharing and Resistant  Type of Therapy: Process Group  Topic: Clinician checked in with group members, assessing for SI/HI/psychosis and overall level of functioning Clinician and group members processed recent stressors and effect on recovery. Clinician and group member read and processed Daily reflection and Just for Today readings. Clinician provided mindfulness activity.   Group Time: 2:30pm-4pm  Participation Level: Active  Behavioral Response: Appropriate  Type of Therapy: Psycho-education Group  Topic: Clinician presented the topic of Guilt and Shame. Clinician facilitated conversation on differences in guilt and shame and focusing on using self-compassion to reject self-criticism. Clinician utilized Almond Lint video as supplemental material. Group members reviewed suggestions for coping with feelings of guilt and ways to help avoid and address shame.   Summary: Client reports some guilt over behaviors while intoxicated and effect on family members. Client states he has had increased stress since being released from [facility] due to moving and memories of the home. Client states this is why he had a beer, 'just to try it' and calm down from multiple changes. Client states not feeling much shame because he is a good person and trying to change behaviors. Client struggles to maintain sobriety and has drank twice since completing residential treatment. Client rationalizes behaviors by stating he is drinking beer and not vodka. Client is somewhat receptive to group leader and members reflecting that alcohol is the same in any form. Client met with PA, see note for additional details.   Family Program: Family present? No   Name of family member(s): NA  UDS collected: No   AA/NA attended?: Yes; client reports attending 2 meetings daily and is working  to find Radiation protection practitioner?: Yes   Olegario Messier, LCSW

## 2019-10-07 ENCOUNTER — Encounter (HOSPITAL_COMMUNITY): Payer: BC Managed Care – PPO

## 2019-10-07 ENCOUNTER — Telehealth (HOSPITAL_COMMUNITY): Payer: Self-pay | Admitting: Licensed Clinical Social Worker

## 2019-10-08 ENCOUNTER — Encounter (HOSPITAL_COMMUNITY): Payer: BC Managed Care – PPO

## 2019-10-12 ENCOUNTER — Telehealth (HOSPITAL_COMMUNITY): Payer: Self-pay | Admitting: Licensed Clinical Social Worker

## 2019-10-12 ENCOUNTER — Encounter (HOSPITAL_COMMUNITY): Payer: BC Managed Care – PPO

## 2019-10-13 ENCOUNTER — Telehealth (HOSPITAL_COMMUNITY): Payer: Self-pay | Admitting: Licensed Clinical Social Worker

## 2019-10-14 ENCOUNTER — Ambulatory Visit (INDEPENDENT_AMBULATORY_CARE_PROVIDER_SITE_OTHER): Payer: BC Managed Care – PPO | Admitting: Medical

## 2019-10-14 ENCOUNTER — Encounter (HOSPITAL_COMMUNITY): Payer: Self-pay | Admitting: Medical

## 2019-10-14 DIAGNOSIS — F1021 Alcohol dependence, in remission: Secondary | ICD-10-CM | POA: Diagnosis not present

## 2019-10-14 DIAGNOSIS — R7989 Other specified abnormal findings of blood chemistry: Secondary | ICD-10-CM | POA: Diagnosis not present

## 2019-10-14 DIAGNOSIS — L719 Rosacea, unspecified: Secondary | ICD-10-CM

## 2019-10-14 DIAGNOSIS — F19982 Other psychoactive substance use, unspecified with psychoactive substance-induced sleep disorder: Secondary | ICD-10-CM

## 2019-10-14 DIAGNOSIS — F411 Generalized anxiety disorder: Secondary | ICD-10-CM | POA: Diagnosis not present

## 2019-10-14 DIAGNOSIS — Z9119 Patient's noncompliance with other medical treatment and regimen: Secondary | ICD-10-CM

## 2019-10-14 DIAGNOSIS — F4321 Adjustment disorder with depressed mood: Secondary | ICD-10-CM | POA: Diagnosis not present

## 2019-10-14 DIAGNOSIS — Z889 Allergy status to unspecified drugs, medicaments and biological substances status: Secondary | ICD-10-CM

## 2019-10-14 DIAGNOSIS — E161 Other hypoglycemia: Secondary | ICD-10-CM

## 2019-10-14 DIAGNOSIS — Z811 Family history of alcohol abuse and dependence: Secondary | ICD-10-CM

## 2019-10-14 NOTE — Progress Notes (Signed)
[facility] OUTPATIENT                                                      Discharge Summary                                                                                                                                                                     Date of Admission: 09/23/2019 [facility]all Provider: [facility]                                  PCP:Le Exie Parody Dr Nicki Reaper  Date of Discharge: 10/14/19 Sobriety Date:Doesnt have one - claims he will stop after New Year Admission Diagnosis: 1. Alcohol use disorder, severe, in early remission, in controlled environment ([place])  F10.21   2. Elevated LFTs  R79.89    Probale alcohol hepatitis  3. Anxiety state  F41.1   4. Substance or medication-induced sleep disorder, insomnia type ([place])  F19.982   5. Unresolved grief  F43.21    Sister  6. Family history of alcohol abuse and dependence  Z81.1   7. Rosacea  L71.9   8. History of seasonal allergies  Z88.9   9. Status post total right knee replacement  Z96.651   10. Myopia of both eyes with astigmatism and presbyopia  H52.13    H52.203    H52.4   11. Age-related nuclear cataract of both eyes  H25.13   12. Thrombocytopenia (HCC)  D69.6   13. Lymphocytopenia  D72.810   14. Hypoglycemic reaction  E16.1    Glucose 60 in ED 10/29 per provider note    Course of Treatment: Pt attended 2 Groups and called Counselor requesting to be discharged because he diidnt need to be in a group of heroin/drug addicts. Claimed he was going to [place] and meeting with his sponsors who advised him of the same.Pt wife and daughetr disclosed patient had returned to drink. As noted he informed Counselor he had too many stressors to stop drinking now. He was recommended to return to a higher level of care but refused. He agreed  to meet with OP  Counselor here.  Medications: Azelaic Acid 15 % cream Apply 1 application topically 2 (two) times daily.  baclofen 10 MG tablet Commonly known as: LIORESAL Take 1 tablet (10 mg total) by mouth 3 (three) times daily.  doxycycline 100 MG tablet Commonly known as: VIBRA-TABS Take 100 mg by mouth daily.  ergocalciferol 1.25 MG (50000 UT) capsule Commonly known as: VITAMIN D2 ergocalciferol (vitamin D2) 1,250 mcg (50,000 unit) capsule  escitalopram 10 MG tablet Commonly known as: LEXAPRO Take 10 mg by mouth daily.  ondansetron 4 MG disintegrating tablet Commonly known as: Zofran ODT Take 1 tablet (4 mg total) by mouth every 8 (eight) hours as needed for nausea or vomiting.  QC Multi-Vite 50 & Over Tabs TAKE ONE TABLET EVERY DAY  thiamine 100 MG tablet TAKE ONE TABLET EVERY DAY  traZODone 50 MG tablet Commonly known as: DESYREL Take 50 mg by mouth at bedtime as needed.    Discharge Diagnosis:  Plan of Action to Address Continuing Problems:  Goals and Activities to Help Maintain Sobriety: 1. Stay away from people ,places and things that are triggers 2. Continue practicing Fair Fighting rules in interpersonal conflicts. 3. Continue alcohol and drug refusal skills and call on support system  4. Attend AA meetings AT LEAST as often as you use  5. Obtain a sponsor and a home group in [place]. 6. Return to [facility] (Pt refused)  Referrals:  OP Counseling Chryl Heck at pt request  Next appointment: 10/21/19  Prognosis: Poor    Client has NOT participated in the development of this discharge plan and has NOT received a copy of this completed plan

## 2019-10-15 NOTE — Progress Notes (Signed)
    Daily Group Progress Note  Program: CD-IOP   Group Time: 1pm-2pm Participation Level: Active Behavioral Response: Appropriate Type of Therapy: Process Group   Topic: Clinician checked in with group members, assessing for SI/HI/psychosis and overall level of functioning. Clinician and group members discussed any trouble with cravings or barriers to successful recovery. Clinician and group members reviewed and discussed AA Daily Reflection and NA Just for today and processed comments in relation to current stage in recovery.      Group Time: 2pm-4pm Participation Level: Active Behavioral Response: Appropriate Type of Therapy: Psycho-education Group   Topic: Clinician utilized psycho-educational session to focus on re-wiring the brain. Clinician utilized supportive material from Dr Halford Chessman 'Happy Brain: How to Overcome Our Neural Predisposition to Suffering.' Clinician and group members discussed natural bias and ways to support healthy thinking.  Clinician and group members practiced adding moments of mindfulness, gratitude, and self-compassion into daily life to support improved mood and long term positive interactions with the world. Clinician and group members reviewed HALT the BS and making decisions based on uncomfortable emotions, including avoiding feelings of abandonment.   Summary: Client reports sobriety date of 09/28/19. Client verbalizes minimal concerns about ongoing drinking due to not being his 'alcohol of choice.' Client rationalizes ongoing behavior and as minimally receptive to challenges from group members or clinician. Client appears dis-engaged in most conversations. Client did engage briefly about making decisions based on fear of being alone due to wife and daughter recently setting boundaries related to their involvement with him and sobriety.   Family Program: Family present? No   Name of family member(s): NA  UDS collected: Yes Results: 'quantity not sufficient  for testing'  AA/NA attended?: Yes per client report attending a meeting a day and frustration IOP interrupts ability to attend meeting at lunch, which he finds more helpful.  Sponsor?: Yes   Olegario Messier, LCSW

## 2019-10-19 ENCOUNTER — Encounter (HOSPITAL_COMMUNITY): Payer: BC Managed Care – PPO

## 2019-10-19 NOTE — Telephone Encounter (Signed)
Client non-compliant with CDIOP program. Referred to alternate level of care.

## 2019-10-21 ENCOUNTER — Encounter (HOSPITAL_COMMUNITY): Payer: BC Managed Care – PPO

## 2019-10-21 ENCOUNTER — Other Ambulatory Visit: Payer: Self-pay

## 2019-10-21 ENCOUNTER — Ambulatory Visit (INDEPENDENT_AMBULATORY_CARE_PROVIDER_SITE_OTHER): Payer: BC Managed Care – PPO | Admitting: Licensed Clinical Social Worker

## 2019-10-21 DIAGNOSIS — F1020 Alcohol dependence, uncomplicated: Secondary | ICD-10-CM | POA: Diagnosis not present

## 2019-10-21 NOTE — Progress Notes (Signed)
THERAPIST PROGRESS NOTE  Session Time: 9:15am - 10:00am  Participation Level: Active   Behavioral Response: Alert, casually dressed, anxious/irritable mood  Type of Therapy:  Individual Therapy  Treatment Goals addressed:  Relapse prevention and maintaining abstinence   Interventions: CBT, relapse prevention   Summary: Danny Kerr, who prefers to go by "Danny Kerr" presented today for first individual therapy session.  Danny Kerr presented 15 minutes late and became irritable when punctuality was brought up, stating "Don't fuck with me".  He presented alert, oriented x5, with no evidence or self report of SI/HI or A/V H.  Danny Kerr reported that he last drank 2-3 days ago, and denied any feelings of depression or anxiety at this time, although he admitted to several stressors, such as struggling with sobriety following completion of [facility] in late November, cutting ties with drinking friends, losing his sister during treatment, his wife leaving him, and his daughter ceasing contact with him.  Danny Kerr reported that he was attending IOP through [facility], but found that he could not relate to the other peers, and wants to speak with people that are in his age range, and specifically struggling with alcohol use.  Danny Kerr reported that he is tired of drinking and hopes to achieve abstinence through individual therapy, attending 12-step meetings at least once per day, networking with his two sponsors, and curbing boredom during the pandemic, which is his biggest trigger. He reported that he needs to find productive ways to spend his time, and agreed to explore hobbies from the past to keep his mind engaged and avoid relapse again as he processes recent struggles.    Suicidal/Homicidal: None, without plan or intent   Therapist Response: Clinician met with Danny Kerr today for individual therapy.  Clinician asked Danny Kerr to call ahead next time if he believes he will be late or consider rescheduling, since this reduces the  allowable time for processing in sessions.  Clinician assessed for safety and sobriety.  Clinician inquired about Danny Kerr's present thoughts, feelings, and changes in behavior since he last spoke with previous therapist.  Clinician empathized with Danny Kerr regarding the things he is currently struggling with, and inquired about what steps he has been taking to curb his alcohol use, as well as coping skills that have been successful in the past.  Clinician encouraged Danny Kerr to maintain attendance to support meetings at least once daily, as well as speaking with sponsors, and consider step work as an additional goal.  Clinician brainstormed strategies for addressing boredom with Danny Kerr, such as getting back into golfing, exercising, or volunteering.  Clinician encouraged Danny Kerr to begin journaling about his daily thoughts and feelings to get more in touch with his emotions rather than to attempt suppression with alcohol.  Clinician will continue to monitor.  Plan: Meet again in 1 week.    Diagnosis: Alcohol use disorder, severe dependence   Shade Flood, [place], [place] 10/21/19

## 2019-10-22 ENCOUNTER — Encounter (HOSPITAL_COMMUNITY): Payer: BC Managed Care – PPO

## 2019-10-28 ENCOUNTER — Other Ambulatory Visit: Payer: Self-pay

## 2019-10-28 ENCOUNTER — Encounter (HOSPITAL_COMMUNITY): Payer: BC Managed Care – PPO

## 2019-10-28 ENCOUNTER — Ambulatory Visit (HOSPITAL_COMMUNITY): Payer: BC Managed Care – PPO | Admitting: Licensed Clinical Social Worker

## 2019-10-29 ENCOUNTER — Encounter (HOSPITAL_COMMUNITY): Payer: BC Managed Care – PPO

## 2019-11-02 ENCOUNTER — Encounter (HOSPITAL_COMMUNITY): Payer: BC Managed Care – PPO

## 2019-11-04 ENCOUNTER — Other Ambulatory Visit: Payer: Self-pay

## 2019-11-04 ENCOUNTER — Encounter (HOSPITAL_COMMUNITY): Payer: BC Managed Care – PPO

## 2019-11-04 ENCOUNTER — Ambulatory Visit (INDEPENDENT_AMBULATORY_CARE_PROVIDER_SITE_OTHER): Payer: BC Managed Care – PPO | Admitting: Licensed Clinical Social Worker

## 2019-11-04 DIAGNOSIS — F1020 Alcohol dependence, uncomplicated: Secondary | ICD-10-CM | POA: Diagnosis not present

## 2019-11-04 NOTE — Progress Notes (Signed)
THERAPIST PROGRESS NOTE  Session Time: 1:00pm - 2:00pm  Participation Level: Active   Behavioral Response: Alert, casually dressed, neatly groomed, anxious and depressed mood  Type of Therapy:  Individual Therapy  Treatment Goals addressed:  Relapse prevention and maintaining abstinence   Interventions: CBT, relapse prevention   Summary: Danny Kerr, who prefers to go by [name]" presented today for individual therapy session.  [name] reported that he last drank "A couple of beers" two days ago and admitted to regular lapses, but was guarded about frequency and specific quantity of use.  [name] reported scores of 5/10 for depression and anxiety today.  [name] stated "I miss my wife, my kids, and I'm still trying to get over my sister's death".  [name] reported that he is trying to focus on 'good news' lately, and learned that his wife is attending Al-Anon meetings, which he considers positive, since a friend suggested that this means she wants to learn how to help him with his addiction.  [name] reported that his biggest trigger at this time is boredom, stating "I have too much time on my hands".  [name] reported that he is currently attending virtual and physical AA meetings twice per day, cut ties with 'drinking buddies', continues to try to stay in touch with family and sober friends daily, and is exploring new hobbies, such as shooting at a gun range with someone he met from a meeting. [name] reported that he would increase effort to improve schedule this week, stating "I'm sick and tired of it and want my family back".  He declined recommendation of higher level of care due to financial situation and stated "I want to try to do this one on one right now".  He agreed to follow up in 1 week.     Suicidal/Homicidal: None, without plan or intent    Therapist Response: Clinician met with [name] today for individual therapy.  Clinician assessed for safety and sobriety.  Clinician inquired about Fred's present  emotional ratings at this time, as well as significant changes in thoughts, feelings, or behavior since last meeting.  Clinician empathized with [name] regarding ongoing stressors in life, and inquired about present relapse prevention plan in place to promote abstinence from alcohol. Clinician worked with [name] to identify changes that could be made to routine to address his boredom, including increasing attendance to meetings, as well as connections to sober peers he meets in these settings, exploring new and old hobbies such as exercise, golfing, and cycling.  Clinician encouraged [name] to consider readmission to a residential facility if financially feasible, given pattern of drinking and severity of stressors at this time, and offered to assist with resources/referrals.  Clinician will continue to recommend higher level of care if warranted and continue to monitor.  Plan: Meet again in 1 week.    Diagnosis: Alcohol use disorder, severe dependence   Shade Flood, [place], [place] 11/04/19

## 2019-11-05 ENCOUNTER — Encounter (HOSPITAL_COMMUNITY): Payer: BC Managed Care – PPO

## 2019-11-09 ENCOUNTER — Encounter (HOSPITAL_COMMUNITY): Payer: BC Managed Care – PPO

## 2019-11-11 ENCOUNTER — Encounter (HOSPITAL_COMMUNITY): Payer: BC Managed Care – PPO

## 2019-11-12 ENCOUNTER — Encounter (HOSPITAL_COMMUNITY): Payer: BC Managed Care – PPO

## 2019-11-16 ENCOUNTER — Encounter (HOSPITAL_COMMUNITY): Payer: BC Managed Care – PPO

## 2019-11-18 ENCOUNTER — Encounter (HOSPITAL_COMMUNITY): Payer: BC Managed Care – PPO

## 2019-11-19 ENCOUNTER — Encounter (HOSPITAL_COMMUNITY): Payer: BC Managed Care – PPO

## 2019-11-23 ENCOUNTER — Other Ambulatory Visit: Payer: Self-pay

## 2019-11-23 ENCOUNTER — Ambulatory Visit (INDEPENDENT_AMBULATORY_CARE_PROVIDER_SITE_OTHER): Payer: BC Managed Care – PPO | Admitting: Licensed Clinical Social Worker

## 2019-11-23 DIAGNOSIS — F1020 Alcohol dependence, uncomplicated: Secondary | ICD-10-CM | POA: Diagnosis not present

## 2019-11-23 NOTE — Progress Notes (Signed)
THERAPIST PROGRESS NOTE  Session Time:2:00pm- 3:00pm  Participation Level:Active  Behavioral Response:Alert,casually dressed, neatly groomed, combined anxious and depressed mood/affect  Type of Therapy: Individual Therapy  Treatment Goals addressed:Relapse prevention and maintaining abstinence  Interventions:CBT, relapse prevention   Summary: Danny Kerr, who prefers to go by Danny Kerr" presented for individual therapy session. Danny Kerr reported that he continues drinking almost every day and tends to have "A few beers and a glass of vodka" on average, but admitted that he doesn't receive any enjoyment from this habit any longer.  Danny Kerr reported scores of 6/10 for depression and anxiety today.  Danny Kerr reported that boredom remains his most prominent trigger, in addition to contact with old friends and being around certain bars.  Danny Kerr reported that he has not explored hobbies of exercise or golf, and even went to hang out with friends a few nights ago while they were drinking, although he opted for a virgin Bloody Mary.  Danny Kerr reported that he wants to cut ties with old friends as he is realizing he no longer enjoys that lifestyle, but he is lonely, and his wife still doesn't talk to him much.  Danny Kerr reported that he attends AA meetings twice per day (one virtual and one physical), has a sponsor, two recovery friends, and a sponsor, but does not feel like anyone will truly let him into the community yet since he cannot stay sober.  Danny Kerr was agreeable to trying to develop a daily schedule and admitted that physical exercise would benefit him with several goals, in addition to continuing active job search each day.  He reported that he does not believe he can control his drinking successfully, so he wants to quit completely rather than moderate it.  He reported that he has no alcohol at home and will not go to any stores tonight or contact old drinking buddies.  Danny Kerr reported that he would also try to  keep his fridge stocked with water as a substitute for alcohol when craving and keep TV dinners available so he is less likely to go out to restaurants which serve alcohol.  Danny Kerr agreed to follow up in 1 week.     Suicidal/Homicidal: None, without plan or intent    Therapist Response: Clinician met with Danny Kerr today for individual therapy in person. Clinician assessed for safety and sobriety.  Clinician inquired about Danny Kerr's emotional ratings today, as well as significant changes in thoughts, feelings, or behavior since previous session. Clinician inquired about Danny Kerr's present barriers to sobriety, and whether he has been managing exposure to triggers, exploring new coping skills, and/or staying engaged in recovery network. Clinician discussed the importance of keeping a schedule of healthy, positives activities each day to alleviate boredom if this continues to be Danny Kerr's biggest trigger, and encouraged him to avoid contact with active users since he could face relapse justifications as a result.  Clinician discussed importance of taking recovery one day at a time to avoid becoming overwhelmed, and weighed options of harm reduction, versus quitting cold [place].  Clinician encouraged Danny Kerr to stay connected with his support network and call his sponsor or sober friends when he considers drinking to interrupt the relapse cycle.  Clinician will continue to monitor.    Plan:Meet again in1week.   Diagnosis: Alcohol use disorder, severe dependence  Shade Flood, LCSW, [place] 11/23/19

## 2019-11-30 ENCOUNTER — Other Ambulatory Visit: Payer: Self-pay

## 2019-11-30 ENCOUNTER — Ambulatory Visit (HOSPITAL_COMMUNITY): Payer: BC Managed Care – PPO | Admitting: Licensed Clinical Social Worker

## 2019-12-07 ENCOUNTER — Ambulatory Visit (HOSPITAL_COMMUNITY): Payer: BC Managed Care – PPO | Admitting: Licensed Clinical Social Worker

## 2019-12-07 ENCOUNTER — Other Ambulatory Visit: Payer: Self-pay

## 2019-12-12 ENCOUNTER — Encounter (HOSPITAL_COMMUNITY): Payer: Self-pay | Admitting: Internal Medicine

## 2019-12-12 ENCOUNTER — Inpatient Hospital Stay (HOSPITAL_COMMUNITY)
Admission: EM | Admit: 2019-12-12 | Discharge: 2019-12-19 | DRG: 897 | Disposition: A | Discharge status: Home Health | Payer: BC Managed Care – PPO | Attending: Internal Medicine | Admitting: Internal Medicine

## 2019-12-12 ENCOUNTER — Other Ambulatory Visit: Payer: Self-pay

## 2019-12-12 DIAGNOSIS — L711 Rhinophyma: Secondary | ICD-10-CM | POA: Diagnosis present

## 2019-12-12 DIAGNOSIS — F10939 Alcohol use, unspecified with withdrawal, unspecified: Secondary | ICD-10-CM | POA: Diagnosis present

## 2019-12-12 DIAGNOSIS — R441 Visual hallucinations: Secondary | ICD-10-CM | POA: Diagnosis present

## 2019-12-12 DIAGNOSIS — F10930 Alcohol use, unspecified with withdrawal, uncomplicated: Secondary | ICD-10-CM

## 2019-12-12 DIAGNOSIS — D696 Thrombocytopenia, unspecified: Secondary | ICD-10-CM | POA: Diagnosis present

## 2019-12-12 DIAGNOSIS — Z96651 Presence of right artificial knee joint: Secondary | ICD-10-CM | POA: Diagnosis present

## 2019-12-12 DIAGNOSIS — F10239 Alcohol dependence with withdrawal, unspecified: Secondary | ICD-10-CM | POA: Diagnosis present

## 2019-12-12 DIAGNOSIS — R030 Elevated blood-pressure reading, without diagnosis of hypertension: Secondary | ICD-10-CM | POA: Diagnosis present

## 2019-12-12 DIAGNOSIS — Z79899 Other long term (current) drug therapy: Secondary | ICD-10-CM | POA: Diagnosis not present

## 2019-12-12 DIAGNOSIS — Z801 Family history of malignant neoplasm of trachea, bronchus and lung: Secondary | ICD-10-CM | POA: Diagnosis not present

## 2019-12-12 DIAGNOSIS — G629 Polyneuropathy, unspecified: Secondary | ICD-10-CM | POA: Diagnosis present

## 2019-12-12 DIAGNOSIS — Z820 Family history of epilepsy and other diseases of the nervous system: Secondary | ICD-10-CM | POA: Diagnosis not present

## 2019-12-12 DIAGNOSIS — F10230 Alcohol dependence with withdrawal, uncomplicated: Secondary | ICD-10-CM

## 2019-12-12 DIAGNOSIS — Z87891 Personal history of nicotine dependence: Secondary | ICD-10-CM

## 2019-12-12 DIAGNOSIS — Z20822 Contact with and (suspected) exposure to covid-19: Secondary | ICD-10-CM | POA: Diagnosis present

## 2019-12-12 DIAGNOSIS — R5381 Other malaise: Secondary | ICD-10-CM | POA: Diagnosis present

## 2019-12-12 DIAGNOSIS — F10231 Alcohol dependence with withdrawal delirium: Secondary | ICD-10-CM | POA: Diagnosis present

## 2019-12-12 DIAGNOSIS — E876 Hypokalemia: Secondary | ICD-10-CM | POA: Diagnosis not present

## 2019-12-12 LAB — CBC WITH DIFFERENTIAL/PLATELET
Abs Immature Granulocytes: 0.02 10*3/uL (ref 0.00–0.07)
Basophils Absolute: 0 10*3/uL (ref 0.0–0.1)
Basophils Relative: 1 %
Eosinophils Absolute: 0.2 10*3/uL (ref 0.0–0.5)
Eosinophils Relative: 3 %
HCT: 43.1 % (ref 39.0–52.0)
Hemoglobin: 15 g/dL (ref 13.0–17.0)
Immature Granulocytes: 0 %
Lymphocytes Relative: 4 %
Lymphs Abs: 0.2 10*3/uL — ABNORMAL LOW (ref 0.7–4.0)
MCH: 32.8 pg (ref 26.0–34.0)
MCHC: 34.8 g/dL (ref 30.0–36.0)
MCV: 94.1 fL (ref 80.0–100.0)
Monocytes Absolute: 0.3 10*3/uL (ref 0.1–1.0)
Monocytes Relative: 5 %
Neutro Abs: 4.8 10*3/uL (ref 1.7–7.7)
Neutrophils Relative %: 87 %
Platelets: 96 10*3/uL — ABNORMAL LOW (ref 150–400)
RBC: 4.58 MIL/uL (ref 4.22–5.81)
RDW: 15 % (ref 11.5–15.5)
WBC: 5.5 10*3/uL (ref 4.0–10.5)
nRBC: 0 % (ref 0.0–0.2)

## 2019-12-12 LAB — COMPREHENSIVE METABOLIC PANEL
ALT: 34 U/L (ref 0–44)
AST: 101 U/L — ABNORMAL HIGH (ref 15–41)
Albumin: 4.4 g/dL (ref 3.5–5.0)
Alkaline Phosphatase: 117 U/L (ref 38–126)
Anion gap: 21 — ABNORMAL HIGH (ref 5–15)
BUN: 12 mg/dL (ref 8–23)
CO2: 20 mmol/L — ABNORMAL LOW (ref 22–32)
Calcium: 9 mg/dL (ref 8.9–10.3)
Chloride: 97 mmol/L — ABNORMAL LOW (ref 98–111)
Creatinine, Ser: 0.62 mg/dL (ref 0.61–1.24)
GFR calc Af Amer: >60 mL/min (ref >60)
GFR calc non Af Amer: >60 mL/min (ref >60)
Glucose, Bld: 92 mg/dL (ref 70–99)
Potassium: 3.7 mmol/L (ref 3.5–5.1)
Sodium: 138 mmol/L (ref 135–145)
Total Bilirubin: 2 mg/dL — ABNORMAL HIGH (ref 0.3–1.2)
Total Protein: 7.6 g/dL (ref 6.5–8.1)

## 2019-12-12 LAB — RAPID URINE DRUG SCREEN, HOSP PERFORMED
Amphetamines: NOT DETECTED
Barbiturates: NOT DETECTED
Benzodiazepines: POSITIVE — AB
Cocaine: NOT DETECTED
Opiates: NOT DETECTED
Tetrahydrocannabinol: NOT DETECTED

## 2019-12-12 LAB — MAGNESIUM: Magnesium: 1.7 mg/dL (ref 1.7–2.4)

## 2019-12-12 LAB — ETHANOL: Alcohol, Ethyl (B): 49 mg/dL — ABNORMAL HIGH (ref <10)

## 2019-12-12 LAB — VITAMIN B12: Vitamin B-12: 287 pg/mL (ref 180–914)

## 2019-12-12 MED ORDER — ACETAMINOPHEN 325 MG PO TABS: 650.0000 mg | ORAL_TABLET | Freq: Four times a day (QID) | ORAL | Status: DC | PRN

## 2019-12-12 MED ORDER — ONDANSETRON HCL 4 MG/2ML IJ SOLN
4.0000 mg | Freq: Four times a day (QID) | INTRAMUSCULAR | Status: DC | PRN
  Administered 2019-12-13 – 2019-12-14 (×2): 4 mg via INTRAVENOUS
  Filled 2019-12-12 (×3): qty 2

## 2019-12-12 MED ORDER — THIAMINE HCL 100 MG/ML IJ SOLN: 100.0000 mg | Freq: Every day | INTRAMUSCULAR | Status: DC

## 2019-12-12 MED ORDER — PANTOPRAZOLE SODIUM 40 MG IV SOLR: 40.0000 mg | Freq: Every morning | INTRAVENOUS | Status: DC

## 2019-12-12 MED ORDER — THIAMINE HCL 100 MG PO TABS
100.0000 mg | ORAL_TABLET | Freq: Every day | ORAL | Status: DC
  Administered 2019-12-12 – 2019-12-19 (×8): 100 mg via ORAL
  Filled 2019-12-12 (×8): qty 1

## 2019-12-12 MED ORDER — LORAZEPAM 2 MG/ML IJ SOLN
0.0000 mg | Freq: Four times a day (QID) | INTRAMUSCULAR | Status: DC
  Administered 2019-12-12: 21:00:00 4 mg via INTRAVENOUS
  Administered 2019-12-13 – 2019-12-14 (×2): 2 mg via INTRAVENOUS
  Filled 2019-12-12: qty 2
  Filled 2019-12-12 (×3): qty 1

## 2019-12-12 MED ORDER — FOLIC ACID 1 MG PO TABS
1.0000 mg | ORAL_TABLET | Freq: Every day | ORAL | Status: DC
  Administered 2019-12-12 – 2019-12-19 (×8): 1 mg via ORAL
  Filled 2019-12-12 (×8): qty 1

## 2019-12-12 MED ORDER — ACETAMINOPHEN 650 MG RE SUPP: 650.0000 mg | Freq: Four times a day (QID) | RECTAL | Status: DC | PRN

## 2019-12-12 MED ORDER — ESCITALOPRAM OXALATE 10 MG PO TABS: 10.0000 mg | ORAL_TABLET | Freq: Every day | ORAL | Status: DC

## 2019-12-12 MED ORDER — TRAZODONE HCL 50 MG PO TABS
50.0000 mg | ORAL_TABLET | Freq: Every evening | ORAL | Status: DC | PRN
  Administered 2019-12-14: 50 mg via ORAL
  Filled 2019-12-12: qty 1

## 2019-12-12 MED ORDER — ESCITALOPRAM OXALATE 10 MG PO TABS
10.0000 mg | ORAL_TABLET | Freq: Every day | ORAL | Status: DC
  Administered 2019-12-13 – 2019-12-19 (×7): 10 mg via ORAL
  Filled 2019-12-12 (×7): qty 1

## 2019-12-12 MED ORDER — LORAZEPAM 2 MG/ML IJ SOLN
2.0000 mg | Freq: Once | INTRAMUSCULAR | Status: AC
  Administered 2019-12-12: 2 mg via INTRAVENOUS
  Filled 2019-12-12: qty 1

## 2019-12-12 MED ORDER — ADULT MULTIVITAMIN W/MINERALS CH
1.0000 | ORAL_TABLET | Freq: Every day | ORAL | Status: DC
  Administered 2019-12-12 – 2019-12-19 (×8): 1 via ORAL
  Filled 2019-12-12 (×9): qty 1

## 2019-12-12 MED ORDER — LORAZEPAM 1 MG PO TABS
1.0000 mg | ORAL_TABLET | ORAL | Status: DC | PRN
  Filled 2019-12-12: qty 1

## 2019-12-12 MED ORDER — CLONIDINE HCL 0.1 MG PO TABS
0.1000 mg | ORAL_TABLET | Freq: Four times a day (QID) | ORAL | Status: DC | PRN
  Filled 2019-12-12: qty 1

## 2019-12-12 MED ORDER — ONDANSETRON HCL 4 MG PO TABS: 4.0000 mg | ORAL_TABLET | Freq: Four times a day (QID) | ORAL | Status: DC | PRN

## 2019-12-12 MED ORDER — LORAZEPAM 2 MG/ML IJ SOLN
1.0000 mg | INTRAMUSCULAR | Status: DC | PRN
  Administered 2019-12-12: 1 mg via INTRAVENOUS
  Administered 2019-12-13: 4 mg via INTRAVENOUS
  Administered 2019-12-13: 13:00:00 2 mg via INTRAVENOUS
  Administered 2019-12-13: 4 mg via INTRAVENOUS
  Administered 2019-12-13 (×2): 2 mg via INTRAVENOUS
  Administered 2019-12-13: 07:00:00 1 mg via INTRAVENOUS
  Administered 2019-12-14: 4 mg via INTRAVENOUS
  Administered 2019-12-14 (×3): 2 mg via INTRAVENOUS
  Administered 2019-12-14 (×2): 4 mg via INTRAVENOUS
  Administered 2019-12-14: 3 mg via INTRAVENOUS
  Administered 2019-12-15 (×4): 2 mg via INTRAVENOUS
  Filled 2019-12-12: qty 1
  Filled 2019-12-12: qty 2
  Filled 2019-12-12 (×2): qty 1
  Filled 2019-12-12 (×3): qty 2
  Filled 2019-12-12 (×2): qty 1
  Filled 2019-12-12: qty 2
  Filled 2019-12-12 (×2): qty 1
  Filled 2019-12-12: qty 2
  Filled 2019-12-12 (×4): qty 1

## 2019-12-12 MED ORDER — ONDANSETRON HCL 4 MG/2ML IJ SOLN
4.0000 mg | Freq: Once | INTRAMUSCULAR | Status: AC
  Administered 2019-12-12: 4 mg via INTRAVENOUS
  Filled 2019-12-12: qty 2

## 2019-12-12 MED ORDER — LORAZEPAM 2 MG/ML IJ SOLN: 0.0000 mg | Freq: Two times a day (BID) | INTRAMUSCULAR | Status: DC

## 2019-12-12 MED ORDER — SODIUM CHLORIDE 0.9 % IV BOLUS
500.0000 mL | Freq: Once | INTRAVENOUS | Status: AC
  Administered 2019-12-12: 500 mL via INTRAVENOUS

## 2019-12-12 MED ORDER — THIAMINE HCL 100 MG/ML IJ SOLN
Freq: Once | INTRAVENOUS | Status: AC
  Filled 2019-12-12: qty 1000

## 2019-12-12 NOTE — ED Notes (Signed)
Pt assisted with bedside commode by Probation officer and Sharlot Gowda, RN. Pt very addiment about providing peri care for himself and moving around without assist; pt unsteady when standing. Pt needing 1-2x assist to stand and ambulate.  Pt's belongings placed in belongings bags at bedside. Pt has phone, wallet and keys with his clothes.

## 2019-12-12 NOTE — ED Notes (Signed)
Patient attempting to collect urine.

## 2019-12-12 NOTE — H&P (Signed)
History and Physical    Danny Kerr [id number] DOB: 1956/09/05 DOA: 12/12/2019  PCP: Velna Hatchet, MD (Confirm with patient/family/NH records and if not entered, this has to be entered at [facility] point of entry) Patient coming from: Home  I have personally briefly reviewed patient's old medical records in [place]  Chief Complaint: I am shaking with tremors 2.  Nausea and weakness  HPI: Danny Kerr is a 64 y.o. male with medical history significant for chronic alcohol abuse whose last alcohol intake was about 2 days ago.  In the past few days, patient has been generally weak and nauseous. However he noticed increased shaking/tremors of the upper extremities.  There was associated nausea, vomiting and diaphoresis.  He denies any loss of consciousness, seizures, fever, cough, diarrhea, polyuria, polydipsia or hallucinations.  Based on his worsening symptoms, he came to ED for further evaluation.   ED Course: At the ED, he was hemodynamically stable and afebrile.  His ED course was notable for episodes of uncontrolled hypertension and his clinical constellation was consistent with alcohol withdrawal.  He received lorazepam 4 mg IV, Zofran and IV normal saline 500 mls.  Review of Systems: As per HPI otherwise 10 point review of systems negative.   Negative except as highlighted above in HPI.  Past Medical History:  Diagnosis Date  . Arthritis   . ETOH abuse   . Pneumonia   . Rosacea   . Seasonal allergies     Past Surgical History:  Procedure Laterality Date  . COLONOSCOPY    . KNEE ARTHROTOMY  1977   rt knee  . RADIOLOGY WITH ANESTHESIA N/A 09/03/2017   Procedure: MIR CERVICAL Burt Ek;  Surgeon: Radiologist, Medication, MD;  Location: [place];  Service: Radiology;  Laterality: N/A;  . TOTAL KNEE ARTHROPLASTY Right 09/06/2014   Procedure: RIGHT TOTAL KNEE ARTHROPLASTY;  Surgeon: Gearlean Alf, MD;  Location: [facility];  Service: Orthopedics;   Laterality: Right;     reports that he quit smoking about 30 years ago. He has never used smokeless tobacco. He reports current alcohol use. He reports that he does not use drugs.  No Known Allergies  Family History  Problem Relation Age of Onset  . Lung cancer Mother   . Parkinson's disease Sister    Maternal history of lung cancer   Prior to Admission medications   Medication Sig Start Date End Date Taking? Authorizing Provider  Azelaic Acid 15 % cream Apply 1 application topically 2 (two) times daily. 04/26/19  Yes [provider]  doxycycline (ADOXA) 100 MG tablet Take 100 mg by mouth 2 (two) times daily. 12/08/19  Yes [provider]  ergocalciferol (VITAMIN D2) 1.25 MG (50000 UT) capsule Take 50,000 Units by mouth once a week.    Yes [provider]  escitalopram (LEXAPRO) 10 MG tablet Take 10 mg by mouth daily. 08/05/19  Yes [provider]  Multiple Vitamins-Minerals (QC MULTI-VITE 50 & OVER) TABS TAKE ONE TABLET EVERY DAY 08/23/19  Yes [provider]  ondansetron (ZOFRAN ODT) 4 MG disintegrating tablet Take 1 tablet (4 mg total) by mouth every 8 (eight) hours as needed for nausea or vomiting. 08/20/19  Yes Henderly, Britni A, PA-C  thiamine 100 MG tablet TAKE ONE TABLET EVERY DAY 08/23/19  Yes [provider]  traZODone (DESYREL) 50 MG tablet Take 50 mg by mouth at bedtime as needed. 08/23/19  Yes [provider]  baclofen (LIORESAL) 10 MG tablet Take 1 tablet (  10 mg total) by mouth 3 (three) times daily. Patient not taking: Reported on 12/12/2019 09/23/19 09/22/20  Dara Hoyer, PA-C  doxycycline (VIBRA-TABS) 100 MG tablet Take 100 mg by mouth daily. 09/04/19   [provider]    Physical Exam: Vitals:   12/12/19 1630 12/12/19 1700 12/12/19 1830 12/12/19 1930  BP: (!) 181/108 (!) 190/105 (!) 171/94 (!) 154/87  Pulse: 98 89 87 98  Resp:   16 15  Temp:      TempSrc:      SpO2: 97% 100% 98% 97%  Weight:       Height:        Constitutional: He was tremulous, shaking and comfortable Vitals:   12/12/19 1630 12/12/19 1700 12/12/19 1830 12/12/19 1930  BP: (!) 181/108 (!) 190/105 (!) 171/94 (!) 154/87  Pulse: 98 89 87 98  Resp:   16 15  Temp:      TempSrc:      SpO2: 97% 100% 98% 97%  Weight:      Height:       Eyes: PERRL, lids and conjunctivae normal ENMT: Mucous membranes are moist. Posterior pharynx clear of any exudate or lesions.Normal dentition.  Neck: normal, supple, no masses, no thyromegaly Respiratory: clear to auscultation bilaterally, no wheezing, no crackles. Normal respiratory effort. No accessory muscle use.  Cardiovascular: Regular rate and rhythm, no murmurs / rubs / gallops. No extremity edema. 2+ pedal pulses. No carotid bruits.  Abdomen: no tenderness, no masses palpated. No hepatosplenomegaly. Bowel sounds positive.  Musculoskeletal: no clubbing / cyanosis. No joint deformity upper and lower extremities. Good ROM, no contractures. Normal muscle tone.  Skin: no rashes, lesions, ulcers. No induration Neurologic: CN 2-12 grossly intact. Sensation intact, DTR normal. Strength 5/5 in all 4.  Tremors of the upper extremity Psychiatric: Normal judgment and insight. Alert and oriented x 3. Normal mood.   Labs on Admission: I have personally reviewed following labs and imaging studies  CBC: Recent Labs  Lab 12/12/19 1445  WBC 5.5  NEUTROABS 4.8  HGB 15.0  HCT 43.1  MCV 94.1  PLT 96*   Basic Metabolic Panel: Recent Labs  Lab 12/12/19 1445  NA 138  K 3.7  CL 97*  CO2 20*  GLUCOSE 92  BUN 12  CREATININE 0.62  CALCIUM 9.0  MG 1.7   GFR: Estimated Creatinine Clearance: 111.5 mL/min (by C-G formula based on SCr of 0.62 mg/dL). Liver Function Tests: Recent Labs  Lab 12/12/19 1445  AST 101*  ALT 34  ALKPHOS 117  BILITOT 2.0*  PROT 7.6  ALBUMIN 4.4   No results for input(s): LIPASE, AMYLASE in the last 168 hours. No results for input(s): AMMONIA in the  last 168 hours. Coagulation Profile: No results for input(s): INR, PROTIME in the last 168 hours. Cardiac Enzymes: No results for input(s): CKTOTAL, CKMB, CKMBINDEX, TROPONINI in the last 168 hours. BNP (last 3 results) No results for input(s): PROBNP in the last 8760 hours. HbA1C: No results for input(s): HGBA1C in the last 72 hours. CBG: No results for input(s): GLUCAP in the last 168 hours. Lipid Profile: No results for input(s): CHOL, HDL, LDLCALC, TRIG, CHOLHDL, LDLDIRECT in the last 72 hours. Thyroid Function Tests: No results for input(s): TSH, T4TOTAL, FREET4, T3FREE, THYROIDAB in the last 72 hours. Anemia Panel: Recent Labs    12/12/19 1445  VITAMINB12 287   Urine analysis:    Component Value Date/Time   COLORURINE AMBER (A) 08/20/2019 Ava 08/20/2019 1608  LABSPEC 1.024 08/20/2019 1608   PHURINE 5.0 08/20/2019 1608   GLUCOSEU 50 (A) 08/20/2019 1608   HGBUR NEGATIVE 08/20/2019 1608   BILIRUBINUR NEGATIVE 08/20/2019 1608   KETONESUR 80 (A) 08/20/2019 1608   PROTEINUR NEGATIVE 08/20/2019 1608   UROBILINOGEN 0.2 08/26/2014 1425   NITRITE NEGATIVE 08/20/2019 1608   LEUKOCYTESUR NEGATIVE 08/20/2019 1608    Radiological Exams on Admission: No results found.  EKG: Independently reviewed.  Pending  Assessment/Plan Active Problems:   Alcohol withdrawal ([place])   1.  Alcohol withdrawal syndrome: Patient remains at high risk for delirium tremens.  He will be started on IV thiamine, folic acid and multivitamin as well as scheduled lorazepam.  We will also give as needed lorazepam based on his CIWA assessment.  Monitor patient closely.  Continue supportive care.  2.  Elevated blood pressure: Likely secondary to alcohol withdrawal syndrome.  He does not have pre-existing history of hypertension.  Monitor blood pressure profile and treat alcohol withdrawal.  Give as needed clonidine if blood pressure is persistently elevated.  3. GI prophylaxis with  protonix.  4.  Thrombocytopenia: Avoid anticoagulants and monitor platelet level closely    DVT prophylaxis: SCDS Code Status: Full code  Family Communication: Discussed with the spouse Mrs. Nehal Werley [phone]  Disposition Plan: Anticipated discharge to possible rehab  Consults called: None  Admission status: Inpatient telemetry    Phineas Semen MD [facility] Pager [phone]  If 7PM-7AM, please contact night-coverage [email or web address] Password [facility]  12/12/2019, 7:52 PM

## 2019-12-12 NOTE — ED Notes (Signed)
Patient assisted to bedside commode.

## 2019-12-12 NOTE — ED Notes (Addendum)
Patient unable to urinate at this time. Will continue to monitor.

## 2019-12-12 NOTE — ED Provider Notes (Signed)
[facility] DEPT Provider Note   CSN: [id number] Arrival date & time: 12/12/19  1344     History Chief Complaint  Patient presents with  . Alcohol Intoxication    Danny Kerr is a 64 y.o. male.  HPI Patient presents with tremors and difficulty walking.  States began a couple days ago.  Also has not had a drink in a couple days.  Drinks about 1/5 of vodka a day.  States that has been 2 days without a drink.  States he was driving a few hours ago but now cannot even walk.  States he feels shaky all over.  States his wife is a Marine scientist and thinks it could be in DTs.  Had previously been at [employer] and was doing well but is been drinking more heavily over the last couple months.  No headache.  No confusion.    Past Medical History:  Diagnosis Date  . Arthritis   . ETOH abuse   . Pneumonia   . Rosacea   . Seasonal allergies     Patient Active Problem List   Diagnosis Date Noted  . Alcohol use disorder, severe, in early remission, in controlled environment ([place]) 10/03/2019  . 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 COLONOSCOPY    . KNEE ARTHROTOMY  1977   rt knee  . RADIOLOGY WITH ANESTHESIA N/A 09/03/2017   Procedure: MIR CERVICAL Burt Ek;  Surgeon: Radiologist, Medication, MD;  Location: [place];  Service: Radiology;  Laterality: N/A;  . TOTAL KNEE ARTHROPLASTY Right 09/06/2014   Procedure: RIGHT TOTAL KNEE ARTHROPLASTY;  Surgeon: Gearlean Alf, MD;  Location[facility];  Service: Orthopedics;  Laterality: Right;       Family History  Problem Relation Age of Onset  . Lung cancer Mother   . Parkinson's disease Sister     Social History   Tobacco Use  . Smoking status: Former Smoker    Quit date: 08/26/1989    Years since quitting: 30.3  . Smokeless tobacco: Never Used  Substance Use Topics  . Alcohol use: Yes    Comment: heavy daily alcohol use  . Drug use:  No    Home Medications Prior to Admission medications   Medication Sig Start Date End Date Taking? Authorizing Provider  Azelaic Acid 15 % cream Apply 1 application topically 2 (two) times daily. 04/26/19  Yes [provider]  doxycycline (ADOXA) 100 MG tablet Take 100 mg by mouth 2 (two) times daily. 12/08/19  Yes [provider]  ergocalciferol (VITAMIN D2) 1.25 MG (50000 UT) capsule Take 50,000 Units by mouth once a week.    Yes [provider]  escitalopram (LEXAPRO) 10 MG tablet Take 10 mg by mouth daily. 08/05/19  Yes [provider]  Multiple Vitamins-Minerals (QC MULTI-VITE 50 & OVER) TABS TAKE ONE TABLET EVERY DAY 08/23/19  Yes [provider]  ondansetron (ZOFRAN ODT) 4 MG disintegrating tablet Take 1 tablet (4 mg total) by mouth every 8 (eight) hours as needed for nausea or vomiting. 08/20/19  Yes Henderly, Britni A, PA-C  thiamine 100 MG tablet TAKE ONE TABLET EVERY DAY 08/23/19  Yes [provider]  traZODone (DESYREL) 50 MG tablet Take 50 mg by mouth at bedtime as needed. 08/23/19  Yes [provider]  baclofen (LIORESAL) 10 MG tablet Take 1 tablet (10 mg total) by mouth 3 (three) times daily. Patient not taking: Reported on 12/12/2019  09/23/19 09/22/20  Dara Hoyer, PA-C  doxycycline (VIBRA-TABS) 100 MG tablet Take 100 mg by mouth daily. 09/04/19   [provider]    Allergies    Patient has no known allergies.  Review of Systems   Review of Systems  Constitutional: Negative for appetite change.  HENT: Negative for congestion.   Eyes: Negative for visual disturbance.  Respiratory: Negative for shortness of breath.   Cardiovascular: Negative for chest pain.  Gastrointestinal: Negative for abdominal pain.  Genitourinary: Negative for flank pain.  Musculoskeletal: Negative for back pain.  Skin: Negative for rash.  Neurological: Positive for tremors.  Psychiatric/Behavioral: Negative for hallucinations.  The patient is nervous/anxious.     Physical Exam Updated Vital Signs BP (!) 171/94 (BP Location: Right Arm)   Pulse 87   Temp 98.5 F (36.9 C) (Oral)   Resp 16   Ht 6\' 3"  (1.905 m)   Wt 99.8 kg   SpO2 98%   BMI 27.50 kg/m   Physical Exam Vitals and nursing note reviewed.  HENT:     Head: Atraumatic.     Nose:     Comments: Bulbous nose.  Rhinophyma Eyes:     Pupils: Pupils are equal, round, and reactive to light.  Cardiovascular:     Rate and Rhythm: Tachycardia present.  Pulmonary:     Breath sounds: No wheezing, rhonchi or rales.  Abdominal:     Tenderness: There is no abdominal tenderness.  Skin:    Capillary Refill: Capillary refill takes less than 2 seconds.     Coloration: Skin is not pale.  Neurological:     Mental Status: He is alert and oriented to person, place, and time.     Comments: Anxious tremulous.  Shaking bilateral upper extremities.  Able to do finger-nose testing but somewhat limited by tremors.  Psychiatric:     Comments: Patient is anxious.     ED Results / Procedures / Treatments   Labs (all labs ordered are listed, but only abnormal results are displayed) Labs Reviewed  COMPREHENSIVE METABOLIC PANEL - Abnormal; Notable for the following components:      Result Value   Chloride 97 (*)    CO2 20 (*)    AST 101 (*)    Total Bilirubin 2.0 (*)    Anion gap 21 (*)    All other components within normal limits  CBC WITH DIFFERENTIAL/PLATELET - Abnormal; Notable for the following components:   Platelets 96 (*)    Lymphs Abs 0.2 (*)    All other components within normal limits  ETHANOL - Abnormal; Notable for the following components:   Alcohol, Ethyl (B) 49 (*)    All other components within normal limits  VITAMIN B12  MAGNESIUM  RAPID URINE DRUG SCREEN, HOSP PERFORMED    EKG None  Radiology No results found.  Procedures Procedures (including critical care time)  Medications Ordered in ED Medications  LORazepam (ATIVAN)  injection 2 mg (2 mg Intravenous Given 12/12/19 1436)  sodium chloride 0.9 % bolus 500 mL (0 mLs Intravenous Stopped 12/12/19 1746)  ondansetron (ZOFRAN) injection 4 mg (4 mg Intravenous Given 12/12/19 1756)  LORazepam (ATIVAN) injection 2 mg (2 mg Intravenous Given 12/12/19 1756)    ED Course  I have reviewed the triage vital signs and the nursing notes.  Pertinent labs & imaging results that were available during my care of the patient were reviewed by me and considered in my medical decision making (see chart for details).  MDM Rules/Calculators/A&P                      Patient presents with likely alcohol withdrawal.  Heavy drinker and drinks around fifth a day.  He last drank 2 days ago but alcohol level is 59.  However remains very tremulous.  States he was unable to walk due to nauseousness and weakness.  Has had Ativan IV twice continued tremulousness although he feels somewhat improved.  Still states he is nauseous and unsure if he is able to eat and drink.  I think with this patient benefit admission the hospital since I am not sure he will be able to tolerate oral Librium.  Will discuss with hospitalist. Final Clinical Impression(s) / ED Diagnoses Final diagnoses:  Alcohol withdrawal syndrome without complication [facility])    Rx / DC Orders ED Discharge Orders    None       Davonna Belling, MD 12/12/19 1919

## 2019-12-12 NOTE — ED Triage Notes (Addendum)
Per ems, patient comes from  Home, w/d from etoh, last drink two days ago. 18G  Left ac by Ems, patient vomiting en route, given 4mg  zofran by EMS. Patient denies pain. States he was out running errands this morning and now he is nauseated and cannot walk.

## 2019-12-13 DIAGNOSIS — F10231 Alcohol dependence with withdrawal delirium: Principal | ICD-10-CM

## 2019-12-13 LAB — CBC
HCT: 42.3 % (ref 39.0–52.0)
Hemoglobin: 14.5 g/dL (ref 13.0–17.0)
MCH: 32.5 pg (ref 26.0–34.0)
MCHC: 34.3 g/dL (ref 30.0–36.0)
MCV: 94.8 fL (ref 80.0–100.0)
Platelets: 80 10*3/uL — ABNORMAL LOW (ref 150–400)
RBC: 4.46 MIL/uL (ref 4.22–5.81)
RDW: 15.1 % (ref 11.5–15.5)
WBC: 4.5 10*3/uL (ref 4.0–10.5)
nRBC: 0 % (ref 0.0–0.2)

## 2019-12-13 LAB — PHOSPHORUS: Phosphorus: 3.1 mg/dL (ref 2.5–4.6)

## 2019-12-13 LAB — HIV ANTIBODY (ROUTINE TESTING W REFLEX): HIV Screen 4th Generation wRfx: NONREACTIVE — AB

## 2019-12-13 LAB — MAGNESIUM: Magnesium: 1.7 mg/dL (ref 1.7–2.4)

## 2019-12-13 LAB — TSH: TSH: 2.358 u[IU]/mL (ref 0.350–4.500)

## 2019-12-13 LAB — SARS CORONAVIRUS 2 (TAT 6-24 HRS): SARS Coronavirus 2: NEGATIVE

## 2019-12-13 LAB — VITAMIN B12: Vitamin B-12: 339 pg/mL (ref 180–914)

## 2019-12-13 MED ORDER — MAGNESIUM SULFATE 2 GM/50ML IV SOLN
2.0000 g | Freq: Once | INTRAVENOUS | Status: AC
  Administered 2019-12-13: 2 g via INTRAVENOUS
  Filled 2019-12-13: qty 50

## 2019-12-13 MED ORDER — HALOPERIDOL LACTATE 5 MG/ML IJ SOLN
5.0000 mg | Freq: Once | INTRAMUSCULAR | Status: AC
  Administered 2019-12-13: 5 mg via INTRAVENOUS

## 2019-12-13 MED ORDER — PANTOPRAZOLE SODIUM 40 MG PO TBEC: 40.0000 mg | DELAYED_RELEASE_TABLET | Freq: Every day | ORAL | Status: DC

## 2019-12-13 MED ORDER — HALOPERIDOL LACTATE 5 MG/ML IJ SOLN
INTRAMUSCULAR | Status: AC
  Filled 2019-12-13: qty 1

## 2019-12-13 MED ORDER — POTASSIUM CHLORIDE CRYS ER 20 MEQ PO TBCR
20.0000 meq | EXTENDED_RELEASE_TABLET | Freq: Two times a day (BID) | ORAL | Status: DC
  Administered 2019-12-13 (×2): 20 meq via ORAL
  Filled 2019-12-13 (×2): qty 1

## 2019-12-13 MED ORDER — PANTOPRAZOLE SODIUM 40 MG PO TBEC
40.0000 mg | DELAYED_RELEASE_TABLET | Freq: Two times a day (BID) | ORAL | Status: DC
  Administered 2019-12-13 – 2019-12-19 (×12): 40 mg via ORAL
  Filled 2019-12-13 (×14): qty 1

## 2019-12-13 NOTE — Progress Notes (Signed)
PROGRESS NOTE    Danny Kerr  [id number] DOB: 1956-03-20 DOA: 12/12/2019 PCP: Velna Hatchet, MD    Brief Narrative:  64 yo male with h/o alcoholism, last drink 2 days ago, drinks about 1/5 liquor a day. Last rehab 07/2019 at [facility] for one month and resorted back drinking.  His wife divorced with him that month.  Came back home and started living air BnB.  AA meetings are closed onsite.  Patient states he was driving until 2 weeks ago.  He called EMS because he could not get up and walk yesterday.  In the emergency room, patient was hemodynamically stable and afebrile.  Had uncontrolled blood pressure, alcohol level 49, shaky and tremulous and unsteady gait.  Admitted with benzodiazepine protocol.  Assessment & Plan:   Active Problems:   Alcohol withdrawal ([place])  Alcoholism with alcohol related disorder, alcohol withdrawal with risk of DTs: Agree with continuing monitoring in the hospital due to very high risk of alcohol related disorder. Fall precautions, delirium precautions. Maintenance IV fluids, multivitamins.  Seizure precautions. High-dose benzodiazepine, scheduled and as needed doses of Ativan with CIWA scale. Clonidine protocol for high blood pressure. Close monitoring of electrolytes and replacements. Trazodone at night. He has used Lexapro in the past, resumed.  Unsteady gait/neuropathy: Probably related to chronic alcoholism.  Rule out other organic causes.  Check magnesium and phosphorus and replace.  Check 123456, folic acid, TSH, [id number] levels.  No evidence of bacterial infection.  Work with PT OT.  Hypomagnesemia: Replace and monitor.   Contacted by patient's ex-wife, Ms. Nazeer Tinker who is patient's next of kin and contact person on the medical records.  Discussed in details about ongoing issues with unsteady gait, ongoing use of alcohol and how to help him on the long-term. We discussed that we need to get him through this withdrawal treatment  process and ultimately will work with Education officer, museum to see what can be done to help him in the long run.  DVT prophylaxis: SCDs Code Status: Full code Family Communication: Patient's ex-wife who is his next of kin. Disposition Plan: patient is from home. Anticipated DC to unknown, Barriers to discharge active treatment for alcohol withdrawal syndrome.   Consultants:   None  Procedures:   None  Antimicrobials:   None   Subjective: Patient seen and examined.  No overnight events.  Patient himself was slightly impulsive and trying to get out of the bed.  Patient was alert and oriented, however he was also stating that he is seeing someone sitting next to him.  Objective: Vitals:   12/12/19 2112 12/12/19 2119 12/12/19 2342 12/13/19 0540  BP:  (!) 148/95 (!) 155/96 (!) 152/98  Pulse:  96 85 84  Resp:  20 20 20   Temp:  98.4 F (36.9 C) 98.4 F (36.9 C) 98 F (36.7 C)  TempSrc:  Oral Oral Oral  SpO2:  98% 97% 99%  Weight: 88.1 kg     Height: 6\' 3"  (1.905 m)      No intake or output data in the 24 hours ending 12/13/19 1006 Filed Weights   12/12/19 1358 12/12/19 2112  Weight: 99.8 kg 88.1 kg    Examination:  General exam: Appears anxious, shaky and tremulous, chronically sick looking. Respiratory system: Clear to auscultation. Respiratory effort normal.  On room air. Cardiovascular system: S1 & S2 heard, RRR. No JVD, murmurs, rubs, gallops or clicks. No pedal edema. Gastrointestinal system: Abdomen is nondistended, soft and nontender. No organomegaly or masses felt.  Normal bowel sounds heard. Central nervous system: Alert and oriented x4.  Generalized weakness. Extremities: Symmetric 5 x 5 power.  Can move all extremities equally. Skin: No rashes, lesions or ulcers Psychiatry: Judgement and insight appear normal. Mood & affect flat, anxious, impulsive at times.    Data Reviewed: I have personally reviewed following labs and imaging studies  CBC: Recent Labs  Lab  12/12/19 1445 12/13/19 0537  WBC 5.5 4.5  NEUTROABS 4.8  --   HGB 15.0 14.5  HCT 43.1 42.3  MCV 94.1 94.8  PLT 96* 80*   Basic Metabolic Panel: Recent Labs  Lab 12/12/19 1445 12/13/19 0537  NA 138  --   K 3.7  --   CL 97*  --   CO2 20*  --   GLUCOSE 92  --   BUN 12  --   CREATININE 0.62  --   CALCIUM 9.0  --   MG 1.7 1.7  PHOS  --  3.1   GFR: Estimated Creatinine Clearance: 111.5 mL/min (by C-G formula based on SCr of 0.62 mg/dL). Liver Function Tests: Recent Labs  Lab 12/12/19 1445  AST 101*  ALT 34  ALKPHOS 117  BILITOT 2.0*  PROT 7.6  ALBUMIN 4.4   No results for input(s): LIPASE, AMYLASE in the last 168 hours. No results for input(s): AMMONIA in the last 168 hours. Coagulation Profile: No results for input(s): INR, PROTIME in the last 168 hours. Cardiac Enzymes: No results for input(s): CKTOTAL, CKMB, CKMBINDEX, TROPONINI in the last 168 hours. BNP (last 3 results) No results for input(s): PROBNP in the last 8760 hours. HbA1C: No results for input(s): HGBA1C in the last 72 hours. CBG: No results for input(s): GLUCAP in the last 168 hours. Lipid Profile: No results for input(s): CHOL, HDL, LDLCALC, TRIG, CHOLHDL, LDLDIRECT in the last 72 hours. Thyroid Function Tests: No results for input(s): TSH, T4TOTAL, FREET4, T3FREE, THYROIDAB in the last 72 hours. Anemia Panel: Recent Labs    12/12/19 1445  VITAMINB12 287   Sepsis Labs: No results for input(s): PROCALCITON, LATICACIDVEN in the last 168 hours.  Recent Results (from the past 240 hour(s))  SARS CORONAVIRUS 2 (TAT 6-24 HRS) Nasopharyngeal Nasopharyngeal Swab     Status: None   Collection Time: 12/12/19  7:38 PM   Specimen: Nasopharyngeal Swab  Result Value Ref Range Status   SARS Coronavirus 2 NEGATIVE NEGATIVE Final    Comment: (NOTE) SARS-CoV-2 target nucleic acids are NOT DETECTED. The SARS-CoV-2 RNA is generally detectable in upper and lower respiratory specimens during the acute phase  of infection. Negative results do not preclude SARS-CoV-2 infection, do not rule out co-infections with other pathogens, and should not be used as the sole basis for treatment or other patient management decisions. Negative results must be combined with clinical observations, patient history, and epidemiological information. The expected result is Negative. Fact Sheet for Patients: SugarRoll.be Fact Sheet for Healthcare Providers: https://www.woods-mathews.com/ This test is not yet approved or cleared by the [place] FDA and  has been authorized for detection and/or diagnosis of SARS-CoV-2 by FDA under an Emergency Use Authorization (EUA). This EUA will remain  in effect (meaning this test can be used) for the duration of the COVID-19 declaration under Section 56 4(b)(1) of the Act, 21 U.S.C. section 360bbb-3(b)(1), unless the authorization is terminated or revoked sooner. Performed at [facility], [place] [address], [place], [place] [address]          Radiology Studies: No results found.  Scheduled Meds: . escitalopram  10 mg Oral Daily  . folic acid  1 mg Oral Daily  . LORazepam  0-4 mg Intravenous Q6H   Followed by  . [START ON 12/14/2019] LORazepam  0-4 mg Intravenous Q12H  . multivitamin with minerals  1 tablet Oral Daily  . pantoprazole  40 mg Oral BID  . potassium chloride  20 mEq Oral BID  . thiamine  100 mg Oral Daily   Or  . thiamine  100 mg Intravenous Daily   Continuous Infusions: . magnesium sulfate bolus IVPB       LOS: 1 day    Time spent: 35 minutes    Barb Merino, MD [facility] Pager [phone]

## 2019-12-13 NOTE — Progress Notes (Signed)
Have alerted MD to pt continuing to get OOB and hallucinate, even after 10 mg of IV Ativan today. Will await any new instruction.

## 2019-12-14 LAB — CBC WITH DIFFERENTIAL/PLATELET
Abs Immature Granulocytes: 0.02 10*3/uL (ref 0.00–0.07)
Basophils Absolute: 0 10*3/uL (ref 0.0–0.1)
Basophils Relative: 1 %
Eosinophils Absolute: 0.1 10*3/uL (ref 0.0–0.5)
Eosinophils Relative: 2 %
HCT: 42.2 % (ref 39.0–52.0)
Hemoglobin: 14.5 g/dL (ref 13.0–17.0)
Immature Granulocytes: 1 %
Lymphocytes Relative: 18 %
Lymphs Abs: 0.7 10*3/uL (ref 0.7–4.0)
MCH: 32.5 pg (ref 26.0–34.0)
MCHC: 34.4 g/dL (ref 30.0–36.0)
MCV: 94.6 fL (ref 80.0–100.0)
Monocytes Absolute: 0.6 10*3/uL (ref 0.1–1.0)
Monocytes Relative: 16 %
Neutro Abs: 2.4 10*3/uL (ref 1.7–7.7)
Neutrophils Relative %: 62 %
Platelets: 71 10*3/uL — ABNORMAL LOW (ref 150–400)
RBC: 4.46 MIL/uL (ref 4.22–5.81)
RDW: 14.8 % (ref 11.5–15.5)
WBC: 3.8 10*3/uL — ABNORMAL LOW (ref 4.0–10.5)
nRBC: 0 % (ref 0.0–0.2)

## 2019-12-14 LAB — COMPREHENSIVE METABOLIC PANEL
ALT: 23 U/L (ref 0–44)
AST: 51 U/L — ABNORMAL HIGH (ref 15–41)
Albumin: 3.7 g/dL (ref 3.5–5.0)
Alkaline Phosphatase: 93 U/L (ref 38–126)
Anion gap: 10 (ref 5–15)
BUN: 7 mg/dL — ABNORMAL LOW (ref 8–23)
CO2: 24 mmol/L (ref 22–32)
Calcium: 9 mg/dL (ref 8.9–10.3)
Chloride: 101 mmol/L (ref 98–111)
Creatinine, Ser: 0.55 mg/dL — ABNORMAL LOW (ref 0.61–1.24)
GFR calc Af Amer: >60 mL/min (ref >60)
GFR calc non Af Amer: >60 mL/min (ref >60)
Glucose, Bld: 105 mg/dL — ABNORMAL HIGH (ref 70–99)
Potassium: 3.3 mmol/L — ABNORMAL LOW (ref 3.5–5.1)
Sodium: 135 mmol/L (ref 135–145)
Total Bilirubin: 2.1 mg/dL — ABNORMAL HIGH (ref 0.3–1.2)
Total Protein: 6.5 g/dL (ref 6.5–8.1)

## 2019-12-14 LAB — PROTIME-INR
INR: 1 (ref 0.8–1.2)
Prothrombin Time: 13.2 seconds (ref 11.4–15.2)

## 2019-12-14 LAB — MAGNESIUM: Magnesium: 1.8 mg/dL (ref 1.7–2.4)

## 2019-12-14 LAB — PHOSPHORUS: Phosphorus: 3.6 mg/dL (ref 2.5–4.6)

## 2019-12-14 MED ORDER — HALOPERIDOL LACTATE 5 MG/ML IJ SOLN: 5.0000 mg | Freq: Once | INTRAMUSCULAR | Status: DC

## 2019-12-14 MED ORDER — POTASSIUM CHLORIDE CRYS ER 20 MEQ PO TBCR
40.0000 meq | EXTENDED_RELEASE_TABLET | Freq: Two times a day (BID) | ORAL | Status: DC
  Administered 2019-12-14 – 2019-12-15 (×3): 40 meq via ORAL
  Filled 2019-12-14 (×4): qty 2

## 2019-12-14 MED ORDER — MAGNESIUM SULFATE 2 GM/50ML IV SOLN
2.0000 g | Freq: Once | INTRAVENOUS | Status: AC
  Administered 2019-12-14: 2 g via INTRAVENOUS
  Filled 2019-12-14: qty 50

## 2019-12-14 MED ORDER — ENSURE ENLIVE PO LIQD: 237.0000 mL | Freq: Two times a day (BID) | ORAL | Status: DC

## 2019-12-14 NOTE — Progress Notes (Signed)
OT Cancellation Note  Patient Details Name: DREAN REMME MRN: [id number] DOB: [birth year]   Cancelled Treatment:    Reason Eval/Treat Not Completed: Other (comment)  Medical issues which prohibited therapy  RN reports pt hallucinating and given more Ativan.  Will check back as schedule permits.   Lin Landsman Mickel Baas, OT Acute Rehabilitation Services [phone]    12/14/2019, 12:02 PM

## 2019-12-14 NOTE — Progress Notes (Signed)
PROGRESS NOTE    Danny Kerr  [id number] DOB: 03/03/1956 DOA: 12/12/2019 PCP: Velna Hatchet, MD    Brief Narrative:  64 yo male with h/o alcoholism, last drink 2 days prior to admission , drinks about 1/5 Vodka a day. Last rehab 07/2019 at [facility] for one month and resorted back drinking.  His wife divorced with him that month.  Came back home and started living air-BnB.  AA meetings are closed onsite.  Patient states he was driving until 2 weeks ago.  He called EMS because he could not get up and walk.  In the emergency room, patient was hemodynamically stable and afebrile.  Had uncontrolled blood pressure, alcohol level 49, shaky and tremulous and unsteady gait.  Admitted with benzodiazepine protocol.  Assessment & Plan:   Active Problems:   Alcohol withdrawal ([place])  Alcoholism with alcohol related disorder, alcohol withdrawal with risk of DTs: Will need continuing monitoring in the hospital due to very high risk of alcohol related disorder. Fall precautions, delirium precautions.  Continue with Air cabin crew. Maintenance IV fluids, multivitamins.  Seizure precautions. High-dose benzodiazepine, scheduled and as needed doses of Ativan with CIWA scale. Clonidine protocol for high blood pressure. Close monitoring of electrolytes and replacements. Trazodone at night. He has used Lexapro in the past, resumed.  Unsteady gait/neuropathy: Probably related to chronic alcoholism.  Rule out other organic causes.   Magnesium and phosphorus checked and replaced.   B12 normal.  TSH normal.  Folic acid and D2 levels pending.   no evidence of bacterial infection.  Work with PT OT.  May need physical rehab along with alcohol rehab once improves with withdrawals.  Hypomagnesemia: Replace further and monitor.   2/21: Contacted by patient's ex-wife, Ms. Danny Kerr who is patient's next of kin and contact person on the medical records.  Discussed in details about ongoing  issues with unsteady gait, ongoing use of alcohol and how to help him on the long-term. We discussed that we need to get him through this withdrawal treatment process and ultimately will work with Education officer, museum to see what can be done to help him in the long run.  DVT prophylaxis: SCDs Code Status: Full code Family Communication: None today. Disposition Plan: patient is from home. Anticipated DC to unknown, Barriers to discharge active treatment for alcohol withdrawal syndrome.   Consultants:   None  Procedures:   None  Antimicrobials:   None   Subjective: Patient seen and examined.  Impulsive and was trying to get out of bed.  He himself denies any complaints.  Patient states he is trying to be comfortable.  Still has some hallucinations, but he can tell that , those are hallucinations.    Objective: Vitals:   12/13/19 2030 12/13/19 2322 12/14/19 0201 12/14/19 0815  BP: (!) 145/98 132/90 (!) 141/99 (!) 141/95  Pulse: 86 90 82 97  Resp: 19 19 17 15   Temp: 98.4 F (36.9 C) 98.7 F (37.1 C) 98.8 F (37.1 C) 98.6 F (37 C)  TempSrc: Oral Oral Oral Oral  SpO2: 96% 98% 97% 97%  Weight:      Height:        Intake/Output Summary (Last 24 hours) at 12/14/2019 1000 Last data filed at 12/13/2019 2159 Gross per 24 hour  Intake --  Output 500 ml  Net -500 ml   Filed Weights   12/12/19 1358 12/12/19 2112  Weight: 99.8 kg 88.1 kg    Examination:  Physical Exam  Constitutional: He is oriented  to person, place, and time and well-developed, well-nourished, and in no distress. No distress.  Anxious, tremulous, shaky and blushed  HENT:  Head: Normocephalic.  Eyes: Pupils are equal, round, and reactive to light.  Cardiovascular: Normal rate and regular rhythm.  Pulmonary/Chest: Breath sounds normal.  Musculoskeletal:        General: No deformity or edema.     Cervical back: Neck supple.  Neurological: He is alert and oriented to person, place, and time.  Psychiatric:   Labile mood.  Anxious.      Data Reviewed: I have personally reviewed following labs and imaging studies  CBC: Recent Labs  Lab 12/12/19 1445 12/13/19 0537 12/14/19 0509  WBC 5.5 4.5 3.8*  NEUTROABS 4.8  --  2.4  HGB 15.0 14.5 14.5  HCT 43.1 42.3 42.2  MCV 94.1 94.8 94.6  PLT 96* 80* 71*   Basic Metabolic Panel: Recent Labs  Lab 12/12/19 1445 12/13/19 0537 12/14/19 0509  NA 138  --  135  K 3.7  --  3.3*  CL 97*  --  101  CO2 20*  --  24  GLUCOSE 92  --  105*  BUN 12  --  7*  CREATININE 0.62  --  0.55*  CALCIUM 9.0  --  9.0  MG 1.7 1.7 1.8  PHOS  --  3.1 3.6   GFR: Estimated Creatinine Clearance: 111.5 mL/min (A) (by C-G formula based on SCr of 0.55 mg/dL (L)). Liver Function Tests: Recent Labs  Lab 12/12/19 1445 12/14/19 0509  AST 101* 51*  ALT 34 23  ALKPHOS 117 93  BILITOT 2.0* 2.1*  PROT 7.6 6.5  ALBUMIN 4.4 3.7   No results for input(s): LIPASE, AMYLASE in the last 168 hours. No results for input(s): AMMONIA in the last 168 hours. Coagulation Profile: Recent Labs  Lab 12/14/19 0509  INR 1.0   Cardiac Enzymes: No results for input(s): CKTOTAL, CKMB, CKMBINDEX, TROPONINI in the last 168 hours. BNP (last 3 results) No results for input(s): PROBNP in the last 8760 hours. HbA1C: No results for input(s): HGBA1C in the last 72 hours. CBG: No results for input(s): GLUCAP in the last 168 hours. Lipid Profile: No results for input(s): CHOL, HDL, LDLCALC, TRIG, CHOLHDL, LDLDIRECT in the last 72 hours. Thyroid Function Tests: Recent Labs    12/13/19 1148  TSH 2.358   Anemia Panel: Recent Labs    12/12/19 1445 12/13/19 1148  VITAMINB12 287 339   Sepsis Labs: No results for input(s): PROCALCITON, LATICACIDVEN in the last 168 hours.  Recent Results (from the past 240 hour(s))  SARS CORONAVIRUS 2 (TAT 6-24 HRS) Nasopharyngeal Nasopharyngeal Swab     Status: None   Collection Time: 12/12/19  7:38 PM   Specimen: Nasopharyngeal Swab  Result  Value Ref Range Status   SARS Coronavirus 2 NEGATIVE NEGATIVE Final    Comment: (NOTE) SARS-CoV-2 target nucleic acids are NOT DETECTED. The SARS-CoV-2 RNA is generally detectable in upper and lower respiratory specimens during the acute phase of infection. Negative results do not preclude SARS-CoV-2 infection, do not rule out co-infections with other pathogens, and should not be used as the sole basis for treatment or other patient management decisions. Negative results must be combined with clinical observations, patient history, and epidemiological information. The expected result is Negative. Fact Sheet for Patients: SugarRoll.be Fact Sheet for Healthcare Providers: https://www.woods-mathews.com/ This test is not yet approved or cleared by the [place] FDA and  has been authorized for detection and/or diagnosis of  SARS-CoV-2 by FDA under an Emergency Use Authorization (EUA). This EUA will remain  in effect (meaning this test can be used) for the duration of the COVID-19 declaration under Section 56 4(b)(1) of the Act, 21 U.S.C. section 360bbb-3(b)(1), unless the authorization is terminated or revoked sooner. Performed at [facility], [place] [address], [place], [place] [address]          Radiology Studies: No results found.      Scheduled Meds: . escitalopram  10 mg Oral Daily  . folic acid  1 mg Oral Daily  . LORazepam  0-4 mg Intravenous Q6H   Followed by  . LORazepam  0-4 mg Intravenous Q12H  . multivitamin with minerals  1 tablet Oral Daily  . pantoprazole  40 mg Oral BID  . potassium chloride  40 mEq Oral BID  . thiamine  100 mg Oral Daily   Or  . thiamine  100 mg Intravenous Daily   Continuous Infusions: . magnesium sulfate bolus IVPB       LOS: 2 days    Time spent: 30 minutes    Barb Merino, MD [facility] Pager [phone]

## 2019-12-14 NOTE — Progress Notes (Signed)
Initial Nutrition Assessment  DOCUMENTATION CODES:   Not applicable  INTERVENTION:  Ensure Enlive po BID, each supplement provides 350 kcal and 20 grams of protein  Continue to monitor refeeding labs Continue MVI with minerals daily   NUTRITION DIAGNOSIS:   Predicted suboptimal nutrient intake related to acute illness, social / environmental circumstances(Chronic alcohol abuse, admitted for alcohol withdrawl syndrome) as evidenced by estimated needs.   GOAL:   Patient will meet greater than or equal to 90% of their needs    MONITOR:   PO intake, Supplement acceptance, Labs, Weight trends, I & O's  REASON FOR ASSESSMENT:   Malnutrition Screening Tool    ASSESSMENT:  64 year old male with past medical history of arthritis, rosacea, and chronic alcohol abuse presented with increased shaking/tremors of upper extremities associated with nausea, vomiting, weakness, and diaphoresis.  Patient admitted on 2/20 for alcohol withdrawal syndrome.   Patient somnolent at RD visit this afternoon, sitter in room. Sitter reports that patient ate a good breakfast this morning, recalls yogurt, english muffin, sausage, and 2 containers of juice. Sitter stated that patient is able to feed himself with occasional assistance secondary to tremors. She reports that patient has been asleep for the past few hours and had not had lunch today. Per notes, patient with ongoing hallucinations, given Ativan this afternoon.   Unable to obtain nutrition history at this time, per notes he reports drinking about 1/5 liquor daily. Last rehab in October 2020 for a month, and began drinking again. Suspect patient with poor nutritional intake given reported daily alcohol consumption and is at high risk for refeeding. Noted low potassium; Mg and P within normal limits, will continue to monitor. Patient on a regular diet, documented with 90% intake x 1 recorded meal this admission. Will continue to monitor for meal  intake and provide Ensure nutrition supplement to aid with estimated needs.   Current wt 88.1 kg (193.82 lbs) No recent weight history available for review. Last wt prior to admission 100.7 kg (221.54 lbs) in November 2018  Medications reviewed and include: Lexapro, Folic acid, Ativan, MVI, Protonix, Klor-con  Labs: BG 105,92, K 3.3 (L), BUN 7 (L), Cr 0.55 (L), WBC 3.8 (L) Mg/P - WNL   NUTRITION - FOCUSED PHYSICAL EXAM: Deferred    Diet Order:   Diet Order            Diet regular Room service appropriate? Yes; Fluid consistency: Thin  Diet effective now              EDUCATION NEEDS:   No education needs have been identified at this time  Skin:  Skin Assessment: Reviewed RN Assessment  Last BM:  2/22 type 7  Height:   Ht Readings from Last 1 Encounters:  12/12/19 6\' 3"  (1.905 m)    Weight:   Wt Readings from Last 1 Encounters:  12/12/19 88.1 kg    Ideal Body Weight:  89 kg  BMI:  Body mass index is 24.28 kg/m.  Estimated Nutritional Needs:   Kcal:  2100-2300  Protein:  105-115  Fluid:  >/= 2.1 L/day   Lajuan Lines, RD, LDN Clinical Nutrition Office [phone] After Hours/Weekend Pager # in [facility]

## 2019-12-14 NOTE — Progress Notes (Signed)
PT Cancellation Note  Patient Details Name: Danny Kerr MRN: [id number] DOB: 12-25-1955   Cancelled Treatment:    Reason Eval/Treat Not Completed: Medical issues which prohibited therapy  RN reports pt hallucinating and given more Ativan.  Will check back as schedule permits.   Evamarie Raetz,KATHrine E 12/14/2019, 11:16 AM Arlyce Dice, DPT Acute Rehabilitation Services Office: [phone]

## 2019-12-15 LAB — CBC WITH DIFFERENTIAL/PLATELET
Abs Immature Granulocytes: 0.01 10*3/uL (ref 0.00–0.07)
Basophils Absolute: 0.1 10*3/uL (ref 0.0–0.1)
Basophils Relative: 1 %
Eosinophils Absolute: 0.1 10*3/uL (ref 0.0–0.5)
Eosinophils Relative: 2 %
HCT: 44 % (ref 39.0–52.0)
Hemoglobin: 14.9 g/dL (ref 13.0–17.0)
Immature Granulocytes: 0 %
Lymphocytes Relative: 17 %
Lymphs Abs: 0.9 10*3/uL (ref 0.7–4.0)
MCH: 32.3 pg (ref 26.0–34.0)
MCHC: 33.9 g/dL (ref 30.0–36.0)
MCV: 95.4 fL (ref 80.0–100.0)
Monocytes Absolute: 0.7 10*3/uL (ref 0.1–1.0)
Monocytes Relative: 14 %
Neutro Abs: 3.6 10*3/uL (ref 1.7–7.7)
Neutrophils Relative %: 66 %
Platelets: 82 10*3/uL — ABNORMAL LOW (ref 150–400)
RBC: 4.61 MIL/uL (ref 4.22–5.81)
RDW: 14.6 % (ref 11.5–15.5)
WBC: 5.3 10*3/uL (ref 4.0–10.5)
nRBC: 0 % (ref 0.0–0.2)

## 2019-12-15 LAB — COMPREHENSIVE METABOLIC PANEL
ALT: 27 U/L (ref 0–44)
AST: 60 U/L — ABNORMAL HIGH (ref 15–41)
Albumin: 3.9 g/dL (ref 3.5–5.0)
Alkaline Phosphatase: 93 U/L (ref 38–126)
Anion gap: 11 (ref 5–15)
BUN: 8 mg/dL (ref 8–23)
CO2: 23 mmol/L (ref 22–32)
Calcium: 9.4 mg/dL (ref 8.9–10.3)
Chloride: 104 mmol/L (ref 98–111)
Creatinine, Ser: 0.57 mg/dL — ABNORMAL LOW (ref 0.61–1.24)
GFR calc Af Amer: >60 mL/min (ref >60)
GFR calc non Af Amer: >60 mL/min (ref >60)
Glucose, Bld: 104 mg/dL — ABNORMAL HIGH (ref 70–99)
Potassium: 4.1 mmol/L (ref 3.5–5.1)
Sodium: 138 mmol/L (ref 135–145)
Total Bilirubin: 1.8 mg/dL — ABNORMAL HIGH (ref 0.3–1.2)
Total Protein: 6.6 g/dL (ref 6.5–8.1)

## 2019-12-15 LAB — FOLATE RBC
Folate, Hemolysate: 346 ng/mL
Folate, RBC: 803 ng/mL (ref >498)
Hematocrit: 43.1 % (ref 37.5–51.0)

## 2019-12-15 LAB — PHOSPHORUS: Phosphorus: 2.6 mg/dL (ref 2.5–4.6)

## 2019-12-15 LAB — MAGNESIUM: Magnesium: 2 mg/dL (ref 1.7–2.4)

## 2019-12-15 MED ORDER — HYDROXYZINE HCL 10 MG PO TABS
10.0000 mg | ORAL_TABLET | Freq: Three times a day (TID) | ORAL | Status: DC | PRN
  Filled 2019-12-15: qty 1

## 2019-12-15 MED ORDER — CHLORDIAZEPOXIDE HCL 5 MG PO CAPS
10.0000 mg | ORAL_CAPSULE | Freq: Three times a day (TID) | ORAL | Status: DC
  Administered 2019-12-15 – 2019-12-19 (×14): 10 mg via ORAL
  Filled 2019-12-15 (×15): qty 2

## 2019-12-15 NOTE — Progress Notes (Signed)
Occupational Therapy Evaluation:  Clinical Impression Patient reports living alone in a single story home. Patient reports performing all self-care tasks and functional mobility with [facility] at [facility]. No Adaptive devices or equipment in home. Patient reports that he does not have anyone able to physically assist him with self-care tasks upon discharge. Overall, patient requires Minimal to Total assist for self-care tasks and Total assist for functional transfers. Patient is not able to maintain proper static standing balance to advance to mobilizing with rolling walker. Patient presents with decreased eye hand coordination, intentional tremors, decreased awareness of deficits, high fall risk and increase in level of assist with self-care/functional mobility. Patient will benefit from continued acute care skilled OT services.     12/15/19 1528  OT Visit Information  Assistance Needed +2  History of Present Illness Patient is 64 yo male with h/o alcoholism, last drink 2 days prior to admission , drinks about 1/5 Vodka a day. Patient states he was driving until 2 weeks ago and was [employer] as well.  He called EMS because he could not get up and walk. In the emergency room, patient was hemodynamically stable and afebrile.  Had uncontrolled blood press, alcohol level 49, shaky and tremulous and unsteady gait.  Admitted with benzodiazepine protocol.  Precautions  Precautions Fall  Restrictions  Weight Bearing Restrictions No  Home Living  Family/patient expects to be discharged to: Unsure  Living Arrangements Alone  Prior Function  Level of Independence Independent  Comments Patient reports daughters living in [place] and [place]. Not able to provide assistance upon discharge   Communication  Communication No difficulties  Pain Assessment  Pain Assessment No/denies pain  Cognition  Arousal/Alertness Lethargic  Behavior During Therapy Impulsive  Overall Cognitive Status No  family/caregiver present to determine baseline cognitive functioning  Area of Impairment Orientation;Following commands;Problem solving  Orientation Level Situation;Place  Following Commands Follows one step commands inconsistently;Follows multi-step commands inconsistently;Follows one step commands with increased time;Follows multi-step commands with increased time  Problem Solving Slow processing;Decreased initiation;Difficulty sequencing  General Comments Decreased insight into deficits   Upper Extremity Assessment  Upper Extremity Assessment Generalized weakness  Lower Extremity Assessment  Lower Extremity Assessment Defer to PT evaluation  ADL  Overall ADL's  Needs assistance/impaired  Eating/Feeding Maximal assistance  Grooming Oral care;Wash/dry face;Minimal assistance  Grooming Details (indicate cue type and reason) over and under shooting of mouth during oral care, increased difficulty with tremors  Upper Body Bathing Maximal assistance  Lower Body Bathing Total assistance  Upper Body Dressing  Maximal assistance  Lower Body Dressing Total assistance  Toilet Transfer Total assistance;+2 for physical assistance  Toileting- Clothing Manipulation and Hygiene Total assistance  Tub/ Shower Transfer Total assistance  Functional mobility during ADLs Maximal assistance  Vision- History  Baseline Vision/History No visual deficits  Perception  Perception Tested? Yes  Perception Deficits Spatial orientation  Spatial deficits visuospatial deficits present   Bed Mobility  Overal bed mobility Needs Assistance  Bed Mobility Supine to Sit;Sit to Supine  Supine to sit Max assist  Sit to supine Max assist  Transfers  Overall transfer level Needs assistance  Equipment used 2 person hand held assist  Sit to Stand Total assist;+2 physical assistance  Balance  Overall balance assessment Needs assistance  Sitting balance-Leahy Scale Poor  Standing balance-Leahy Scale Poor  OT - End of  Session  Activity Tolerance Patient limited by lethargy  Patient left in bed;with call bell/phone within reach;with nursing/sitter in room;with bed alarm set  Nurse  Communication Mobility status  OT Assessment  OT Recommendation/Assessment Patient needs continued OT Services  OT Visit Diagnosis Unsteadiness on feet (R26.81);Muscle weakness (generalized) (M62.81)  OT Problem List Decreased strength;Decreased activity tolerance;Impaired balance (sitting and/or standing);Decreased coordination;Decreased cognition;Decreased safety awareness;Decreased knowledge of use of DME or AE;Decreased knowledge of precautions;Impaired vision/perception  OT Plan  OT Frequency (ACUTE ONLY) Min 2X/week  OT Treatment/Interventions (ACUTE ONLY) Self-care/ADL training;Therapeutic exercise;Energy conservation;Therapeutic activities;Patient/family education;Balance training;Visual/perceptual remediation/compensation;Cognitive remediation/compensation;DME and/or AE instruction  AM-PAC OT "6 Clicks" Daily Activity Outcome Measure (Version 2)  Help from another person eating meals? 2  Help from another person taking care of personal grooming? 2  Help from another person toileting, which includes using toliet, bedpan, or urinal? 1  Help from another person bathing (including washing, rinsing, drying)? 1  Help from another person to put on and taking off regular upper body clothing? 2  Help from another person to put on and taking off regular lower body clothing? 1  6 Click Score 9  OT Recommendation  Follow Up Recommendations CIR  OT Equipment 3 in 1 bedside commode  Individuals Consulted  Consulted and Agree with Results and Recommendations Patient  Acute Rehab OT Goals  Patient Stated Goal to get back my eye hand coordination  OT Goal Formulation With patient  Time For Goal Achievement 12/29/19  Potential to Achieve Goals Fair  OT Time Calculation  OT Start Time (ACUTE ONLY) 1509  OT Stop Time (ACUTE ONLY) 1535   OT Time Calculation (min) 26 min  OT General Charges  $OT Visit 1 Visit  OT Evaluation  $OT Eval Moderate Complexity 1 Mod  OT Treatments  $Self Care/Home Management  8-22 mins  Written Expression  Dominant Hand Right   Catalena Stanhope OTR/L

## 2019-12-15 NOTE — Progress Notes (Signed)
PROGRESS NOTE    Danny Kerr  [id number] DOB: April 14, 1956 DOA: 12/12/2019 PCP: Velna Hatchet, MD    Brief Narrative:  64 yo male with h/o alcoholism, last drink 2 days prior to admission , drinks about 1/5 Vodka a day. Last rehab 07/2019 at [facility] for one month and resorted back drinking.  His wife divorced with him that month.  Came back home and started living air-BnB.  AA meetings are closed onsite.  Patient states he was driving until 2 weeks ago.  He called EMS because he could not get up and walk.  In the emergency room, patient was hemodynamically stable and afebrile.  Had uncontrolled blood pressure, alcohol level 49, shaky and tremulous and unsteady gait.  Admitted with benzodiazepine protocol.  Assessment & Plan:   Active Problems:   Alcohol withdrawal ([place])  Alcoholism with alcohol related disorder, alcohol withdrawal with DTs: Will need continuing monitoring in the hospital. Fall precautions, delirium precautions.  Continue with [employer] cabin [employer]. Maintenance IV fluids, multivitamins.  Seizure precautions. Family reported good response to long-acting benzodiazepine, will try scheduled Librium today with Atarax.  Clonidine protocol for high blood pressure. Close monitoring of electrolytes and replacements. Trazodone at night. He has used Lexapro in the past, resumed.  Unsteady gait/neuropathy: Probably related to chronic alcoholism.    Magnesium and phosphorus checked and replaced.   B12 normal.  TSH normal.  Folic acid and D2 levels pending.   no evidence of bacterial infection.  Work with PT OT.  May need physical rehab along with alcohol rehab once improves with withdrawals.  Hypomagnesemia: Replaced and normalized.   2/23: Contacted by patient's ex-wife, Ms. Exzavion Zelenak who is patient's next of kin and contact person on the medical records.  Updated.  Social worker updated, exploring long-term rehab options.   DVT prophylaxis: SCDs Code Status:  Full code Family Communication: Patient's ex-wife. Disposition Plan: patient is from home. Anticipated DC to unknown, Barriers to discharge active treatment for alcohol withdrawal syndrome.   Consultants:   None  Procedures:   None  Antimicrobials:   None   Subjective: Patient seen and examined.  Remains impulsive and trying to get out of the bed at times.  He is shaky tremulous.  He was asking whether he is going home today. Objective: Vitals:   12/14/19 1402 12/14/19 1919 12/14/19 2233 12/15/19 0327  BP: (!) 128/98 115/82 (!) 134/93 (!) 131/95  Pulse: (!) 105 (!) 104 (!) 103 (!) 101  Resp: 14 18 18 16   Temp: 98.3 F (36.8 C) 98.2 F (36.8 C) (!) 97.5 F (36.4 C) 98.2 F (36.8 C)  TempSrc: Oral Oral Oral Oral  SpO2: 98% 98% 100% 99%  Weight:      Height:        Intake/Output Summary (Last 24 hours) at 12/15/2019 1107 Last data filed at 12/15/2019 0844 Gross per 24 hour  Intake 170 ml  Output -  Net 170 ml   Filed Weights   12/12/19 1358 12/12/19 2112  Weight: 99.8 kg 88.1 kg    Examination:  Physical Exam  Constitutional: He is oriented to person, place, and time and well-developed, well-nourished, and in no distress.  Anxious, tremulous, shaky and blushed  HENT:  Head: Normocephalic.  Eyes: Pupils are equal, round, and reactive to light.  Cardiovascular: Normal rate and regular rhythm.  Pulmonary/Chest: Breath sounds normal.  Musculoskeletal:        General: No deformity or edema.     Cervical back: Neck supple.  Neurological: He is alert and oriented to person, place, and time.  Psychiatric:  Labile mood.  Anxious.      Data Reviewed: I have personally reviewed following labs and imaging studies  CBC: Recent Labs  Lab 12/12/19 1445 12/13/19 0537 12/14/19 0509 12/15/19 0445  WBC 5.5 4.5 3.8* 5.3  NEUTROABS 4.8  --  2.4 3.6  HGB 15.0 14.5 14.5 14.9  HCT 43.1 42.3 42.2 44.0  MCV 94.1 94.8 94.6 95.4  PLT 96* 80* 71* 82*   Basic  Metabolic Panel: Recent Labs  Lab 12/12/19 1445 12/13/19 0537 12/14/19 0509 12/15/19 0445  NA 138  --  135 138  K 3.7  --  3.3* 4.1  CL 97*  --  101 104  CO2 20*  --  24 23  GLUCOSE 92  --  105* 104*  BUN 12  --  7* 8  CREATININE 0.62  --  0.55* 0.57*  CALCIUM 9.0  --  9.0 9.4  MG 1.7 1.7 1.8 2.0  PHOS  --  3.1 3.6 2.6   GFR: Estimated Creatinine Clearance: 111.5 mL/min (A) (by C-G formula based on SCr of 0.57 mg/dL (L)). Liver Function Tests: Recent Labs  Lab 12/12/19 1445 12/14/19 0509 12/15/19 0445  AST 101* 51* 60*  ALT 34 23 27  ALKPHOS 117 93 93  BILITOT 2.0* 2.1* 1.8*  PROT 7.6 6.5 6.6  ALBUMIN 4.4 3.7 3.9   No results for input(s): LIPASE, AMYLASE in the last 168 hours. No results for input(s): AMMONIA in the last 168 hours. Coagulation Profile: Recent Labs  Lab 12/14/19 0509  INR 1.0   Cardiac Enzymes: No results for input(s): CKTOTAL, CKMB, CKMBINDEX, TROPONINI in the last 168 hours. BNP (last 3 results) No results for input(s): PROBNP in the last 8760 hours. HbA1C: No results for input(s): HGBA1C in the last 72 hours. CBG: No results for input(s): GLUCAP in the last 168 hours. Lipid Profile: No results for input(s): CHOL, HDL, LDLCALC, TRIG, CHOLHDL, LDLDIRECT in the last 72 hours. Thyroid Function Tests: Recent Labs    12/13/19 1148  TSH 2.358   Anemia Panel: Recent Labs    12/12/19 1445 12/13/19 1148  VITAMINB12 287 339   Sepsis Labs: No results for input(s): PROCALCITON, LATICACIDVEN in the last 168 hours.  Recent Results (from the past 240 hour(s))  SARS CORONAVIRUS 2 (TAT 6-24 HRS) Nasopharyngeal Nasopharyngeal Swab     Status: None   Collection Time: 12/12/19  7:38 PM   Specimen: Nasopharyngeal Swab  Result Value Ref Range Status   SARS Coronavirus 2 NEGATIVE NEGATIVE Final    Comment: (NOTE) SARS-CoV-2 target nucleic acids are NOT DETECTED. The SARS-CoV-2 RNA is generally detectable in upper and lower respiratory specimens  during the acute phase of infection. Negative results do not preclude SARS-CoV-2 infection, do not rule out co-infections with other pathogens, and should not be used as the sole basis for treatment or other patient management decisions. Negative results must be combined with clinical observations, patient history, and epidemiological information. The expected result is Negative. Fact Sheet for Patients: SugarRoll.be Fact Sheet for Healthcare Providers: https://www.woods-mathews.com/ This test is not yet approved or cleared by the [place] FDA and  has been authorized for detection and/or diagnosis of SARS-CoV-2 by FDA under an Emergency Use Authorization (EUA). This EUA will remain  in effect (meaning this test can be used) for the duration of the COVID-19 declaration under Section 56 4(b)(1) of the Act, 21 U.S.C. section 360bbb-3(b)(1), unless the  authorization is terminated or revoked sooner. Performed at [facility], [place] [address], [place], [place] [address]          Radiology Studies: No results found.      Scheduled Meds: . chlordiazePOXIDE  10 mg Oral TID  . escitalopram  10 mg Oral Daily  . feeding supplement (ENSURE ENLIVE)  237 mL Oral BID BM  . folic acid  1 mg Oral Daily  . multivitamin with minerals  1 tablet Oral Daily  . pantoprazole  40 mg Oral BID  . potassium chloride  40 mEq Oral BID  . thiamine  100 mg Oral Daily   Or  . thiamine  100 mg Intravenous Daily   Continuous Infusions:    LOS: 3 days    Time spent: 30 minutes    Barb Merino, MD [facility] Pager [phone]

## 2019-12-15 NOTE — Clinical Social Work Note (Signed)
Attempted to call wife.  No answer and unable to leave v/m message.

## 2019-12-15 NOTE — Evaluation (Signed)
Physical Therapy Evaluation Patient Details Name: Danny Kerr MRN: [id number] DOB: 12/24/1955 Today's Date: 12/15/2019   History of Present Illness  Patient is 64 yo male with h/o alcoholism, last drink 2 days prior to admission , drinks about 1/5 Vodka a day. Patient states he was driving until 2 weeks ago and was [employer] as well.  He called EMS because he could not get up and walk. In the emergency room, patient was hemodynamically stable and afebrile.  Had uncontrolled blood press, alcohol level 49, shaky and tremulous and unsteady gait.  Admitted with benzodiazepine protocol.    Clinical Impression  Danny Kerr is 64 y.o. male admitted with above HPI and diagnosis. Patient is currently limited by functional impairments below (see PT problem list). Patient lives alone and has been independent at baseline prior to being unable to walk ~2 days ago possibly secondary to alcohol w/d. Patient will benefit from continued skilled PT interventions to address impairments and progress independence with mobility, recommending 24/7 supervision/assist and ANF placement at this time. Acute PT will follow and progress as able.     Follow Up Recommendations Supervision/Assistance - 24 hour;SNF    Equipment Recommendations  Other (comment)(TBD)    Recommendations for Other Services       Precautions / Restrictions Precautions Precautions: Fall Restrictions Weight Bearing Restrictions: No      Mobility  Bed Mobility Overal bed mobility: Needs Assistance Bed Mobility: Supine to Sit;Sit to Supine     Supine to sit: +2 for physical assistance;HOB elevated;+2 for safety/equipment;Max assist Sit to supine: HOB elevated;+2 for safety/equipment;+2 for physical assistance;Mod assist   General bed mobility comments: pt required verbal/tactile cues for sequenicng bed mob to transfer supine to sit. Required max assist to raise trunk upright and became more alert with sitting. Pt requried  max assist to maintain seated balance initially and progressed to min guard/min assist intermittently with bil UE support. Pt required mod assist +2 for sit to supine with assist needed to raise LE's into bed. Max +2 for repositioning to scoot up in bed.  Transfers Overall transfer level: Needs assistance Equipment used: 2 person hand held assist Transfers: Sit to/from Stand Sit to Stand: +2 physical assistance;+2 safety/equipment;From elevated surface;Mod assist         General transfer comment: 2+ assist for safety as pt unsteady with transfer. Pt noted to have significant posterior and Lt lean in standing and requried verbal/manual cues to adjust posture for more midline stance but unable to obtain fully.  Ambulation/Gait Ambulation/Gait assistance: +2 physical assistance;+2 safety/equipment;Mod assist;Max assist Gait Distance (Feet): 2 Feet         General Gait Details: pt unable to sequence side stepping with 2HHA and moderate assist. Pt performed Rt side step x2 and Lt x1 with Max assist +2 to prevent LOB and multimodal cues for Lt LE movement.  Stairs            Wheelchair Mobility    Modified Rankin (Stroke Patients Only)       Balance Overall balance assessment: Needs assistance Sitting-balance support: Bilateral upper extremity supported;Feet supported Sitting balance-Leahy Scale: Poor   Postural control: Posterior lean;Left lateral lean Standing balance support: Bilateral upper extremity supported Standing balance-Leahy Scale: Poor          Pertinent Vitals/Pain Pain Assessment: Faces Faces Pain Scale: No hurt    Home Living Family/patient expects to be discharged to:: Unsure(Pt has been staying in AirBnB) Living Arrangements: Alone  Additional Comments: Pt no oriented to place, time, or situation. He is unable to provide home set-up/environment but did confirm he is staying at an AirBnB currently.    Prior Function Level of Independence:  Independent         Comments: Per chart: last rehab stay was 07/2019 at [facility] for one month, pt resorted back to drinking. His wife divorced with him that month. Came back home and started living air-BnB. AA meetings are closed onsite.     Hand Dominance        Extremity/Trunk Assessment   Upper Extremity Assessment Upper Extremity Assessment: Defer to OT evaluation;Generalized weakness    Lower Extremity Assessment Lower Extremity Assessment: Generalized weakness    Cervical / Trunk Assessment Cervical / Trunk Assessment: Normal  Communication      Cognition Arousal/Alertness: Lethargic Behavior During Therapy: WFL for tasks assessed/performed Overall Cognitive Status: No family/caregiver present to determine baseline cognitive functioning Area of Impairment: Orientation;Following commands;Problem solving        Orientation Level: Disoriented to;Place;Time;Situation     Following Commands: Follows one step commands inconsistently;Follows multi-step commands inconsistently;Follows one step commands with increased time;Follows multi-step commands with increased time     Problem Solving: Slow processing;Decreased initiation;Difficulty sequencing General Comments: pt with some awarenss of difficulty walking/mobilizing. pt with poor awareness of deficits stating "I can do everything but walking" and demonstrating moving his LE's around in bed for SLR and heel slide. pt givnig therapist innapropriate gestures when asked if he uses any assistive device to mobilize.      General Comments      Exercises     Assessment/Plan    PT Assessment Patient needs continued PT services  PT Problem List Decreased coordination;Decreased strength;Decreased mobility;Decreased safety awareness;Decreased activity tolerance;Decreased balance       PT Treatment Interventions DME instruction;Therapeutic exercise;Gait training;Balance training;Neuromuscular re-education;Stair  training;Functional mobility training;Therapeutic activities;Patient/family education    PT Goals (Current goals can be found in the Care Plan section)  Acute Rehab PT Goals Patient Stated Goal: to ge tback to walking and golfing PT Goal Formulation: With patient Time For Goal Achievement: 12/29/19 Potential to Achieve Goals: Good    Frequency Min 3X/week   Barriers to discharge   alcohol and withdrawal syndrome - pt may benefit from rehab placement, SW has already been consulted.       AM-PAC PT "6 Clicks" Mobility  Outcome Measure Help needed turning from your back to your side while in a flat bed without using bedrails?: A Lot Help needed moving from lying on your back to sitting on the side of a flat bed without using bedrails?: Total Help needed moving to and from a bed to a chair (including a wheelchair)?: Total Help needed standing up from a chair using your arms (e.g., wheelchair or bedside chair)?: Total Help needed to walk in hospital room?: Total Help needed climbing 3-5 steps with a railing? : Total 6 Click Score: 7    End of Session Equipment Utilized During Treatment: Gait belt Activity Tolerance: Patient limited by lethargy Patient left: in bed;with nursing/sitter in room;with call bell/phone within reach;with bed alarm set Nurse Communication: Mobility status PT Visit Diagnosis: Difficulty in walking, not elsewhere classified (R26.2);Unsteadiness on feet (R26.81);Muscle weakness (generalized) (M62.81)    Time: [id number] PT Time Calculation (min) (ACUTE ONLY): 24 min   Charges:   PT Evaluation $PT Eval Low Complexity: 1 Low PT Treatments $Therapeutic Activity: 8-22 mins       Gwynneth Albright. PT,  DPT Physical Therapist with [facility] [phone]  12/15/2019 12:30 PM

## 2019-12-16 DIAGNOSIS — R5381 Other malaise: Secondary | ICD-10-CM

## 2019-12-16 DIAGNOSIS — E876 Hypokalemia: Secondary | ICD-10-CM

## 2019-12-16 LAB — CBC WITH DIFFERENTIAL/PLATELET
Abs Immature Granulocytes: 0.03 10*3/uL (ref 0.00–0.07)
Basophils Absolute: 0 10*3/uL (ref 0.0–0.1)
Basophils Relative: 1 %
Eosinophils Absolute: 0.1 10*3/uL (ref 0.0–0.5)
Eosinophils Relative: 2 %
HCT: 43.7 % (ref 39.0–52.0)
Hemoglobin: 14.5 g/dL (ref 13.0–17.0)
Immature Granulocytes: 0 %
Lymphocytes Relative: 13 %
Lymphs Abs: 0.9 10*3/uL (ref 0.7–4.0)
MCH: 32.3 pg (ref 26.0–34.0)
MCHC: 33.2 g/dL (ref 30.0–36.0)
MCV: 97.3 fL (ref 80.0–100.0)
Monocytes Absolute: 1 10*3/uL (ref 0.1–1.0)
Monocytes Relative: 15 %
Neutro Abs: 4.8 10*3/uL (ref 1.7–7.7)
Neutrophils Relative %: 69 %
Platelets: 111 10*3/uL — ABNORMAL LOW (ref 150–400)
RBC: 4.49 MIL/uL (ref 4.22–5.81)
RDW: 15.1 % (ref 11.5–15.5)
WBC: 6.9 10*3/uL (ref 4.0–10.5)
nRBC: 0 % (ref 0.0–0.2)

## 2019-12-16 LAB — COMPREHENSIVE METABOLIC PANEL
ALT: 28 U/L (ref 0–44)
AST: 52 U/L — ABNORMAL HIGH (ref 15–41)
Albumin: 3.7 g/dL (ref 3.5–5.0)
Alkaline Phosphatase: 89 U/L (ref 38–126)
Anion gap: 8 (ref 5–15)
BUN: 15 mg/dL (ref 8–23)
CO2: 26 mmol/L (ref 22–32)
Calcium: 9.4 mg/dL (ref 8.9–10.3)
Chloride: 103 mmol/L (ref 98–111)
Creatinine, Ser: 0.64 mg/dL (ref 0.61–1.24)
GFR calc Af Amer: >60 mL/min (ref >60)
GFR calc non Af Amer: >60 mL/min (ref >60)
Glucose, Bld: 116 mg/dL — ABNORMAL HIGH (ref 70–99)
Potassium: 3.8 mmol/L (ref 3.5–5.1)
Sodium: 137 mmol/L (ref 135–145)
Total Bilirubin: 1.5 mg/dL — ABNORMAL HIGH (ref 0.3–1.2)
Total Protein: 6.8 g/dL (ref 6.5–8.1)

## 2019-12-16 MED ORDER — ENOXAPARIN SODIUM 40 MG/0.4ML ~~LOC~~ SOLN
40.0000 mg | SUBCUTANEOUS | Status: DC
  Administered 2019-12-16 – 2019-12-17 (×2): 40 mg via SUBCUTANEOUS
  Filled 2019-12-16 (×3): qty 0.4

## 2019-12-16 NOTE — Progress Notes (Addendum)
PROGRESS NOTE  Danny Kerr [id number] DOB: [birth year] DOA: 12/12/2019 PCP: Velna Hatchet, MD   LOS: 4 days   Brief narrative: As per HPI,  64 yo male with history of alcoholism, last drink 2 days prior to admission , drinks about 1/5 Vodka a day. Last rehab 07/2019 at [facility] for one month and resorted back drinking.  His wife divorced with him that month.  Came back home and started living on air-BnB.  AA meetings are closed onsite.  Patient stated that he was driving until 2 weeks ago.  He called EMS because he could not get up and walk. In the emergency room, patient was hemodynamically stable and afebrile.  Had uncontrolled blood pressure, alcohol level 49, shaky and tremulous and unsteady gait.  Admitted to the hospital for alcohol withdrawal.  Assessment/Plan:  Active Problems:   Alcohol withdrawal ([place])  Alcoholism with alcohol use disorder, alcohol withdrawal. Patient has visual hallucinations and some tremors.   Seizure precautions.  Continue IV fluids multivitamins.  On Librium and Atarax.  Clonidine as needed for high blood pressure.  Continue trazodone as needed sleep.  Continue Lexapro from home.  CIWA protocol.  Unsteady gait/with alcohol induced peripheral neuropathy: B12 normal.  TSH normal.    RBC folate within normal limits.   25 hydroxy vitamin D level pending.  Seen by physical therapy who recommended skilled nursing facility placement for rehab.  Hypomagnesemia: Replaced and normalized.   Hypokalemia.  Replenished and improved.  Debility, deconditioning, difficulty with gait.  Physical therapy has seen the patient and recommended skilled nursing facility for rehab.   VTE Prophylaxis: Lovenox subcu  Code Status: Full code  Family Communication: I tried to reach their spouse on the phone but was unable to speak with her.   Disposition Plan:  . Patient is from home . Likely disposition to substance abuse rehab/SNF, undetermined at this  time . Barriers to discharge: Pending clinical improvement, pending disposition plan.   Consultants:  None  Procedures:  None  Antibiotics:  . None  Anti-infectives (From admission, onward)   None     Subjective: Today, patient was seen and examined at bedside.  Patient complains of mild visual hallucinations and tremors.  Objective: Vitals:   12/16/19 0609 12/16/19 1417  BP:  119/84  Pulse:  89  Resp: 20 20  Temp: 99 F (37.2 C) 98.1 F (36.7 C)  SpO2:  96%    Intake/Output Summary (Last 24 hours) at 12/16/2019 1422 Last data filed at 12/16/2019 0900 Gross per 24 hour  Intake 600 ml  Output 950 ml  Net -350 ml   Filed Weights   12/12/19 1358 12/12/19 2112  Weight: 99.8 kg 88.1 kg   Body mass index is 24.28 kg/m.   Physical Exam: GENERAL: Patient is alert awake and communicative not in obvious distress.  Oriented HENT: No scleral pallor or icterus. Pupils equally reactive to light. Oral mucosa is moist NECK: is supple, no gross swelling noted. CHEST: Clear to auscultation. No crackles or wheezes.  Diminished breath sounds bilaterally. CVS: S1 and S2 heard, no murmur. Regular rate and rhythm.  ABDOMEN: Soft, non-tender, bowel sounds are present. EXTREMITIES: No edema.  Right upper extremity tremor CNS: Cranial nerves are intact. No focal motor deficits.  Mild right hand tremor. SKIN: warm and dry without rashes.  Data Review: I have personally reviewed the following laboratory data and studies,  CBC: Recent Labs  Lab 12/12/19 1445 12/12/19 1445 12/13/19 0537 12/13/19 1148 12/14/19 [id number]  12/15/19 0445 12/16/19 0520  WBC 5.5  --  4.5  --  3.8* 5.3 6.9  NEUTROABS 4.8  --   --   --  2.4 3.6 4.8  HGB 15.0  --  14.5  --  14.5 14.9 14.5  HCT 43.1   < > 42.3 43.1 42.2 44.0 43.7  MCV 94.1  --  94.8  --  94.6 95.4 97.3  PLT 96*  --  80*  --  71* 82* 111*   < > = values in this interval not displayed.   Basic Metabolic Panel: Recent Labs  Lab  12/12/19 1445 12/13/19 0537 12/14/19 0509 12/15/19 0445 12/16/19 0520  NA 138  --  135 138 137  K 3.7  --  3.3* 4.1 3.8  CL 97*  --  101 104 103  CO2 20*  --  24 23 26   GLUCOSE 92  --  105* 104* 116*  BUN 12  --  7* 8 15  CREATININE 0.62  --  0.55* 0.57* 0.64  CALCIUM 9.0  --  9.0 9.4 9.4  MG 1.7 1.7 1.8 2.0  --   PHOS  --  3.1 3.6 2.6  --    Liver Function Tests: Recent Labs  Lab 12/12/19 1445 12/14/19 0509 12/15/19 0445 12/16/19 0520  AST 101* 51* 60* 52*  ALT 34 23 27 28   ALKPHOS 117 93 93 89  BILITOT 2.0* 2.1* 1.8* 1.5*  PROT 7.6 6.5 6.6 6.8  ALBUMIN 4.4 3.7 3.9 3.7   No results for input(s): LIPASE, AMYLASE in the last 168 hours. No results for input(s): AMMONIA in the last 168 hours. Cardiac Enzymes: No results for input(s): CKTOTAL, CKMB, CKMBINDEX, TROPONINI in the last 168 hours. BNP (last 3 results) No results for input(s): BNP in the last 8760 hours.  ProBNP (last 3 results) No results for input(s): PROBNP in the last 8760 hours.  CBG: No results for input(s): GLUCAP in the last 168 hours. Recent Results (from the past 240 hour(s))  SARS CORONAVIRUS 2 (TAT 6-24 HRS) Nasopharyngeal Nasopharyngeal Swab     Status: None   Collection Time: 12/12/19  7:38 PM   Specimen: Nasopharyngeal Swab  Result Value Ref Range Status   SARS Coronavirus 2 NEGATIVE NEGATIVE Final    Comment: (NOTE) SARS-CoV-2 target nucleic acids are NOT DETECTED. The SARS-CoV-2 RNA is generally detectable in upper and lower respiratory specimens during the acute phase of infection. Negative results do not preclude SARS-CoV-2 infection, do not rule out co-infections with other pathogens, and should not be used as the sole basis for treatment or other patient management decisions. Negative results must be combined with clinical observations, patient history, and epidemiological information. The expected result is Negative. Fact Sheet for  Patients: SugarRoll.be Fact Sheet for Healthcare Providers: https://www.woods-mathews.com/ This test is not yet approved or cleared by the [place] FDA and  has been authorized for detection and/or diagnosis of SARS-CoV-2 by FDA under an Emergency Use Authorization (EUA). This EUA will remain  in effect (meaning this test can be used) for the duration of the COVID-19 declaration under Section 56 4(b)(1) of the Act, 21 U.S.C. section 360bbb-3(b)(1), unless the authorization is terminated or revoked sooner. Performed at [facility], [place] [address], [place], [place] [address]      Studies: No results found.    Flora Lipps, MD  [facility] 12/16/2019

## 2019-12-16 NOTE — Plan of Care (Signed)
  Problem: Clinical Measurements: Goal: Respiratory complications will improve Outcome: Progressing   Problem: Clinical Measurements: Goal: Cardiovascular complication will be avoided Outcome: Progressing   Problem: Activity: Goal: Risk for activity intolerance will decrease Outcome: Progressing   Problem: Nutrition: Goal: Adequate nutrition will be maintained Outcome: Progressing   Problem: Coping: Goal: Level of anxiety will decrease Outcome: Progressing   Problem: Elimination: Goal: Will not experience complications related to bowel motility Outcome: Progressing   

## 2019-12-16 NOTE — TOC Initial Note (Signed)
Transition of Care [facility]) - Initial/Assessment Note    Patient Details  Name: Danny Kerr MRN: [id number] Date of Birth: 02-26-1956  Transition of Care [facility]) CM/SW Contact:    Trish Mage, LCSW Phone Number: 12/16/2019, 9:55 AM  Clinical Narrative:   Patient here for alcohol withdrawal, wife is at bedside and he gives me permission to speak with her as well.  He is actively hallucinating while we speak, admits to feeling "foggy and loopy."  States he has been working an intensive outpatient program since he got out of [employer] in November; wife states he has been cut loose from the [facility] IOP program due to non-compliance and that he began drinking 2 days after release from [employer].  He admits to drinking up to a 5th of vodka a day, and states he wants help. However, he does appear ambivalent as he initially states he can do it working the same program.  I point out that has not been working, so it is bound to fail.  He agreed to let me send his information to 28 day programs.  This is also what his wife wants, stating that although finances are a challenge since he has been unable to work, she is in the process of selling their home and will have revenue as a result of that.  She is also working.  Sent info out to [place].  Wife states last time he went to [facility] is not ambulatory, but they were still willing to do an admission. He has a 15 year history of drinking himself to unconsciousness on a daily basis.   TOC will continue to follow during the course of hospitalization.               Expected Discharge Plan: (SA rehab v SNF) Barriers to Discharge: Other (comment)(Unknown disposition plan)   Patient Goals and CMS Choice Patient states their goals for this hospitalization and ongoing recovery are:: "I want to kick this thing."      Expected Discharge Plan and Services Expected Discharge Plan: (SA rehab v SNF) In-house Referral:  Clinical Social Work   Post Acute Care Choice: (SA rehab) Living arrangements for the past 2 months: No permanent address                                      Prior Living Arrangements/Services Living arrangements for the past 2 months: No permanent address Lives with:: Self Patient language and need for interpreter reviewed:: Yes        Need for Family Participation in Patient Care: Yes (Comment) Care giver support system in place?: Yes (comment)   Criminal Activity/Legal Involvement Pertinent to Current Situation/Hospitalization: No - Comment as needed  Activities of Daily Living Home Assistive Devices/Equipment: None ADL Screening (condition at time of admission) Patient's cognitive ability adequate to safely complete daily activities?: No Is the patient deaf or have difficulty hearing?: No Does the patient have difficulty seeing, even when wearing glasses/contacts?: No Does the patient have difficulty concentrating, remembering, or making decisions?: Yes Patient able to express need for assistance with ADLs?: Yes Does the patient have difficulty dressing or bathing?: Yes Independently performs ADLs?: Yes (appropriate for developmental age) Does the patient have difficulty walking or climbing stairs?: Yes Weakness of Legs: Both Weakness of Arms/Hands: None  Permission Sought/Granted Permission sought to share information with : Family  Supports, Chartered certified accountant granted to share information with : Yes, Verbal Permission Granted  Share Information with NAME: Quintez Sobieck, wife   [employer] Controls           Emotional Assessment Appearance:: Appears older than stated age Attitude/Demeanor/Rapport: Engaged Affect (typically observed): Constricted Orientation: : Oriented to Self Alcohol / Substance Use: Alcohol Use Psych Involvement: No (comment)  Admission diagnosis:  Alcohol withdrawal ([place])  [F10.239] Alcohol withdrawal syndrome without complication ([place]) [id number] Patient Active Problem List   Diagnosis Date Noted  . Alcohol withdrawal ([place]) 12/12/2019  . Thrombocytopenia ([place])   . Alcohol use disorder, severe, in early remission, in controlled environment ([place]) 10/03/2019  . 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

## 2019-12-16 NOTE — TOC Progression Note (Signed)
Transition of Care [facility]) - Progression Note    Patient Details  Name: Danny Kerr MRN: [id number] Date of Birth: 21-Nov-1955  Transition of Care [facility]) CM/SW [place], [place] Phone Number: 12/16/2019, 3:05 PM  Clinical Narrative:  Danny Kerr back from [facility].  Patient is still too acute to go to their program-they will reconsider with updated clinicals stating that he can ambulate better.  [facility] extended bed offer.  Patient/family would need to come up with [phone] up front.  Danny Kerr. Contacted wife with this information.  TOC will continue to follow during the course of hospitalization.      Expected Discharge Plan: (SA rehab v SNF) Barriers to Discharge: Other (comment)(Unknown disposition plan)  Expected Discharge Plan and Services Expected Discharge Plan: (SA rehab v SNF) In-house Referral: Clinical Social Work   Post Acute Care Choice: (SA rehab) Living arrangements for the past 2 months: No permanent address                                       Social Determinants of Health (SDOH) Interventions    Readmission Risk Interventions No flowsheet data found.

## 2019-12-17 LAB — COMPREHENSIVE METABOLIC PANEL
ALT: 27 U/L (ref 0–44)
AST: 49 U/L — ABNORMAL HIGH (ref 15–41)
Albumin: 3.7 g/dL (ref 3.5–5.0)
Alkaline Phosphatase: 80 U/L (ref 38–126)
Anion gap: 10 (ref 5–15)
BUN: 14 mg/dL (ref 8–23)
CO2: 25 mmol/L (ref 22–32)
Calcium: 9 mg/dL (ref 8.9–10.3)
Chloride: 100 mmol/L (ref 98–111)
Creatinine, Ser: 0.63 mg/dL (ref 0.61–1.24)
GFR calc Af Amer: >60 mL/min (ref >60)
GFR calc non Af Amer: >60 mL/min (ref >60)
Glucose, Bld: 99 mg/dL (ref 70–99)
Potassium: 3.3 mmol/L — ABNORMAL LOW (ref 3.5–5.1)
Sodium: 135 mmol/L (ref 135–145)
Total Bilirubin: 1.3 mg/dL — ABNORMAL HIGH (ref 0.3–1.2)
Total Protein: 6.7 g/dL (ref 6.5–8.1)

## 2019-12-17 LAB — CBC WITH DIFFERENTIAL/PLATELET
Abs Immature Granulocytes: 0.01 10*3/uL (ref 0.00–0.07)
Basophils Absolute: 0 10*3/uL (ref 0.0–0.1)
Basophils Relative: 1 %
Eosinophils Absolute: 0.2 10*3/uL (ref 0.0–0.5)
Eosinophils Relative: 4 %
HCT: 41.6 % (ref 39.0–52.0)
Hemoglobin: 14.3 g/dL (ref 13.0–17.0)
Immature Granulocytes: 0 %
Lymphocytes Relative: 26 %
Lymphs Abs: 1.1 10*3/uL (ref 0.7–4.0)
MCH: 32.8 pg (ref 26.0–34.0)
MCHC: 34.4 g/dL (ref 30.0–36.0)
MCV: 95.4 fL (ref 80.0–100.0)
Monocytes Absolute: 0.9 10*3/uL (ref 0.1–1.0)
Monocytes Relative: 21 %
Neutro Abs: 2.1 10*3/uL (ref 1.7–7.7)
Neutrophils Relative %: 48 %
Platelets: 112 10*3/uL — ABNORMAL LOW (ref 150–400)
RBC: 4.36 MIL/uL (ref 4.22–5.81)
RDW: 14.4 % (ref 11.5–15.5)
WBC: 4.4 10*3/uL (ref 4.0–10.5)
nRBC: 0 % (ref 0.0–0.2)

## 2019-12-17 LAB — METHYLMALONIC ACID, SERUM: Methylmalonic Acid, Quantitative: 118 nmol/L (ref 0–378)

## 2019-12-17 LAB — MAGNESIUM: Magnesium: 1.9 mg/dL (ref 1.7–2.4)

## 2019-12-17 LAB — 25-HYDROXY VITAMIN D LCMS D2+D3
25-Hydroxy, Vitamin D-2: 2.9 ng/mL
25-Hydroxy, Vitamin D-3: 23 ng/mL
25-Hydroxy, Vitamin D: 26 ng/mL — ABNORMAL LOW

## 2019-12-17 LAB — PHOSPHORUS: Phosphorus: 4.6 mg/dL (ref 2.5–4.6)

## 2019-12-17 MED ORDER — POTASSIUM CHLORIDE CRYS ER 20 MEQ PO TBCR
40.0000 meq | EXTENDED_RELEASE_TABLET | Freq: Every day | ORAL | Status: DC
  Administered 2019-12-17 – 2019-12-19 (×3): 40 meq via ORAL
  Filled 2019-12-17 (×3): qty 2

## 2019-12-17 NOTE — Progress Notes (Signed)
This shift pt walked the entire length of hall with walker x4. Stated that he felt good and hopes to ambulate without walker by Saturday.

## 2019-12-17 NOTE — TOC Progression Note (Signed)
Transition of Care [facility]) - Progression Note    Patient Details  Name: DUAINE LOCURTO MRN: [id number] Date of Birth: 05-Jul-1956  Transition of Care [facility]) CM/SW [place], [place] Phone Number: 12/17/2019, 2:59 PM  Clinical Narrative:   Based on PT note, called [facility] to find out if they offer [facility] PT services there.  They do not.  Some residents use canes, walkers and wheelchairs, but all are expected to do all ADLs.  Patient will need to decide whether to pursue SNF in the interim, or go home with [facility] services. TOC will continue to follow during the course of hospitalization.     Expected Discharge Plan: (SA rehab v SNF) Barriers to Discharge: Other (comment)(Unknown disposition plan)  Expected Discharge Plan and Services Expected Discharge Plan: (SA rehab v SNF) In-house Referral: Clinical Social Work   Post Acute Care Choice: (SA rehab) Living arrangements for the past 2 months: No permanent address                                       Social Determinants of Health (SDOH) Interventions    Readmission Risk Interventions No flowsheet data found.

## 2019-12-17 NOTE — Progress Notes (Signed)
Physical Therapy Treatment Patient Details Name: Danny Kerr MRN: [id number] DOB: Mar 26, 1956 Today's Date: 12/17/2019    History of Present Illness Patient is 64 yo male with h/o alcoholism, last drink 2 days prior to admission , drinks about 1/5 Vodka a day. Patient states he was driving until 2 weeks ago and was [employer] as well.  He called EMS because he could not get up and walk. In the emergency room, patient was hemodynamically stable and afebrile.  Had uncontrolled blood press, alcohol level 49, shaky and tremulous and unsteady gait.  Admitted with benzodiazepine protocol.    PT Comments    Patient made good progress with therapy today. He has improved safety awareness and recognizes his deficits for gait/balance. He was able to ambulate ~200' today with 1HHA and use of RW. Pt is unsteady throughout transfers and gait and reliant on  external support to prevent LOB. He has expressed interest in participating in alcohol/substance abuse rehabilitation as well as physical rehabilitation to improve mobility. He is concerned about being discharged home and does not feel it would be safe for him. Patient is highly motivated to participate in therapy and during session stated "im going to beat this". He will continue to benefit from skilled PT interventions to address impairments and progress mobility towards PLOF. Acute PT will progress as able.   Follow Up Recommendations  Supervision/Assistance - 24 hour;SNF(pt considering SA center, would need HHPT at center if able)     Equipment Recommendations  Other (comment);Rolling walker with 5" wheels(defer to facility or pt would need RW)    Recommendations for Other Services       Precautions / Restrictions Precautions Precautions: Fall Restrictions Weight Bearing Restrictions: No    Mobility  Bed Mobility Overal bed mobility: Needs Assistance Bed Mobility: Supine to Sit     Supine to sit: Min guard     General bed  mobility comments: pt with improved awareness and able to sequence supine to sit transfer from flat bed without cues or assist.  Transfers Overall transfer level: Needs assistance Equipment used: 1 person hand held assist;None Transfers: Sit to/from Stand Sit to Stand: Min assist         General transfer comment: pt able to initiate power up without assist from EOB and toilet. Pt requried 1HHA to steady with rising from EOB and use of grab bar next to toilet.  Ambulation/Gait Ambulation/Gait assistance: Min assist Gait Distance (Feet): 200 Feet Assistive device: Rolling walker (2 wheeled);1 person hand held assist Gait Pattern/deviations: Step-through pattern;Decreased stride length;Staggering right;Staggering left;Drifts right/left Gait velocity: decreased, fair with RW   General Gait Details: pt required 1HHA to steady with forward gait and min assist to steady, pt staggering to Rt>Lt during forward gait in hallway and mild shaking noted. Pt gait speed increased with use of RW, shaking remaind present intermittently.   Stairs        Wheelchair Mobility    Modified Rankin (Stroke Patients Only)       Balance Overall balance assessment: Needs assistance Sitting-balance support: Bilateral upper extremity supported;Feet supported Sitting balance-Leahy Scale: Good     Standing balance support: Bilateral upper extremity supported;Single extremity supported;During functional activity Standing balance-Leahy Scale: Fair Standing balance comment: pt reliant on external support to prevent LOB Single Leg Stance - Right Leg: 0 Single Leg Stance - Left Leg: 0 Tandem Stance - Right Leg: 0 Tandem Stance - Left Leg: 0       High Level Balance Comments:  pt required external support to obtain tandem stance position and was unable to hold without min assist from therapist            Cognition Arousal/Alertness: Awake/alert Behavior During Therapy: [facility] for tasks  assessed/performed Overall Cognitive Status: Within Functional Limits for tasks assessed          General Comments: Pt with much improved cognitive status, alert and oriented to situation. Pt with improved safety awareness and increased awareness of deficits. He is interested in possibly going to a SA center if he can get PT there as well. He is scared about possibly going home and being able to keep his sobriety. If he can't get PT at [facility] he would be open to SNF. As long term goal/plan he expressed interest in being able to go to [place] meeting 5x/week and participating in [place].      Exercises Other Exercises Other Exercises: 30 second chair rise test: pt performed 9 reps in 30 seconds, no UE use for power up however some momentum used.    General Comments General comments (skin integrity, edema, etc.): pt wtih decreased coordination for bil LE with heel to shin and RAM's. Pt UE's more limited with coordination for RAM's and finger to nose.      Pertinent Vitals/Pain Pain Assessment: No/denies pain    Home Living   Living Arrangements: Alone   Type of Home: House(pt in AirBnB) Home Access: Stairs to enter Entrance Stairs-Rails: Right Home Layout: Two level;Bed/bath upstairs(split level, bed upstairs)        Prior Function Level of Independence: Independent          PT Goals (current goals can now be found in the care plan section) Acute Rehab PT Goals Patient Stated Goal: tog et back to walking and "beat this thing" PT Goal Formulation: With patient Time For Goal Achievement: 12/29/19 Potential to Achieve Goals: Good Progress towards PT goals: Progressing toward goals    Frequency    Min 3X/week      PT Plan Current plan remains appropriate       AM-PAC PT "6 Clicks" Mobility   Outcome Measure  Help needed turning from your back to your side while in a flat bed without using bedrails?: None Help needed moving from lying on your back to sitting on the side of a  flat bed without using bedrails?: A Little Help needed moving to and from a bed to a chair (including a wheelchair)?: A Little Help needed standing up from a chair using your arms (e.g., wheelchair or bedside chair)?: A Little Help needed to walk in hospital room?: A Little Help needed climbing 3-5 steps with a railing? : A Little 6 Click Score: 19    End of Session Equipment Utilized During Treatment: Gait belt Activity Tolerance: Patient tolerated treatment well Patient left: in bed;with nursing/sitter in room;with call bell/phone within reach;with bed alarm set Nurse Communication: Mobility status PT Visit Diagnosis: Difficulty in walking, not elsewhere classified (R26.2);Unsteadiness on feet (R26.81);Muscle weakness (generalized) (M62.81)     Time: [id number] PT Time Calculation (min) (ACUTE ONLY): 38 min  Charges:  $Gait Training: 8-22 mins $Therapeutic Activity: 23-37 mins                     Verner Mould, DPT Physical Therapist with [facility] [phone]  12/17/2019 2:51 PM

## 2019-12-17 NOTE — Progress Notes (Signed)
Inpatient Rehabilitation Admissions Coordinator  Inpatient rehab consul received. Patient lacks the medical neccesity for an inpt rehab hospital admission. I recommend SNF rehab until able to get into an substance abuse program.   Danne Baxter, RN, MSN Rehab Admissions Coordinator [phone] 12/17/2019 9:58 PM

## 2019-12-17 NOTE — TOC Progression Note (Addendum)
Transition of Care [facility]) - Progression Note    Patient Details  Name: Danny Kerr MRN: [id number] Date of Birth: 1956/08/19  Transition of Care [facility]) CM/SW [place], [place] Phone Number: 12/17/2019, 10:13 AM  Clinical Narrative:  Spent significant time listening to patient.  "I want to stop drinking." "I'm scared I can't do it, and I will lose my family."  "I have not been buying into the programs that I have been at because they are not all alcoholics." "Coming out of [facility] was traumatic.  My house was gone. I was on my own." "My family would not see me on Christmas." He does not want to go back to [facility] rehab, but also does not have a plan in place as a substitute.  He did admit that his wife has better judgement about this than he does, and we know she wants him to go back to rehab.  Told him I would see about identifying any other IOPs as he does not want to return to [facility] and cannot go to [place]. List given.  Process is continuing to unfold. TOC will continue to follow during the course of hospitalization.      Expected Discharge Plan: (SA rehab v SNF) Barriers to Discharge: Other (comment)(Unknown disposition plan)  Expected Discharge Plan and Services Expected Discharge Plan: (SA rehab v SNF) In-house Referral: Clinical Social Work   Post Acute Care Choice: (SA rehab) Living arrangements for the past 2 months: No permanent address                                       Social Determinants of Health (SDOH) Interventions    Readmission Risk Interventions No flowsheet data found.

## 2019-12-17 NOTE — Progress Notes (Signed)
Occupational Therapy Treatment Patient Details Name: FRITZGERALD DECANDIA MRN: [id number] DOB: 10-23-1955 Today's Date: 12/17/2019    History of present illness Patient is 64 yo male with h/o alcoholism, last drink 2 days prior to admission , drinks about 1/5 Vodka a day. Patient states he was driving until 2 weeks ago and was [employer] as well.  He called EMS because he could not get up and walk. In the emergency room, patient was hemodynamically stable and afebrile.  Had uncontrolled blood press, alcohol level 49, shaky and tremulous and unsteady gait.  Admitted with benzodiazepine protocol.   OT comments  Patient has demonstrated steady progress toward acute care OT goals. Patient has progressed from max A x 2 to Minimal A x 1 with use of rolling walker for mobility around room. Patient was able to complete grooming task while standing at sink with SBA and extra time with opening grooming items. Patient was educated on safety awareness secondary to impulsiveness during mobility.  Patient was able to complete B UE therapeutic exercises in all planes with moderate resistance. Patient will benefit from continued skilled acute OT services.   Follow Up Recommendations  CIR    Equipment Recommendations  3 in 1 bedside commode    Recommendations for Other Services      Precautions / Restrictions Precautions Precautions: Fall Restrictions Weight Bearing Restrictions: No       Mobility Bed Mobility Overal bed mobility: Needs Assistance Bed Mobility: Supine to Sit     Supine to sit: Min guard Sit to supine: Min guard   General bed mobility comments: pt with improved awareness and able to sequence supine to sit transfer from flat bed without cues or assist.  Transfers Overall transfer level: Needs assistance Equipment used: 1 person hand held assist;None Transfers: Sit to/from Stand Sit to Stand: Min assist         General transfer comment: (demonstrating progress with sequencing  of movements)    Balance Overall balance assessment: Needs assistance Sitting-balance support: Bilateral upper extremity supported;Feet supported Sitting balance-Leahy Scale: Good     Standing balance support: Bilateral upper extremity supported;Single extremity supported;During functional activity Standing balance-Leahy Scale: Fair Standing balance comment: pt reliant on external support to prevent LOB Single Leg Stance - Right Leg: 0 Single Leg Stance - Left Leg: 0 Tandem Stance - Right Leg: 0 Tandem Stance - Left Leg: 0       High Level Balance Comments: pt required external support to obtain tandem stance position and was unable to hold without min assist from therapist           ADL either performed or assessed with clinical judgement   ADL       Grooming: Oral care;Wash/dry face;Wash/dry hands;Supervision/safety;Set up;Standing                               Functional mobility during ADLs: Minimal assistance;Rolling walker General ADL Comments: improving fine motor control to open grooming items      Vision       Perception     Praxis      Cognition Arousal/Alertness: Awake/alert Behavior During Therapy: WFL for tasks assessed/performed Overall Cognitive Status: Within Functional Limits for tasks assessed                                 General Comments: Pt with much improved  cognitive status, alert and oriented to situation. Pt with improved safety awareness and increased awareness of deficits. He is interested in possibly going to a SA center if he can get PT there as well. He is scared about possibly going home and being able to keep his sobriety. If he can't get PT at [facility] he would be open to SNF. As long term goal/plan he expressed interest in being able to go to [place] meeting 5x/week and participating in [place].        Exercises Exercises: General Upper Extremity General Exercises - Upper Extremity Shoulder Flexion: AROM;10  reps Shoulder Extension: AROM;10 reps Shoulder ABduction: AROM Shoulder ADduction: AROM Shoulder Horizontal ABduction: AROM Shoulder Horizontal ADduction: AROM Elbow Flexion: AROM Elbow Extension: AROM Other Exercises Other Exercises: 30 second chair rise test: pt performed 9 reps in 30 seconds, no UE use for power up however some momentum used.   Shoulder Instructions       General Comments pt wtih decreased coordination for bil LE with heel to shin and RAM's. Pt UE's more limited with coordination for RAM's and finger to nose.    Pertinent Vitals/ Pain       Pain Assessment: No/denies pain  Home Living   Living Arrangements: Alone   Type of Home: House(pt in AirBnB) Home Access: Stairs to enter Entrance Stairs-Number of Steps: 3 Entrance Stairs-Rails: Right Home Layout: Two level;Bed/bath upstairs(split level, bed upstairs) Alternate Level Stairs-Number of Steps: 5 Alternate Level Stairs-Rails: Right     Bathroom Toilet: Standard                Prior Functioning/Environment Level of Independence: Independent            Frequency           Progress Toward Goals  OT Goals(current goals can now be found in the care plan section)  Progress towards OT goals: Progressing toward goals  Acute Rehab OT Goals Patient Stated Goal: tog et back to walking and "beat this thing"  Plan Discharge plan remains appropriate    Co-evaluation                 AM-PAC OT "6 Clicks" Daily Activity     Outcome Measure   Help from another person eating meals?: A Little Help from another person taking care of personal grooming?: A Little Help from another person toileting, which includes using toliet, bedpan, or urinal?: A Little Help from another person bathing (including washing, rinsing, drying)?: A Little Help from another person to put on and taking off regular upper body clothing?: A Little Help from another person to put on and taking off regular lower  body clothing?: A Little 6 Click Score: 18    End of Session Equipment Utilized During Treatment: Rolling walker;Gait belt      Activity Tolerance Patient tolerated treatment well   Patient Left in bed;with call bell/phone within reach;with bed alarm set   Nurse Communication Mobility status        Time: [id number] OT Time Calculation (min): 23 min  Charges: OT General Charges $OT Visit: 1 Visit OT Treatments $Self Care/Home Management : 8-22 mins $Neuromuscular Re-education: 8-22 mins  Reynald Woods OTR/L    Costella Schwarz 12/17/2019, 3:42 PM

## 2019-12-17 NOTE — Progress Notes (Addendum)
PROGRESS NOTE  Danny Kerr [id number] DOB: [birth year] DOA: 12/12/2019 PCP: Velna Hatchet, MD   LOS: 5 days   Brief narrative: As per HPI,  64 yo male with history of alcoholism, last drink 2 days prior to admission , drinks about 1/5 Vodka a day. Last rehab 07/2019 at [facility] for one month and resorted back drinking.  His wife divorced with him that month.  Came back home and started living on air-BnB.  AA meetings are closed onsite.  Patient stated that he was driving until 2 weeks ago.  He called EMS because he could not get up and walk. In the emergency room, patient was hemodynamically stable and afebrile.  Had uncontrolled blood pressure, alcohol level 49, shaky and tremulous and unsteady gait.  Admitted to the hospital for alcohol withdrawal.  Assessment/Plan:  Active Problems:   Alcohol withdrawal ([place])  Alcoholism with alcohol use disorder, alcohol withdrawal. Denies any hallucinations today.  Wishes to quit alcohol.  Continue withdrawal precautions.  Continue IV fluids multivitamins.  On Librium and Atarax.  Clonidine as needed for high blood pressure.  Continue trazodone as needed sleep.  Continue Lexapro .  Patient will immensely benefit from alcohol rehab as well.  Unsteady gait/with alcohol induced peripheral neuropathy: B12 normal.    Main reason for presentation to hospital.  TSH normal.    RBC folate within normal limits.   25 hydroxy vitamin D level pending.  Seen by physical therapy who recommended skilled nursing facility placement for rehab.  Hypomagnesemia: Replaced and normalized.   Hypokalemia.  Replenished and improved.  Debility, deconditioning, difficulty with gait.  Physical therapy has seen the patient and recommended skilled nursing facility for rehab.  VTE Prophylaxis: Lovenox subcu  Code Status: Full code  Family Communication:  I spoke with the patient's spouse and updated her about the clinical condition of the patient and answered  all the queries she had.  Patient's spouse has the biggest concern regarding his ambulation gait and difficulty caring for himself.  Disposition Plan:  . Patient is from home . Likely disposition to substance abuse rehab/SNF, undetermined at this time . Medically stable for disposition . Barriers to discharge: pending disposition plan.  Consultants:  None  Procedures:  None  Antibiotics:  . None  Anti-infectives (From admission, onward)   None     Subjective: Today, patient was seen and examined at bedside.  Patient denies hallucinations today. Feels otherwise ok.  Objective: Vitals:   12/16/19 2059 12/17/19 0606  BP: 126/86 (!) 129/93  Pulse: 94 76  Resp: 20 19  Temp: 98.7 F (37.1 C) 98 F (36.7 C)  SpO2: 97% 99%    Intake/Output Summary (Last 24 hours) at 12/17/2019 1113 Last data filed at 12/17/2019 0500 Gross per 24 hour  Intake 944 ml  Output 1700 ml  Net -756 ml   Filed Weights   12/12/19 1358 12/12/19 2112  Weight: 99.8 kg 88.1 kg   Body mass index is 24.28 kg/m.   Physical Exam: GENERAL: Patient is alert awake and communicative not in obvious distress.  Oriented HENT: No scleral pallor or icterus. Pupils equally reactive to light. Oral mucosa is moist NECK: is supple, no gross swelling noted. CHEST: Clear to auscultation. No crackles or wheezes.  Diminished breath sounds bilaterally. CVS: S1 and S2 heard, no murmur. Regular rate and rhythm.  ABDOMEN: Soft, non-tender, bowel sounds are present. EXTREMITIES: No edema.  Right upper extremity tremor CNS: Cranial nerves are intact. No focal motor deficits.  Mild right hand tremor. SKIN: warm and dry without rashes.  Data Review: I have personally reviewed the following laboratory data and studies,  CBC: Recent Labs  Lab 12/12/19 1445 12/12/19 1445 12/13/19 0537 12/13/19 1148 12/14/19 0509 12/15/19 0445 12/16/19 0520 12/17/19 0511  WBC 5.5   < > 4.5  --  3.8* 5.3 6.9 4.4  NEUTROABS 4.8   --   --   --  2.4 3.6 4.8 2.1  HGB 15.0   < > 14.5  --  14.5 14.9 14.5 14.3  HCT 43.1   < > 42.3 43.1 42.2 44.0 43.7 41.6  MCV 94.1   < > 94.8  --  94.6 95.4 97.3 95.4  PLT 96*   < > 80*  --  71* 82* 111* 112*   < > = values in this interval not displayed.   Basic Metabolic Panel: Recent Labs  Lab 12/12/19 1445 12/13/19 0537 12/14/19 0509 12/15/19 0445 12/16/19 0520 12/17/19 0511  NA 138  --  135 138 137 135  K 3.7  --  3.3* 4.1 3.8 3.3*  CL 97*  --  101 104 103 100  CO2 20*  --  24 23 26 25   GLUCOSE 92  --  105* 104* 116* 99  BUN 12  --  7* 8 15 14   CREATININE 0.62  --  0.55* 0.57* 0.64 0.63  CALCIUM 9.0  --  9.0 9.4 9.4 9.0  MG 1.7 1.7 1.8 2.0  --  1.9  PHOS  --  3.1 3.6 2.6  --  4.6   Liver Function Tests: Recent Labs  Lab 12/12/19 1445 12/14/19 0509 12/15/19 0445 12/16/19 0520 12/17/19 0511  AST 101* 51* 60* 52* 49*  ALT 34 23 27 28 27   ALKPHOS 117 93 93 89 80  BILITOT 2.0* 2.1* 1.8* 1.5* 1.3*  PROT 7.6 6.5 6.6 6.8 6.7  ALBUMIN 4.4 3.7 3.9 3.7 3.7   No results for input(s): LIPASE, AMYLASE in the last 168 hours. No results for input(s): AMMONIA in the last 168 hours. Cardiac Enzymes: No results for input(s): CKTOTAL, CKMB, CKMBINDEX, TROPONINI in the last 168 hours. BNP (last 3 results) No results for input(s): BNP in the last 8760 hours.  ProBNP (last 3 results) No results for input(s): PROBNP in the last 8760 hours.  CBG: No results for input(s): GLUCAP in the last 168 hours. Recent Results (from the past 240 hour(s))  SARS CORONAVIRUS 2 (TAT 6-24 HRS) Nasopharyngeal Nasopharyngeal Swab     Status: None   Collection Time: 12/12/19  7:38 PM   Specimen: Nasopharyngeal Swab  Result Value Ref Range Status   SARS Coronavirus 2 NEGATIVE NEGATIVE Final    Comment: (NOTE) SARS-CoV-2 target nucleic acids are NOT DETECTED. The SARS-CoV-2 RNA is generally detectable in upper and lower respiratory specimens during the acute phase of infection.  Negative results do not preclude SARS-CoV-2 infection, do not rule out co-infections with other pathogens, and should not be used as the sole basis for treatment or other patient management decisions. Negative results must be combined with clinical observations, patient history, and epidemiological information. The expected result is Negative. Fact Sheet for Patients: SugarRoll.be Fact Sheet for Healthcare Providers: https://www.woods-mathews.com/ This test is not yet approved or cleared by the [place] FDA and  has been authorized for detection and/or diagnosis of SARS-CoV-2 by FDA under an Emergency Use Authorization (EUA). This EUA will remain  in effect (meaning this test can be used) for the duration of the  COVID-19 declaration under Section 56 4(b)(1) of the Act, 21 U.S.C. section 360bbb-3(b)(1), unless the authorization is terminated or revoked sooner. Performed at [facility], [place] [address], [place], [place] [address]      Studies: No results found.    Flora Lipps, MD  [facility] 12/17/2019

## 2019-12-18 LAB — CBC WITH DIFFERENTIAL/PLATELET
Abs Immature Granulocytes: 0.01 10*3/uL (ref 0.00–0.07)
Basophils Absolute: 0 10*3/uL (ref 0.0–0.1)
Basophils Relative: 1 %
Eosinophils Absolute: 0.2 10*3/uL (ref 0.0–0.5)
Eosinophils Relative: 3 %
HCT: 40.2 % (ref 39.0–52.0)
Hemoglobin: 13.8 g/dL (ref 13.0–17.0)
Immature Granulocytes: 0 %
Lymphocytes Relative: 26 %
Lymphs Abs: 1.2 10*3/uL (ref 0.7–4.0)
MCH: 32.9 pg (ref 26.0–34.0)
MCHC: 34.3 g/dL (ref 30.0–36.0)
MCV: 95.7 fL (ref 80.0–100.0)
Monocytes Absolute: 1 10*3/uL (ref 0.1–1.0)
Monocytes Relative: 23 %
Neutro Abs: 2.1 10*3/uL (ref 1.7–7.7)
Neutrophils Relative %: 47 %
Platelets: 140 10*3/uL — ABNORMAL LOW (ref 150–400)
RBC: 4.2 MIL/uL — ABNORMAL LOW (ref 4.22–5.81)
RDW: 14.3 % (ref 11.5–15.5)
WBC: 4.4 10*3/uL (ref 4.0–10.5)
nRBC: 0 % (ref 0.0–0.2)

## 2019-12-18 LAB — BASIC METABOLIC PANEL
Anion gap: 10 (ref 5–15)
BUN: 9 mg/dL (ref 8–23)
CO2: 27 mmol/L (ref 22–32)
Calcium: 9.2 mg/dL (ref 8.9–10.3)
Chloride: 99 mmol/L (ref 98–111)
Creatinine, Ser: 0.68 mg/dL (ref 0.61–1.24)
GFR calc Af Amer: >60 mL/min (ref >60)
GFR calc non Af Amer: >60 mL/min (ref >60)
Glucose, Bld: 96 mg/dL (ref 70–99)
Potassium: 3.7 mmol/L (ref 3.5–5.1)
Sodium: 136 mmol/L (ref 135–145)

## 2019-12-18 MED ORDER — VITAMIN D (ERGOCALCIFEROL) 1.25 MG (50000 UNIT) PO CAPS
50000.0000 [IU] | ORAL_CAPSULE | ORAL | Status: DC
  Administered 2019-12-19: 09:00:00 50000 [IU] via ORAL
  Filled 2019-12-18: qty 1

## 2019-12-18 NOTE — Progress Notes (Signed)
Physical Therapy Treatment Patient Details Name: Danny Kerr MRN: [id number] DOB: Jul 21, 1956 Today's Date: 12/18/2019    History of Present Illness Patient is 64 yo male with h/o alcoholism, last drink 2 days prior to admission , drinks about 1/5 Vodka a day. Patient states he was driving until 2 weeks ago and was [employer] as well.  He called EMS because he could not get up and walk. In the emergency room, patient was hemodynamically stable and afebrile.  Had uncontrolled blood press, alcohol level 49, shaky and tremulous and unsteady gait.  Admitted with benzodiazepine protocol.    PT Comments    Patient is progressing steadily with physical therapy and was able to complete transfers and gait and min guard/assist level. Pt continues to require verbal cues for safe use of RW due to decreased knowledge of RW use. He was able to ambulate 180' with no UE support and min guard assist, no overt LOB however BOS remains narrow during gait and unsteady. Pt has verbalized understanding of balance/gait impairments and fall risk however continues to be quick to mobilize requiring cues for safety. Patient expressed concerns in regards to discharge plan and rehabilitation plan for mobility and alcohol abuse. He will benefit from ongoing skilled PT interventions to address impairments and progress towards PLOF with mobility.    Follow Up Recommendations  Supervision/Assistance - 24 hour;SNF(pt considering SA center, would need HHPT at center if able)     Equipment Recommendations       Recommendations for Other Services       Precautions / Restrictions Precautions Precautions: Fall Restrictions Weight Bearing Restrictions: No    Mobility  Bed Mobility Overal bed mobility: Needs Assistance Bed Mobility: Supine to Sit;Sit to Supine     Supine to sit: Modified independent (Device/Increase time) Sit to supine: Modified independent (Device/Increase time)   General bed mobility comments:  increased time  Transfers Overall transfer level: Needs assistance Equipment used: Rolling walker (2 wheeled);None Transfers: Sit to/from Stand Sit to Stand: Min assist;Min guard         General transfer comment: min assist to steady with rising to RW, cues required for safe hand placement with power up  Ambulation/Gait Ambulation/Gait assistance: Min guard;Min assist Gait Distance (Feet): 720 Feet Assistive device: Rolling walker (2 wheeled);1 person hand held assist;None Gait Pattern/deviations: Step-through pattern;Drifts right/left;Narrow base of support Gait velocity: fair, progressing towards normal gait speed   General Gait Details: pt performed 360' with RW for gait, min assist required intermittently and verbal cues for safe proximity to RW. pt able to follow verbal/visual cues to maintain a straight line with RW. Pt performed ~ 180' with 1HHA and 180' with no UE support. He required min guard to min assist to steady during gait. pt had normal arm swing to assist with balance but BOS remains narrow. pt demonstrated poor safety awareness with gait in hospital room to negotiate bed/obstacles.   Stairs             Wheelchair Mobility    Modified Rankin (Stroke Patients Only)       Balance Overall balance assessment: Needs assistance Sitting-balance support: Bilateral upper extremity supported;Feet supported Sitting balance-Leahy Scale: Good       Standing balance-Leahy Scale: Good         Cognition Arousal/Alertness: Awake/alert Behavior During Therapy: WFL for tasks assessed/performed Overall Cognitive Status: Within Functional Limits for tasks assessed Area of Impairment: Safety/judgement          Safety/Judgement: Decreased  awareness of safety     General Comments: pt remains impulsive with transfers and gait demonstrating poor safety awareness despite verbalizing and awareness of deficits.      Exercises      General Comments General  comments (skin integrity, edema, etc.): pt expressed feelings of frustration, anxiety, and tiredness as he is uncertain of what to do to recover safely. He states he does not think any alcohol rehab facility will take him and he is not very intereted in a SNF however thinks it may be his only option other than returning home.      Pertinent Vitals/Pain Pain Assessment: No/denies pain           PT Goals (current goals can now be found in the care plan section) Acute Rehab PT Goals Patient Stated Goal: to get back to walking and "beat this thing" PT Goal Formulation: With patient Time For Goal Achievement: 12/29/19 Potential to Achieve Goals: Good Progress towards PT goals: Progressing toward goals    Frequency    Min 3X/week      PT Plan Current plan remains appropriate       AM-PAC PT "6 Clicks" Mobility   Outcome Measure  Help needed turning from your back to your side while in a flat bed without using bedrails?: None Help needed moving from lying on your back to sitting on the side of a flat bed without using bedrails?: None Help needed moving to and from a bed to a chair (including a wheelchair)?: A Little Help needed standing up from a chair using your arms (e.g., wheelchair or bedside chair)?: None Help needed to walk in hospital room?: A Little Help needed climbing 3-5 steps with a railing? : A Little 6 Click Score: 21    End of Session Equipment Utilized During Treatment: Gait belt Activity Tolerance: Patient tolerated treatment well Patient left: in bed;with bed alarm set;with call bell/phone within reach Nurse Communication: Mobility status PT Visit Diagnosis: Difficulty in walking, not elsewhere classified (R26.2);Unsteadiness on feet (R26.81);Muscle weakness (generalized) (M62.81)     Time: [id number] PT Time Calculation (min) (ACUTE ONLY): 27 min  Charges:  $Gait Training: 23-37 mins                     Verner Mould, DPT Physical Therapist with [facility] [phone]  12/18/2019 4:33 PM

## 2019-12-18 NOTE — Progress Notes (Signed)
PROGRESS NOTE  Danny Kerr [id number] DOB: [birth year] DOA: 12/12/2019 PCP: Velna Hatchet, MD   LOS: 6 days   Brief narrative: As per HPI,  64 yo male with history of alcoholism, last drink 2 days prior to admission , drinks about 1/5 Vodka a day. Last rehab 07/2019 at [facility] for one month and resorted back drinking.  His wife divorced with him that month.  Came back home and started living on air-BnB.  AA meetings are closed onsite.  Patient stated that he was driving until 2 weeks ago.  He called EMS because he could not get up and walk. In the emergency room, patient was hemodynamically stable and afebrile.  Had uncontrolled blood pressure, alcohol level 49, shaky and tremulous and unsteady gait.  Admitted to the hospital for alcohol withdrawal.  Assessment/Plan:  Active Problems:   Alcohol withdrawal ([facility])  Alcohol use disorder with alcohol withdrawal. Patient received Librium and Atarax..  No visual hallucinations today.  Feels better.  Continue trazodone Lexapro.   Unsteady gait/with alcohol induced peripheral neuropathy: B12 ,TSH normal.    RBC folate within normal limits.   25 hydroxy vitamin D at 26.  We will give vitamin D 2000 unit today.  Continue weekly on discharge.  Seen by physical therapy who recommended skilled nursing facility placement for rehab.  Hypomagnesemia: Replaced and normalized.  Magnesium 1.9.  Hypokalemia.  We will continue to replenish.  Potassium of 3.3 today.  Debility, deconditioning, difficulty with gait.  Physical therapy has seen the patient and recommended skilled nursing facility for rehab.  VTE Prophylaxis: Lovenox subcu  Code Status: Full code  Family Communication: None today.  Spoke with the patient as well as spouse yesterday.  Patient's spouse has the biggest concern regarding his ambulation gait and difficulty caring for himself.  Disposition Plan:  . Patient is from home . Likely disposition to substance abuse  rehab/SNF, undetermined at this time . Medically stable for disposition . Barriers to discharge: pending disposition plan.  Consultants:  None  Procedures:  None  Antibiotics:  . None  Anti-infectives (From admission, onward)   None     Subjective: Today, patient feels better.  Denies any dizziness, nausea, hallucinations, tremors.  Objective: Vitals:   12/17/19 2049 12/18/19 0627  BP: (!) 144/96 123/78  Pulse: 83 68  Resp: 16 16  Temp: 97.7 F (36.5 C) 98.1 F (36.7 C)  SpO2: 98% 99%    Intake/Output Summary (Last 24 hours) at 12/18/2019 1347 Last data filed at 12/17/2019 1745 Gross per 24 hour  Intake 360 ml  Output --  Net 360 ml   Filed Weights   12/12/19 1358 12/12/19 2112  Weight: 99.8 kg 88.1 kg   Body mass index is 24.28 kg/m.   Physical Exam: GENERAL: Patient is alert awake and oriented.  Not in obvious distress.   HENT: No scleral pallor or icterus. Pupils equally reactive to light. Oral mucosa is moist NECK: is supple, no gross swelling noted. CHEST: Clear to auscultation. No crackles or wheezes.  Diminished breath sounds bilaterally. CVS: S1 and S2 heard, no murmur. Regular rate and rhythm.  ABDOMEN: Soft, non-tender, bowel sounds are present. EXTREMITIES: No edema. CNS: Cranial nerves are intact. No focal motor deficits.  Mild right hand tremor. SKIN: warm and dry without rashes.  Data Review: I have personally reviewed the following laboratory data and studies,  CBC: Recent Labs  Lab 12/14/19 0509 12/15/19 0445 12/16/19 0520 12/17/19 0511 12/18/19 0456  WBC 3.8* 5.3  6.9 4.4 4.4  NEUTROABS 2.4 3.6 4.8 2.1 2.1  HGB 14.5 14.9 14.5 14.3 13.8  HCT 42.2 44.0 43.7 41.6 40.2  MCV 94.6 95.4 97.3 95.4 95.7  PLT 71* 82* 111* 112* [id number]*   Basic Metabolic Panel: Recent Labs  Lab 12/12/19 1445 12/12/19 1445 12/13/19 0537 12/14/19 0509 12/15/19 0445 12/16/19 0520 12/17/19 0511 12/18/19 0456  NA 138   < >  --  135 138 137 135 136  K  3.7   < >  --  3.3* 4.1 3.8 3.3* 3.7  CL 97*   < >  --  101 104 103 100 99  CO2 20*   < >  --  24 23 26 25 27   GLUCOSE 92   < >  --  105* 104* 116* 99 96  BUN 12   < >  --  7* 8 15 14 9   CREATININE 0.62   < >  --  0.55* 0.57* 0.64 0.63 0.68  CALCIUM 9.0   < >  --  9.0 9.4 9.4 9.0 9.2  MG 1.7  --  1.7 1.8 2.0  --  1.9  --   PHOS  --   --  3.1 3.6 2.6  --  4.6  --    < > = values in this interval not displayed.   Liver Function Tests: Recent Labs  Lab 12/12/19 1445 12/14/19 0509 12/15/19 0445 12/16/19 0520 12/17/19 0511  AST 101* 51* 60* 52* 49*  ALT 34 23 27 28 27   ALKPHOS 117 93 93 89 80  BILITOT 2.0* 2.1* 1.8* 1.5* 1.3*  PROT 7.6 6.5 6.6 6.8 6.7  ALBUMIN 4.4 3.7 3.9 3.7 3.7   No results for input(s): LIPASE, AMYLASE in the last 168 hours. No results for input(s): AMMONIA in the last 168 hours. Cardiac Enzymes: No results for input(s): CKTOTAL, CKMB, CKMBINDEX, TROPONINI in the last 168 hours. BNP (last 3 results) No results for input(s): BNP in the last 8760 hours.  ProBNP (last 3 results) No results for input(s): PROBNP in the last 8760 hours.  CBG: No results for input(s): GLUCAP in the last 168 hours. Recent Results (from the past 240 hour(s))  SARS CORONAVIRUS 2 (TAT 6-24 HRS) Nasopharyngeal Nasopharyngeal Swab     Status: None   Collection Time: 12/12/19  7:38 PM   Specimen: Nasopharyngeal Swab  Result Value Ref Range Status   SARS Coronavirus 2 NEGATIVE NEGATIVE Final    Comment: (NOTE) SARS-CoV-2 target nucleic acids are NOT DETECTED. The SARS-CoV-2 RNA is generally detectable in upper and lower respiratory specimens during the acute phase of infection. Negative results do not preclude SARS-CoV-2 infection, do not rule out co-infections with other pathogens, and should not be used as the sole basis for treatment or other patient management decisions. Negative results must be combined with clinical observations, patient history, and epidemiological  information. The expected result is Negative. Fact Sheet for Patients: SugarRoll.be Fact Sheet for Healthcare Providers: https://www.woods-mathews.com/ This test is not yet approved or cleared by the [place] FDA and  has been authorized for detection and/or diagnosis of SARS-CoV-2 by FDA under an Emergency Use Authorization (EUA). This EUA will remain  in effect (meaning this test can be used) for the duration of the COVID-19 declaration under Section 56 4(b)(1) of the Act, 21 U.S.C. section 360bbb-3(b)(1), unless the authorization is terminated or revoked sooner. Performed at [facility], [place] [address] [place],  [address]      Studies:  No results found.    Flora Lipps, MD  [facility] 12/18/2019

## 2019-12-18 NOTE — Plan of Care (Signed)

## 2019-12-18 NOTE — TOC Progression Note (Signed)
Transition of Care [facility]) - Progression Note    Patient Details  Name: Danny Kerr MRN: [id number] Date of Birth: [birth year]  Transition of Care [facility]) CM/SW [place], [place] Phone Number: 12/18/2019, 3:36 PM  Clinical Narrative:  Patient continues to be ambivalent about sobriety plan; states he does not want to go to SNF.  Called wife who is focused on SA rehab.  She would like to use [place] only as last resort.  Prefers [place] [place]. Sent updated clinicals. She asked about [facility].  They only [facility].  Sent referral to [facility] in [place], [place]. Told wife the plan is to d/c tomorrow.  She voiced understanding. TOC will continue to follow during the course of hospitalization.      Expected Discharge Plan: (SA rehab) Barriers to Discharge: Other (comment)(SA rehab pending family decision)  Expected Discharge Plan and Services Expected Discharge Plan: (SA rehab) In-house Referral: Clinical Social Work   Post Acute Care Choice: (SA rehab) Living arrangements for the past 2 months: No permanent address                                       Social Determinants of Health (SDOH) Interventions    Readmission Risk Interventions No flowsheet data found.

## 2019-12-19 LAB — CBC WITH DIFFERENTIAL/PLATELET
Abs Immature Granulocytes: 0.01 10*3/uL (ref 0.00–0.07)
Basophils Absolute: 0.1 10*3/uL (ref 0.0–0.1)
Basophils Relative: 1 %
Eosinophils Absolute: 0.2 10*3/uL (ref 0.0–0.5)
Eosinophils Relative: 4 %
HCT: 40.6 % (ref 39.0–52.0)
Hemoglobin: 13.9 g/dL (ref 13.0–17.0)
Immature Granulocytes: 0 %
Lymphocytes Relative: 26 %
Lymphs Abs: 1.1 10*3/uL (ref 0.7–4.0)
MCH: 32.8 pg (ref 26.0–34.0)
MCHC: 34.2 g/dL (ref 30.0–36.0)
MCV: 95.8 fL (ref 80.0–100.0)
Monocytes Absolute: 1.2 10*3/uL — ABNORMAL HIGH (ref 0.1–1.0)
Monocytes Relative: 28 %
Neutro Abs: 1.6 10*3/uL — ABNORMAL LOW (ref 1.7–7.7)
Neutrophils Relative %: 41 %
Platelets: 164 10*3/uL (ref 150–400)
RBC: 4.24 MIL/uL (ref 4.22–5.81)
RDW: 14.5 % (ref 11.5–15.5)
WBC: 4.1 10*3/uL (ref 4.0–10.5)
nRBC: 0 % (ref 0.0–0.2)

## 2019-12-19 LAB — BASIC METABOLIC PANEL
Anion gap: 7 (ref 5–15)
BUN: 8 mg/dL (ref 8–23)
CO2: 28 mmol/L (ref 22–32)
Calcium: 9.4 mg/dL (ref 8.9–10.3)
Chloride: 103 mmol/L (ref 98–111)
Creatinine, Ser: 0.72 mg/dL (ref 0.61–1.24)
GFR calc Af Amer: >60 mL/min (ref >60)
GFR calc non Af Amer: >60 mL/min (ref >60)
Glucose, Bld: 102 mg/dL — ABNORMAL HIGH (ref 70–99)
Potassium: 4.5 mmol/L (ref 3.5–5.1)
Sodium: 138 mmol/L (ref 135–145)

## 2019-12-19 MED ORDER — FOLIC ACID 1 MG PO TABS: 1.0000 mg | ORAL_TABLET | Freq: Every day | ORAL | 0 refills | Status: DC

## 2019-12-19 NOTE — Progress Notes (Addendum)
CSW met with pt to offer resources, initally pt refused but as session progressed, pt accepted education on meeting for area Alcoholics Anonymous 12-Step meetings as well as inpatient/outpatient SA Tx resources.  CSW provided active listening and employed motivational interviewing.  CSW also provided education to the pt as to the efficacy of 12-step programs for community support for those needing support in addition to or other than inpatient/outpatient treatment. Pt appreciated CSW's efforts and thanked the CSW.  Pt at this time declined CSW reachoing out to [facility] or any other SA TX facilities.  Pt stated he will attend 12-step meetings in the area and seek admission to [facility]'s outpatient program.  EDP/TOC RN CM updated.   F. , LCSW, LCAS, CSI Clinical Social Worker Ph: [phone]   

## 2019-12-19 NOTE — Progress Notes (Signed)
Physical Therapy Treatment Patient Details Name: Danny Kerr MRN: [id number] DOB: 09-Apr-1956 Today's Date: 12/19/2019    History of Present Illness Patient is 64 yo male with h/o alcoholism, last drink 2 days prior to admission , drinks about 1/5 Vodka a day. Patient states he was driving until 2 weeks ago and was [employer] as well.  He called EMS because he could not get up and walk. In the emergency room, patient was hemodynamically stable and afebrile.  Had uncontrolled blood press, alcohol level 49, shaky and tremulous and unsteady gait.  Admitted with benzodiazepine protocol.    PT Comments    Pt ambulated 380' without an assistive device with no loss of balance. Pt had no loss of balance with balance challenges (head turns while walking, standing with feet together, reaching to floor). He is independent with mobility and safe to ambulate independently in halls without assistance. PT goals met, no further PT indicated. Will sign off.   Follow Up Recommendations  No PT follow up     Equipment Recommendations  None recommended by PT    Recommendations for Other Services       Precautions / Restrictions Precautions Precautions: Fall Restrictions Weight Bearing Restrictions: No    Mobility  Bed Mobility Overal bed mobility: Independent Bed Mobility: Supine to Sit;Sit to Supine     Supine to sit: Independent Sit to supine: Independent      Transfers Overall transfer level: Needs assistance Equipment used: None Transfers: Sit to/from Stand Sit to Stand: Independent         General transfer comment: steady, no loss of balance  Ambulation/Gait Ambulation/Gait assistance: Independent Gait Distance (Feet): 380 Feet Assistive device: None Gait Pattern/deviations: WFL(Within Functional Limits) Gait velocity: WNL   General Gait Details: steady, no drifting, no loss of balance with head turns, able to stop and start quickly on command   Stairs              Wheelchair Mobility    Modified Rankin (Stroke Patients Only)       Balance Overall balance assessment: Independent   Sitting balance-Leahy Scale: Normal       Standing balance-Leahy Scale: Normal Standing balance comment: able to stand with feet together for 10 seconds without LOB, can reach to floor without loss of balance, no loss of balance with ambulation                            Cognition Arousal/Alertness: Awake/alert Behavior During Therapy: WFL for tasks assessed/performed Overall Cognitive Status: Within Functional Limits for tasks assessed                                        Exercises      General Comments        Pertinent Vitals/Pain Pain Assessment: No/denies pain    Home Living                      Prior Function            PT Goals (current goals can now be found in the care plan section) Acute Rehab PT Goals Patient Stated Goal: to get back to walking and "beat this thing" PT Goal Formulation: With patient Time For Goal Achievement: 12/29/19 Potential to Achieve Goals: Good Progress towards PT goals: Goals met/education completed, patient  discharged from PT    Frequency    Min 3X/week      PT Plan Discharge plan needs to be updated    Co-evaluation              AM-PAC PT "6 Clicks" Mobility   Outcome Measure  Help needed turning from your back to your side while in a flat bed without using bedrails?: None Help needed moving from lying on your back to sitting on the side of a flat bed without using bedrails?: None Help needed moving to and from a bed to a chair (including a wheelchair)?: None Help needed standing up from a chair using your arms (e.g., wheelchair or bedside chair)?: None Help needed to walk in hospital room?: None Help needed climbing 3-5 steps with a railing? : None 6 Click Score: 24    End of Session Equipment Utilized During Treatment: Gait belt Activity  Tolerance: Patient tolerated treatment well Patient left: in bed;with call bell/phone within reach Nurse Communication: Mobility status PT Visit Diagnosis: Difficulty in walking, not elsewhere classified (R26.2)     Time: 1050-1102 PT Time Calculation (min) (ACUTE ONLY): 12 min  Charges:  $Gait Training: 8-22 mins                    Blondell Reveal Kistler PT 12/19/2019  Acute Rehabilitation Services Pager [phone] Office [phone]

## 2019-12-19 NOTE — TOC Progression Note (Signed)
Transition of Care [facility]) - Progression Note    Patient Details  Name: Danny Kerr MRN: [id number] Date of Birth: April 29, 1956  Transition of Care [facility]) CM/SW Contact  Joaquin Courts, RN Phone Number: 12/19/2019, 2:52 PM  Clinical Narrative:    Patient set up with [facility] home care for HHPT/OT/aide/social work/ nurse.     Expected Discharge Plan: [place] Barriers to Discharge: No Barriers Identified  Expected Discharge Plan and Services Expected Discharge Plan: [place] In-house Referral: Clinical Social Work Discharge Planning Services: CM Consult Post Acute Care Choice: (SA rehab) Living arrangements for the past 2 months: Apartment Expected Discharge Date: 12/19/19               DME Arranged: N/A DME Agency: NA       HH Arranged: PT, OT, RN, Nurse's Aide, Social Work [employer] Agency: [place] Date [place]: 12/19/19 Time [place]: 1452 Representative spoke with at [place]: [place]) Interventions    Readmission Risk Interventions No flowsheet data found.

## 2019-12-19 NOTE — Final Progress Note (Signed)
Patient remained free of falls this shift. A&O x4. Denies pain. Patient very steady on his feet this shift. Patient cleared for discharge. Provided and went over discharge paperwork with patient. No questions or concerns. Removed IV. Patient picked up by wife.

## 2019-12-19 NOTE — Progress Notes (Addendum)
CSW was alerted by the [place] Supervisor that pt was seen by PT and although a recommendation was made (see below) and SNF was part of the recommendation, pt ambulated 720 feet which makes insurance authorization for SNF rehab days so unlikely that finding a facility willing to spend the time even request an authorization would be difficult.   "Supervision/Assistance - 24 hour;SNF(pt considering SA center, would need HHPT at center if able).  Such a referral would be treated with disbelief by insurances, per CSW's contact in the skilled community, Ebony Hail in admissions at [facility] and [place])   [place] called pt who initially asked to D/C home and return to the hospital on Monday 2/29 to participate in PT with CIR.  When told this is an unlikely option pt then stated he feels "fine", his wife "is a Marine scientist", and that pt is going to go home and is requesting for [facility] to be set up so pt can discharge.  When asked pt states he is not willing to go to [facility] as, "I have been there before and they have the same old script".  Pt then states he ws not interested in any other facilities for [place].  Pt stated his plan was to return home and participate in PT with [facility] services.  Provider alerted via Amion and requested [place] orders for  face-to-face and RN/Aide/PT/OT and Social Work.  CSW will attempt to speak to pt at bedside to see if pt will consider SA TX prior to returning home.   CSW will continue to follow for D/C needs.  Alphonse Guild. Yehuda Printup, LCSW, LCAS, CSI Transitions of Care Clinical Social Worker Care Coordination Department Ph: [phone]

## 2019-12-19 NOTE — Discharge Summary (Signed)
Physician Discharge Summary  Danny Kerr [id number] DOB: Nov 19, 1955 DOA: 12/12/2019  PCP: Velna Hatchet, MD  Admit date: 12/12/2019 Discharge date: 12/19/2019  Admitted From: Home  Discharge disposition: Home with Home Health  Recommendations for Outpatient Follow-Up:   . Follow up with your primary care provider in one week.  . Check CBC, BMP, Mg in the next visit  Discharge Diagnosis:   Active Problems:   Alcohol withdrawal ([place])  Discharge Condition: Improved.  Diet recommendation:  Regular.  Wound care: None.  Code status: Full.   History of Present Illness:   64 yo male with history of alcoholism, last drink 2 days prior to admission , drinks about 1/5 Vodka a day. Last rehab 07/2019 at [facility] for one month and resorted back drinking. His wife divorced with him that month. Came back home and started living on air-BnB. AA meetings are closed onsite. Patient stated that he was driving until 2 weeks ago. He called EMS because he could not get up and walk. In the emergency room, patient was hemodynamically stable and afebrile. Had uncontrolled blood pressure, alcohol level 49, shaky and tremulous and unsteady gait. Admitted to the hospital for alcohol withdrawal.   Hospital Course:   Following conditions were addressed during hospitalization as listed below,  Alcohol use disorder with alcohol withdrawal. Patient received Librium and Atarax..  No visual hallucinations today.  Feels better.  Continue trazodone Lexapro on discharge.  Patient was advised to follow-up as outpatient for substance abuse program.  Unsteady gait/with alcohol induced peripheral neuropathy: B12 ,TSH normal.   RBC folate within normal limits.  25 hydroxy vitamin D at 26.  Patient will be continued on vitamin D supplements on discharge.   Patient was Seen by physical therapy who recommended skilled nursing facility placement for rehab.  Subsequently the plan was to have  home health PT.  Hypomagnesemia/Hypokalemia.    Improved with replacement.   Disposition.  At this time, patient is stable for disposition home with home health RN PT, home health aide and social worker.  Medical Consultants:    None.  Procedures:    None Subjective:   Today, patient feels okay.  Denies interval complaints.  No hallucinations tremors.  Wishes to go home.  Discharge Exam:   Vitals:   12/18/19 2034 12/19/19 0608  BP: 117/79 114/73  Pulse: 81 75  Resp: 16 16  Temp: (!) 97.1 F (36.2 C) (!) 97.1 F (36.2 C)  SpO2: 99% 100%   Vitals:   12/18/19 0627 12/18/19 1407 12/18/19 2034 12/19/19 0608  BP: 123/78 130/85 117/79 114/73  Pulse: 68 80 81 75  Resp: 16 18 16 16   Temp: 98.1 F (36.7 C) 98.5 F (36.9 C) (!) 97.1 F (36.2 C) (!) 97.1 F (36.2 C)  TempSrc:  Oral Oral Oral  SpO2: 99% 99% 99% 100%  Weight:      Height:       General: Alert awake, not in obvious distress HENT: pupils equally reacting to light,  No scleral pallor or icterus noted. Oral mucosa is moist.  Chest:  Clear breath sounds.  Diminished breath sounds bilaterally. No crackles or wheezes.  CVS: S1 &S2 heard. No murmur.  Regular rate and rhythm. Abdomen: Soft, nontender, nondistended.  Bowel sounds are heard.   Extremities: No cyanosis, clubbing or edema.  Peripheral pulses are palpable. Psych: Alert, awake and oriented, normal mood CNS:  No cranial nerve deficits.  Power equal in all extremities.  Mild hand tremor Skin:  Warm and dry.  No rashes noted.  The results of significant diagnostics from this hospitalization (including imaging, microbiology, ancillary and laboratory) are listed below for reference.     Diagnostic Studies:   No results found.   Labs:   Basic Metabolic Panel: Recent Labs  Lab 12/12/19 1445 12/12/19 1445 12/13/19 [id number] 12/14/19 0509 12/14/19 0509 12/15/19 0445 12/15/19 0445 12/16/19 0520 12/16/19 0520 12/17/19 0511 12/17/19 0511  12/18/19 0456 12/19/19 0545  NA 138   < >  --  135   < > 138  --  137  --  135  --  136 138  K 3.7   < >  --  3.3*   < > 4.1   < > 3.8   < > 3.3*   < > 3.7 4.5  CL 97*   < >  --  101   < > 104  --  103  --  100  --  99 103  CO2 20*   < >  --  24   < > 23  --  26  --  25  --  27 28  GLUCOSE 92   < >  --  105*   < > 104*  --  116*  --  99  --  96 102*  BUN 12   < >  --  7*   < > 8  --  15  --  14  --  9 8  CREATININE 0.62   < >  --  0.55*   < > 0.57*  --  0.64  --  0.63  --  0.68 0.72  CALCIUM 9.0   < >  --  9.0   < > 9.4  --  9.4  --  9.0  --  9.2 9.4  MG 1.7  --  1.7 1.8  --  2.0  --   --   --  1.9  --   --   --   PHOS  --   --  3.1 3.6  --  2.6  --   --   --  4.6  --   --   --    < > = values in this interval not displayed.   GFR Estimated Creatinine Clearance: 111.5 mL/min (by C-G formula based on SCr of 0.72 mg/dL). Liver Function Tests: Recent Labs  Lab 12/12/19 1445 12/14/19 0509 12/15/19 0445 12/16/19 0520 12/17/19 0511  AST 101* 51* 60* 52* 49*  ALT 34 23 27 28 27   ALKPHOS 117 93 93 89 80  BILITOT 2.0* 2.1* 1.8* 1.5* 1.3*  PROT 7.6 6.5 6.6 6.8 6.7  ALBUMIN 4.4 3.7 3.9 3.7 3.7   No results for input(s): LIPASE, AMYLASE in the last 168 hours. No results for input(s): AMMONIA in the last 168 hours. Coagulation profile Recent Labs  Lab 12/14/19 0509  INR 1.0    CBC: Recent Labs  Lab 12/15/19 0445 12/16/19 0520 12/17/19 0511 12/18/19 0456 12/19/19 0545  WBC 5.3 6.9 4.4 4.4 4.1  NEUTROABS 3.6 4.8 2.1 2.1 1.6*  HGB 14.9 14.5 14.3 13.8 13.9  HCT 44.0 43.7 41.6 40.2 40.6  MCV 95.4 97.3 95.4 95.7 95.8  PLT 82* 111* 112* 140* 164   Cardiac Enzymes: No results for input(s): CKTOTAL, CKMB, CKMBINDEX, TROPONINI in the last 168 hours. BNP: Invalid input(s): POCBNP CBG: No results for input(s): GLUCAP in the last 168 hours. D-Dimer No results for input(s): DDIMER in the last 72 hours.  Hgb A1c No results for input(s): HGBA1C in the last 72 hours. Lipid  Profile No results for input(s): CHOL, HDL, LDLCALC, TRIG, CHOLHDL, LDLDIRECT in the last 72 hours. Thyroid function studies No results for input(s): TSH, T4TOTAL, T3FREE, THYROIDAB in the last 72 hours.  Invalid input(s): FREET3 Anemia work up No results for input(s): VITAMINB12, FOLATE, FERRITIN, TIBC, IRON, RETICCTPCT in the last 72 hours. Microbiology Recent Results (from the past 240 hour(s))  SARS CORONAVIRUS 2 (TAT 6-24 HRS) Nasopharyngeal Nasopharyngeal Swab     Status: None   Collection Time: 12/12/19  7:38 PM   Specimen: Nasopharyngeal Swab  Result Value Ref Range Status   SARS Coronavirus 2 NEGATIVE NEGATIVE Final    Comment: (NOTE) SARS-CoV-2 target nucleic acids are NOT DETECTED. The SARS-CoV-2 RNA is generally detectable in upper and lower respiratory specimens during the acute phase of infection. Negative results do not preclude SARS-CoV-2 infection, do not rule out co-infections with other pathogens, and should not be used as the sole basis for treatment or other patient management decisions. Negative results must be combined with clinical observations, patient history, and epidemiological information. The expected result is Negative. Fact Sheet for Patients: SugarRoll.be Fact Sheet for Healthcare Providers: https://www.woods-mathews.com/ This test is not yet approved or cleared by the [place] FDA and  has been authorized for detection and/or diagnosis of SARS-CoV-2 by FDA under an Emergency Use Authorization (EUA). This EUA will remain  in effect (meaning this test can be used) for the duration of the COVID-19 declaration under Section 56 4(b)(1) of the Act, 21 U.S.C. section 360bbb-3(b)(1), unless the authorization is terminated or revoked sooner. Performed at [facility], [place] [address], [place], [place] [address]      Discharge Instructions:   Discharge Instructions    Diet general   Complete by: As  directed    Discharge instructions   Complete by: As directed    Please follow-up with your primary care physician in 1 week.  Please do not drink alcohol.   Increase activity slowly   Complete by: As directed      Allergies as of 12/19/2019   No Known Allergies     Medication List    STOP taking these medications   baclofen 10 MG tablet Commonly known as: LIORESAL   doxycycline 100 MG tablet Commonly known as: VIBRA-TABS   doxycycline 100 MG tablet Commonly known as: ADOXA     TAKE these medications   Azelaic Acid 15 % cream Apply 1 application topically 2 (two) times daily.   ergocalciferol 1.25 MG (50000 UT) capsule Commonly known as: VITAMIN D2 Take 50,000 Units by mouth once a week.   escitalopram 10 MG tablet Commonly known as: LEXAPRO Take 10 mg by mouth daily.   folic acid 1 MG tablet Commonly known as: FOLVITE Take 1 tablet (1 mg total) by mouth daily. Start taking on: December 20, 2019   ondansetron 4 MG disintegrating tablet Commonly known as: Zofran ODT Take 1 tablet (4 mg total) by mouth every 8 (eight) hours as needed for nausea or vomiting.   QC Multi-Vite 50 & Over Tabs TAKE ONE TABLET EVERY DAY   thiamine 100 MG tablet TAKE ONE TABLET EVERY DAY   traZODone 50 MG tablet Commonly known as: DESYREL Take 50 mg by mouth at bedtime as needed.         Time coordinating discharge: 39 minutes  Signed:  Janard Culp  [facility] 12/19/2019, 12:24 PM

## 2019-12-20 NOTE — Progress Notes (Signed)
CM received call from [facility] home health rep stating, the agency is unable to provide [facility] services as initially thought. CM reached out to other area agencies but was unable to find any to accept patient.  CM spoke with discharging MD and notified that at this time there is not agency that can provide [facility] services.  CM spoke with patient and notified him of this as well. Patient states that he is ok and does not feel that he needs HH as he is doing well on his own.

## 2020-01-18 ENCOUNTER — Ambulatory Visit: Payer: BC Managed Care – PPO | Attending: Internal Medicine

## 2020-01-18 DIAGNOSIS — Z23 Encounter for immunization: Secondary | ICD-10-CM

## 2020-01-18 NOTE — Progress Notes (Signed)
   Covid-19 Vaccination Clinic  Name:  Danny Kerr    MRN: [id number] DOB: 1956/08/18  01/18/2020  [name] was observed post Covid-19 immunization for 15 minutes without incident. He was provided with Vaccine Information Sheet and instruction to access the V-Safe system.   [name] was instructed to call 911 with any severe reactions post vaccine: Marland Kitchen Difficulty breathing  . Swelling of face and throat  . A fast heartbeat  . A bad rash all over body  . Dizziness and weakness   Immunizations Administered    Name Date Dose VIS Date Route   Pfizer COVID-19 Vaccine 01/18/2020 10:06 AM 0.3 mL 10/02/2019 Intramuscular   Manufacturer: [place]   Lot: [id number]   [place]: [id number]

## 2020-02-03 ENCOUNTER — Emergency Department (HOSPITAL_COMMUNITY)
Admission: EM | Admit: 2020-02-03 | Discharge: 2020-02-03 | Discharge status: Against Medical Advice | Payer: BC Managed Care – PPO | Attending: Emergency Medicine | Admitting: Emergency Medicine

## 2020-02-03 ENCOUNTER — Other Ambulatory Visit: Payer: Self-pay

## 2020-02-03 ENCOUNTER — Encounter (HOSPITAL_COMMUNITY): Payer: Self-pay | Admitting: Emergency Medicine

## 2020-02-03 DIAGNOSIS — Z79899 Other long term (current) drug therapy: Secondary | ICD-10-CM | POA: Diagnosis not present

## 2020-02-03 DIAGNOSIS — F10930 Alcohol use, unspecified with withdrawal, uncomplicated: Secondary | ICD-10-CM

## 2020-02-03 DIAGNOSIS — Z5320 Procedure and treatment not carried out because of patient's decision for unspecified reasons: Secondary | ICD-10-CM | POA: Insufficient documentation

## 2020-02-03 DIAGNOSIS — F10230 Alcohol dependence with withdrawal, uncomplicated: Secondary | ICD-10-CM | POA: Insufficient documentation

## 2020-02-03 DIAGNOSIS — Z87891 Personal history of nicotine dependence: Secondary | ICD-10-CM | POA: Diagnosis not present

## 2020-02-03 MED ORDER — SODIUM CHLORIDE 0.9 % IV BOLUS: 1000.0000 mL | Freq: Once | INTRAVENOUS | Status: DC

## 2020-02-03 MED ORDER — LORAZEPAM 2 MG/ML IJ SOLN: 2.0000 mg | Freq: Once | INTRAMUSCULAR | Status: DC

## 2020-02-03 NOTE — ED Triage Notes (Signed)
Pt requesting detox from alcohol; last drink 10 pm last night; "drink tons of vodka daily; too much, too fast; too long." Pt denies SI/HI.

## 2020-02-03 NOTE — ED Notes (Signed)
PA at bedside.

## 2020-02-03 NOTE — ED Provider Notes (Signed)
[facility] DEPT Provider Note   CSN: [id number] Arrival date & time: 02/03/20  1102     History Chief Complaint  Patient presents with  . detox    Danny Kerr is a 64 y.o. male.  Patient with history of alcohol abuse presents to the emergency department with complaint of alcohol withdrawal.  Patient states that he lives independently but is feeling bad all the time.  He had an admission in February for uncontrolled alcohol withdrawals requiring admission.  He states that he drinks at night and by 3 or 4 PM he is starting to have tremors.  Patient denies having any seizures or hallucinations, or history of same.  Patient drinks vodka daily.  He states that he is been drinking since about age 87 while he was growing up in [place].  He states that he recently went to a place in [place] for detox but did not feel comfortable there and left.  He states that he has been urged by friends to come to the hospital "to be detoxed".  Last time he was admitted because he had difficulty with ambulation and also significant nausea and vomiting making detox with Librium at home and possible.  Patient does not currently have any nausea or vomiting.  He denies chest or abdominal pain.  No fevers or cough.  Last drink of alcohol was approximately 10 PM yesterday.         Past Medical History:  Diagnosis Date  . Arthritis   . ETOH abuse   . Pneumonia   . Rosacea   . Seasonal allergies     Patient Active Problem List   Diagnosis Date Noted  . Alcohol withdrawal ([place]) 12/12/2019  . Thrombocytopenia ([place])   . Alcohol use disorder, severe, in early remission, in controlled environment ([place]) 10/03/2019  . 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 COLONOSCOPY    . KNEE ARTHROTOMY  1977   rt knee  . RADIOLOGY WITH ANESTHESIA N/A 09/03/2017   Procedure: MIR CERVICAL Burt Ek;  Surgeon: Radiologist,  Medication, MD;  Location: [facility];  Service: Radiology;  Laterality: N/A;  . TOTAL KNEE ARTHROPLASTY Right 09/06/2014   Procedure: RIGHT TOTAL KNEE ARTHROPLASTY;  Surgeon: Gearlean Alf, MD;  Location: [facility];  Service: Orthopedics;  Laterality: Right;       Family History  Problem Relation Age of Onset  . Lung cancer Mother   . Parkinson's disease Sister     Social History   Tobacco Use  . Smoking status: Former Smoker    Quit date: 08/26/1989    Years since quitting: 30.4  . Smokeless tobacco: Never Used  Substance Use Topics  . Alcohol use: Yes    Comment: heavy daily alcohol use  . Drug use: No    Home Medications Prior to Admission medications   Medication Sig Start Date End Date Taking? Authorizing Provider  Azelaic Acid 15 % cream Apply 1 application topically 2 (two) times daily. 04/26/19   [provider]  ergocalciferol (VITAMIN D2) 1.25 MG (50000 UT) capsule Take 50,000 Units by mouth once a week.     [provider]  escitalopram (LEXAPRO) 10 MG tablet Take 10 mg by mouth daily. 08/05/19   [provider]  folic acid (FOLVITE) 1 MG tablet Take 1 tablet (1 mg total) by mouth daily. 12/20/19   Pokhrel, Corrie Mckusick, MD  Multiple Vitamins-Minerals (QC MULTI-VITE 50 & OVER)  TABS TAKE ONE TABLET EVERY DAY 08/23/19   [provider]  ondansetron (ZOFRAN ODT) 4 MG disintegrating tablet Take 1 tablet (4 mg total) by mouth every 8 (eight) hours as needed for nausea or vomiting. 08/20/19   Henderly, Britni A, PA-C  thiamine 100 MG tablet TAKE ONE TABLET EVERY DAY 08/23/19   [provider]  traZODone (DESYREL) 50 MG tablet Take 50 mg by mouth at bedtime as needed. 08/23/19   [provider]    Allergies    Patient has no known allergies.  Review of Systems   Review of Systems  Constitutional: Negative for fever.  HENT: Negative for rhinorrhea and sore throat.   Eyes: Negative for redness.  Respiratory: Negative for cough.     Cardiovascular: Negative for chest pain.  Gastrointestinal: Positive for diarrhea. Negative for abdominal pain, nausea and vomiting.  Genitourinary: Negative for dysuria.  Musculoskeletal: Negative for myalgias.  Skin: Negative for rash.  Neurological: Positive for tremors. Negative for headaches.    Physical Exam Updated Vital Signs BP (!) 149/91 (BP Location: Left Arm)   Pulse 74   Temp 97.9 F (36.6 C) (Oral)   Resp 16   Ht 6\' 3"  (1.905 m)   Wt 86.2 kg   SpO2 100%   BMI 23.75 kg/m   Physical Exam Vitals and nursing note reviewed.  Constitutional:      Appearance: He is well-developed.  HENT:     Head: Normocephalic and atraumatic.  Eyes:     General:        Right eye: No discharge.        Left eye: No discharge.     Conjunctiva/sclera: Conjunctivae normal.  Cardiovascular:     Rate and Rhythm: Normal rate and regular rhythm.     Heart sounds: Normal heart sounds.  Pulmonary:     Effort: Pulmonary effort is normal.     Breath sounds: Normal breath sounds.  Abdominal:     Palpations: Abdomen is soft.     Tenderness: There is no abdominal tenderness.  Musculoskeletal:     Cervical back: Normal range of motion and neck supple.  Skin:    General: Skin is warm and dry.  Neurological:     General: No focal deficit present.     Mental Status: He is alert and oriented to person, place, and time.     Cranial Nerves: No cranial nerve deficit.     Motor: Tremor (mild, intention) present. No weakness.     Coordination: Coordination normal.  Psychiatric:        Thought Content: Thought content does not include homicidal or suicidal ideation.     ED Results / Procedures / Treatments   Labs (all labs ordered are listed, but only abnormal results are displayed) Labs Reviewed  COMPREHENSIVE METABOLIC PANEL  ETHANOL  CBC  RAPID URINE DRUG SCREEN, HOSP PERFORMED    EKG None  Radiology No results found.  Procedures Procedures (including critical care  time)  Medications Ordered in ED Medications  LORazepam (ATIVAN) injection 2 mg (has no administration in time range)  sodium chloride 0.9 % bolus 1,000 mL (has no administration in time range)    ED Course  I have reviewed the triage vital signs and the nursing notes.  Pertinent labs & imaging results that were available during my care of the patient were reviewed by me and considered in my medical decision making (see chart for details).  Patient seen and examined.  At time of  exam, patient with mild withdrawal symptoms, but with potential to become worse without treatment.  I have ordered IV fluids and IV Ativan.  Will check CIWA.  Will check basic lab work.  Discussed with patient that depending upon how his symptoms progress with treatment, he may require inpatient treatment or be able to be discharged home with referrals and Librium taper.  He did come to the hospital today with expectation of being admitted, however we discussed that this is not a was required if symptoms can be controlled and he is able to take oral medications.  Agrees to proceed with work-up.  Vital signs reviewed and are as follows: BP (!) 149/91 (BP Location: Left Arm)   Pulse 74   Temp 97.9 F (36.6 C) (Oral)   Resp 16   Ht 6\' 3"  (1.905 m)   Wt 86.2 kg   SpO2 100%   BMI 23.75 kg/m   1:42 PM I was notified by RN that patient eloped with his belongings stating that he was going to go to [facility] on Friday.  I went to the patient's room to offer Librium taper for interim, however he had already absconded.    MDM Rules/Calculators/A&P                         Final Clinical Impression(s) / ED Diagnoses Final diagnoses:  Alcohol withdrawal syndrome without complication [facility])    Rx / DC Orders ED Discharge Orders    None       Carlisle Cater, PA-C 02/03/20 1343    Carmin Muskrat, MD 02/03/20 1541

## 2020-02-03 NOTE — ED Triage Notes (Signed)
Per EMS- patient requesting detox from alcohol and states his last drink was last night.

## 2020-02-10 ENCOUNTER — Ambulatory Visit: Payer: Self-pay

## 2020-02-23 ENCOUNTER — Ambulatory Visit: Payer: BC Managed Care – PPO | Attending: Internal Medicine

## 2020-02-23 DIAGNOSIS — Z23 Encounter for immunization: Secondary | ICD-10-CM

## 2020-02-23 NOTE — Progress Notes (Signed)
   Covid-19 Vaccination Clinic  Name:  Danny Kerr    MRN: [id number] DOB: 1956-03-03  02/23/2020  [name] was observed post Covid-19 immunization for 15 minutes without incident. He was provided with Vaccine Information Sheet and instruction to access the V-Safe system.   [name] was instructed to call 911 with any severe reactions post vaccine: Marland Kitchen Difficulty breathing  . Swelling of face and throat  . A fast heartbeat  . A bad rash all over body  . Dizziness and weakness   Immunizations Administered    Name Date Dose VIS Date Route   Pfizer COVID-19 Vaccine 02/23/2020  4:11 PM 0.3 mL 12/16/2018 Intramuscular   Manufacturer: [place]   Lot: [id number]   [place]: [id number]

## 2020-03-17 ENCOUNTER — Ambulatory Visit: Payer: BC Managed Care – PPO | Admitting: Family Medicine

## 2020-03-22 ENCOUNTER — Other Ambulatory Visit: Payer: Self-pay

## 2020-03-23 ENCOUNTER — Ambulatory Visit (INDEPENDENT_AMBULATORY_CARE_PROVIDER_SITE_OTHER): Payer: BC Managed Care – PPO | Admitting: Family Medicine

## 2020-03-23 ENCOUNTER — Encounter: Payer: Self-pay | Admitting: Family Medicine

## 2020-03-23 VITALS — BP 150/90 | HR 94 | Temp 98.2°F | Ht 75.0 in | Wt 192.6 lb

## 2020-03-23 DIAGNOSIS — F1010 Alcohol abuse, uncomplicated: Secondary | ICD-10-CM | POA: Diagnosis not present

## 2020-03-23 DIAGNOSIS — F419 Anxiety disorder, unspecified: Secondary | ICD-10-CM | POA: Diagnosis not present

## 2020-03-23 MED ORDER — NALTREXONE HCL 50 MG PO TABS: 100.0000 mg | ORAL_TABLET | Freq: Every day | ORAL | 5 refills | Status: DC

## 2020-03-23 NOTE — Progress Notes (Signed)
Danny Kerr is a 64 y.o. male who presents today for an office visit.  He is a new patient.   Assessment/Plan:  Chronic Problems Addressed Today: Anxiety Continue Zoloft 50 mg daily.  Alcohol abuse Had extensive discussion with patient regarding his history of alcohol abuse and treatment.  He will continue seeing AA.  We will increase dose of naltrexone to 100 mg daily.  We will also place referral for him to see a counselor/therapist who specializes and alcohol abuse.  We will follow-up in a few weeks.     Subjective:  HPI:  Patient is here as a new patient.  He was recently discharged from [facility] for alcohol abuse.  Has longstanding history of alcohol abuse for at least 30 to 40 years.  It has caused strain with personal relationships.  He lost his job a couple of years ago.  He has been going to [facility] and has been compliant with this.  Is currently on naltrexone 50 mg daily.  He is also on Zoloft 80 mg daily.  He is seeing a counselor however does note that they are not an addiction specialist.  PMH:  The following were reviewed and entered/updated in epic: Past Medical History:  Diagnosis Date  . Arthritis   . ETOH abuse   . Pneumonia   . Rosacea   . Seasonal allergies    Patient Active Problem List   Diagnosis Date Noted  . Anxiety 03/23/2020  . Alcohol abuse 10/03/2019  . 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 COLONOSCOPY    . KNEE ARTHROTOMY  1977   rt knee  . RADIOLOGY WITH ANESTHESIA N/A 09/03/2017   Procedure: MIR CERVICAL Burt Ek;  Surgeon: Radiologist, Medication, MD;  Location: [facility];  Service: Radiology;  Laterality: N/A;  . TOTAL KNEE ARTHROPLASTY Right 09/06/2014   Procedure: RIGHT TOTAL KNEE ARTHROPLASTY;  Surgeon: Gearlean Alf, MD;  Location: [facility];  Service: Orthopedics;  Laterality: Right;    Family History  Problem Relation Age of Onset  . Lung cancer Mother   .  Cancer Mother   . Parkinson's disease Sister   . Heart disease Sister     Medications- reviewed and updated Current Outpatient Medications  Medication Sig Dispense Refill  . naltrexone (DEPADE) 50 MG tablet Take 2 tablets (100 mg total) by mouth daily. 60 tablet 5  . Potassium Chloride ER 20 MEQ TBCR Take 1 tablet by mouth 2 (two) times daily.    . sertraline (ZOLOFT) 50 MG tablet Take 50 mg by mouth daily.     No current facility-administered medications for this visit.    Allergies-reviewed and updated No Known Allergies  Social History   Socioeconomic History  . Marital status: Married    Spouse name: Not on file  . Number of children: Not on file  . Years of education: Not on file  . Highest education level: Not on file  Occupational History  . Not on file  Tobacco Use  . Smoking status: Former Smoker    Quit date: 08/26/1989    Years since quitting: 30.5  . Smokeless tobacco: Never Used  Substance and Sexual Activity  . Alcohol use: Yes    Comment: heavy daily alcohol use  . Drug use: No  . Sexual activity: Not on file  Other Topics Concern  . Not on file  Social History Narrative  . Not on file   Social Determinants  of Health   Financial Resource Strain:   . Difficulty of Paying Living Expenses:   Food Insecurity:   . Worried About Charity fundraiser in the Last Year:   . Arboriculturist in the Last Year:   Transportation Needs:   . Film/video editor (Medical):   Marland Kitchen Lack of Transportation (Non-Medical):   Physical Activity:   . Days of Exercise per Week:   . Minutes of Exercise per Session:   Stress:   . Feeling of Stress :   Social Connections:   . Frequency of Communication with Friends and Family:   . Frequency of Social Gatherings with Friends and Family:   . Attends Religious Services:   . Active Member of Clubs or Organizations:   . Attends Archivist Meetings:   Marland Kitchen Marital Status:          Objective:  Physical Exam: BP  (!) 150/90   Pulse 94   Temp 98.2 F (36.8 C) (Temporal)   Ht 6\' 3"  (1.905 m)   Wt 192 lb 9.6 oz (87.4 kg)   SpO2 99%   BMI 24.07 kg/m   Gen: No acute distress, resting comfortably Neuro: Grossly normal, moves all extremities Psych: Normal affect and thought content      Semiyah Newgent M. Jerline Pain, MD 03/23/2020 12:01 PM

## 2020-03-23 NOTE — Assessment & Plan Note (Signed)
Continue Zoloft 50 mg daily 

## 2020-03-23 NOTE — Assessment & Plan Note (Signed)
Had extensive discussion with patient regarding his history of alcohol abuse and treatment.  He will continue seeing AA.  We will increase dose of naltrexone to 100 mg daily.  We will also place referral for him to see a counselor/therapist who specializes and alcohol abuse.  We will follow-up in a few weeks.

## 2020-03-23 NOTE — Patient Instructions (Signed)
It was very nice to see you today!  Please increase your naltrexone dose to 100 mg daily.  I will also place a referral for you to see a addiction specialist.  Please give me a call in a couple weeks to let me know how you are doing.  Take care, Dr Jerline Pain  Please try these tips to maintain a healthy lifestyle:   Eat at least 3 REAL meals and 1-2 snacks per day.  Aim for no more than 5 hours between eating.  If you eat breakfast, please do so within one hour of getting up.    Each meal should contain half fruits/vegetables, one quarter protein, and one quarter carbs (no bigger than a computer mouse)   Cut down on sweet beverages. This includes juice, soda, and sweet tea.     Drink at least 1 glass of water with each meal and aim for at least 8 glasses per day   Exercise at least 150 minutes every week.

## 2020-04-08 ENCOUNTER — Other Ambulatory Visit: Payer: Self-pay | Admitting: *Deleted

## 2020-04-08 MED ORDER — SERTRALINE HCL 50 MG PO TABS: 50.0000 mg | ORAL_TABLET | Freq: Every day | ORAL | 1 refills | Status: DC

## 2020-04-12 ENCOUNTER — Telehealth: Payer: Self-pay

## 2020-04-12 NOTE — Telephone Encounter (Signed)
Rx Sertraline was send to pharmacy on 04/08/2020, Called pharmacy for verification, stated Rx ready to be pick up  Lvm to patient with information

## 2020-04-12 NOTE — Telephone Encounter (Signed)
[facility] reached out saying they have not received any prescription for Sertraline 50 MG. They are requesting that prescription.   [phone]

## 2020-04-13 ENCOUNTER — Telehealth: Payer: Self-pay | Admitting: Family Medicine

## 2020-04-13 NOTE — Telephone Encounter (Signed)
Error

## 2020-04-22 ENCOUNTER — Telehealth: Payer: Self-pay | Admitting: Family Medicine

## 2020-04-22 NOTE — Telephone Encounter (Signed)
Pt wife called stating she has some concerns about her husbands health and would like to speak with CMA or Dr. Jerline Pain. Please advise.

## 2020-04-22 NOTE — Telephone Encounter (Signed)
Left voice message for patient to call clinic.  

## 2020-04-22 NOTE — Telephone Encounter (Signed)
Patient concerned about rx being sent,notified sent

## 2020-06-27 ENCOUNTER — Inpatient Hospital Stay (HOSPITAL_COMMUNITY)
Admission: EM | Admit: 2020-06-27 | Discharge: 2020-07-06 | DRG: 897 | Disposition: A | Discharge status: Skilled Nursing Facility | Payer: Medicare Other | Attending: Student | Admitting: Student

## 2020-06-27 ENCOUNTER — Other Ambulatory Visit: Payer: Self-pay

## 2020-06-27 ENCOUNTER — Encounter (HOSPITAL_COMMUNITY): Payer: Self-pay

## 2020-06-27 DIAGNOSIS — E876 Hypokalemia: Secondary | ICD-10-CM

## 2020-06-27 DIAGNOSIS — R5381 Other malaise: Secondary | ICD-10-CM | POA: Diagnosis present

## 2020-06-27 DIAGNOSIS — F419 Anxiety disorder, unspecified: Secondary | ICD-10-CM | POA: Diagnosis present

## 2020-06-27 DIAGNOSIS — D696 Thrombocytopenia, unspecified: Secondary | ICD-10-CM | POA: Diagnosis not present

## 2020-06-27 DIAGNOSIS — F10230 Alcohol dependence with withdrawal, uncomplicated: Secondary | ICD-10-CM | POA: Diagnosis not present

## 2020-06-27 DIAGNOSIS — D6959 Other secondary thrombocytopenia: Secondary | ICD-10-CM | POA: Diagnosis present

## 2020-06-27 DIAGNOSIS — F10939 Alcohol use, unspecified with withdrawal, unspecified: Secondary | ICD-10-CM | POA: Diagnosis present

## 2020-06-27 DIAGNOSIS — Z20822 Contact with and (suspected) exposure to covid-19: Secondary | ICD-10-CM | POA: Diagnosis present

## 2020-06-27 DIAGNOSIS — F10932 Alcohol use, unspecified with withdrawal with perceptual disturbance: Secondary | ICD-10-CM

## 2020-06-27 DIAGNOSIS — Z79899 Other long term (current) drug therapy: Secondary | ICD-10-CM

## 2020-06-27 DIAGNOSIS — D6489 Other specified anemias: Secondary | ICD-10-CM | POA: Diagnosis present

## 2020-06-27 DIAGNOSIS — K7010 Alcoholic hepatitis without ascites: Secondary | ICD-10-CM | POA: Diagnosis present

## 2020-06-27 DIAGNOSIS — Z87891 Personal history of nicotine dependence: Secondary | ICD-10-CM

## 2020-06-27 DIAGNOSIS — F10232 Alcohol dependence with withdrawal with perceptual disturbance: Secondary | ICD-10-CM

## 2020-06-27 DIAGNOSIS — F1010 Alcohol abuse, uncomplicated: Secondary | ICD-10-CM

## 2020-06-27 DIAGNOSIS — E871 Hypo-osmolality and hyponatremia: Secondary | ICD-10-CM | POA: Diagnosis present

## 2020-06-27 DIAGNOSIS — F10930 Alcohol use, unspecified with withdrawal, uncomplicated: Secondary | ICD-10-CM

## 2020-06-27 DIAGNOSIS — F10239 Alcohol dependence with withdrawal, unspecified: Secondary | ICD-10-CM | POA: Diagnosis not present

## 2020-06-27 LAB — CBC
HCT: 36.3 % — ABNORMAL LOW (ref 39.0–52.0)
Hemoglobin: 12.7 g/dL — ABNORMAL LOW (ref 13.0–17.0)
MCH: 34.2 pg — ABNORMAL HIGH (ref 26.0–34.0)
MCHC: 35 g/dL (ref 30.0–36.0)
MCV: 97.8 fL (ref 80.0–100.0)
Platelets: 106 10*3/uL — ABNORMAL LOW (ref 150–400)
RBC: 3.71 MIL/uL — ABNORMAL LOW (ref 4.22–5.81)
RDW: 15.6 % — ABNORMAL HIGH (ref 11.5–15.5)
WBC: 4.5 10*3/uL (ref 4.0–10.5)
nRBC: 0 % (ref 0.0–0.2)

## 2020-06-27 LAB — PHOSPHORUS: Phosphorus: 2.8 mg/dL (ref 2.5–4.6)

## 2020-06-27 LAB — COMPREHENSIVE METABOLIC PANEL
ALT: 16 U/L (ref 0–44)
AST: 53 U/L — ABNORMAL HIGH (ref 15–41)
Albumin: 4.4 g/dL (ref 3.5–5.0)
Alkaline Phosphatase: 118 U/L (ref 38–126)
Anion gap: 14 (ref 5–15)
BUN: 8 mg/dL (ref 8–23)
CO2: 23 mmol/L (ref 22–32)
Calcium: 8.7 mg/dL — ABNORMAL LOW (ref 8.9–10.3)
Chloride: 101 mmol/L (ref 98–111)
Creatinine, Ser: 0.5 mg/dL — ABNORMAL LOW (ref 0.61–1.24)
GFR calc Af Amer: >60 mL/min (ref >60)
GFR calc non Af Amer: >60 mL/min (ref >60)
Glucose, Bld: 95 mg/dL (ref 70–99)
Potassium: 3.3 mmol/L — ABNORMAL LOW (ref 3.5–5.1)
Sodium: 138 mmol/L (ref 135–145)
Total Bilirubin: 1.3 mg/dL — ABNORMAL HIGH (ref 0.3–1.2)
Total Protein: 7.2 g/dL (ref 6.5–8.1)

## 2020-06-27 LAB — ETHANOL: Alcohol, Ethyl (B): 133 mg/dL — ABNORMAL HIGH (ref <10)

## 2020-06-27 LAB — SARS CORONAVIRUS 2 BY RT PCR (HOSPITAL ORDER, PERFORMED IN ~~LOC~~ HOSPITAL LAB): SARS Coronavirus 2: NEGATIVE

## 2020-06-27 LAB — RAPID URINE DRUG SCREEN, HOSP PERFORMED
Amphetamines: NOT DETECTED
Barbiturates: NOT DETECTED
Benzodiazepines: NOT DETECTED
Cocaine: NOT DETECTED
Opiates: NOT DETECTED
Tetrahydrocannabinol: NOT DETECTED

## 2020-06-27 LAB — MAGNESIUM: Magnesium: 1.7 mg/dL (ref 1.7–2.4)

## 2020-06-27 MED ORDER — POTASSIUM CHLORIDE ER 20 MEQ PO TBCR: 1.0000 | EXTENDED_RELEASE_TABLET | Freq: Two times a day (BID) | ORAL | Status: DC

## 2020-06-27 MED ORDER — METOPROLOL TARTRATE 5 MG/5ML IV SOLN: 5.0000 mg | Freq: Four times a day (QID) | INTRAVENOUS | Status: DC | PRN

## 2020-06-27 MED ORDER — POTASSIUM CHLORIDE CRYS ER 20 MEQ PO TBCR
20.0000 meq | EXTENDED_RELEASE_TABLET | Freq: Two times a day (BID) | ORAL | Status: DC
  Administered 2020-06-27: 20 meq via ORAL
  Filled 2020-06-27: qty 1

## 2020-06-27 MED ORDER — SODIUM CHLORIDE 0.9 % IV BOLUS
500.0000 mL | Freq: Once | INTRAVENOUS | Status: AC
  Administered 2020-06-27: 500 mL via INTRAVENOUS

## 2020-06-27 MED ORDER — THIAMINE HCL 100 MG/ML IJ SOLN
Freq: Once | INTRAVENOUS | Status: AC
  Filled 2020-06-27: qty 1000

## 2020-06-27 MED ORDER — ENOXAPARIN SODIUM 40 MG/0.4ML ~~LOC~~ SOLN
40.0000 mg | SUBCUTANEOUS | Status: DC
  Administered 2020-06-27 – 2020-07-02 (×6): 40 mg via SUBCUTANEOUS
  Filled 2020-06-27 (×7): qty 0.4

## 2020-06-27 MED ORDER — THIAMINE HCL 100 MG/ML IJ SOLN
100.0000 mg | Freq: Once | INTRAMUSCULAR | Status: AC
  Administered 2020-06-27: 100 mg via INTRAVENOUS
  Filled 2020-06-27: qty 2

## 2020-06-27 MED ORDER — SERTRALINE HCL 50 MG PO TABS
50.0000 mg | ORAL_TABLET | Freq: Every day | ORAL | Status: DC
  Administered 2020-06-27 – 2020-07-06 (×10): 50 mg via ORAL
  Filled 2020-06-27 (×10): qty 1

## 2020-06-27 MED ORDER — FOLIC ACID 1 MG PO TABS
1.0000 mg | ORAL_TABLET | Freq: Once | ORAL | Status: AC
  Administered 2020-06-27: 1 mg via ORAL
  Filled 2020-06-27: qty 1

## 2020-06-27 MED ORDER — LORAZEPAM 2 MG/ML IJ SOLN
1.0000 mg | Freq: Once | INTRAMUSCULAR | Status: AC
  Administered 2020-06-27: 1 mg via INTRAVENOUS
  Filled 2020-06-27: qty 1

## 2020-06-27 MED ORDER — ONDANSETRON HCL 4 MG PO TABS: 4.0000 mg | ORAL_TABLET | Freq: Four times a day (QID) | ORAL | Status: DC | PRN

## 2020-06-27 MED ORDER — LORAZEPAM 2 MG/ML IJ SOLN
1.0000 mg | INTRAMUSCULAR | Status: DC | PRN
  Administered 2020-06-27 (×4): 2 mg via INTRAVENOUS
  Administered 2020-06-28: 3 mg via INTRAVENOUS
  Administered 2020-06-28: 2 mg via INTRAVENOUS
  Administered 2020-06-28: 4 mg via INTRAVENOUS
  Administered 2020-06-28 (×2): 2 mg via INTRAVENOUS
  Filled 2020-06-27: qty 2
  Filled 2020-06-27 (×9): qty 1

## 2020-06-27 MED ORDER — LORAZEPAM 1 MG PO TABS
1.0000 mg | ORAL_TABLET | ORAL | Status: DC | PRN
  Administered 2020-06-27: 2 mg via ORAL
  Administered 2020-06-28: 3 mg via ORAL
  Filled 2020-06-27: qty 3
  Filled 2020-06-27: qty 2

## 2020-06-27 MED ORDER — ONDANSETRON HCL 4 MG/2ML IJ SOLN: 4.0000 mg | Freq: Four times a day (QID) | INTRAMUSCULAR | Status: DC | PRN

## 2020-06-27 NOTE — ED Triage Notes (Addendum)
Pt reports that he is going through DTs. Reports that he started feeling this way 1 hour ago. Last alcohol intake was last night. Pt sweating, tremulous, and is unable to walk at this time. Reports that he has gone through DTs before.

## 2020-06-27 NOTE — ED Provider Notes (Signed)
[facility] DEPT Provider Note   CSN: [id number] Arrival date & time: 06/27/20  4944     History Chief Complaint  Patient presents with  . Alcohol Problem    Danny [facility] is a 64 y.o. male.  HPI He presents for feeling like he cannot use his legs, and shaking.  He also complains of poor wound healing, occasionally falling, and states that he is a chronic alcoholic.  With difficulty using his legs started about an hour ago, spontaneously.  He denies back pain.  He was able to drive his car to get gasoline around 5 AM this morning.  After that he went home and called EMS to transfer him here.  He lives alone is not currently employed.  He denies vomiting, dizziness, weakness, headaches or chest pain.  He states that he drinks vodka daily, "3 glasses."  There are no other known modifying factors.    Past Medical History:  Diagnosis Date  . Arthritis   . ETOH abuse   . Pneumonia   . Rosacea   . Seasonal allergies     Patient Active Problem List   Diagnosis Date Noted  . Alcohol withdrawal ([place]) 06/27/2020  . Anxiety 03/23/2020  . Alcohol abuse 10/03/2019  . 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 COLONOSCOPY    . KNEE ARTHROTOMY  1977   rt knee  . RADIOLOGY WITH ANESTHESIA N/A 09/03/2017   Procedure: MIR CERVICAL Burt Ek;  Surgeon: Radiologist, Medication, MD;  Location: [facility];  Service: Radiology;  Laterality: N/A;  . TOTAL KNEE ARTHROPLASTY Right 09/06/2014   Procedure: RIGHT TOTAL KNEE ARTHROPLASTY;  Surgeon: Gearlean Alf, MD;  Location: [facility];  Service: Orthopedics;  Laterality: Right;       Family History  Problem Relation Age of Onset  . Lung cancer Mother   . Cancer Mother   . Parkinson's disease Sister   . Heart disease Sister     Social History   Tobacco Use  . Smoking status: Former Smoker    Quit date: 08/26/1989    Years since quitting: 30.8  .  Smokeless tobacco: Never Used  Vaping Use  . Vaping Use: Never used  Substance Use Topics  . Alcohol use: Yes    Comment: heavy daily alcohol use  . Drug use: No    Home Medications Prior to Admission medications   Medication Sig Start Date End Date Taking? Authorizing Provider  naltrexone (DEPADE) 50 MG tablet Take 2 tablets (100 mg total) by mouth daily. 03/23/20  Yes [name], MD  Potassium Chloride ER 20 MEQ TBCR Take 1 tablet by mouth 2 (two) times daily. 03/13/20  Yes [provider]  sertraline (ZOLOFT) 50 MG tablet Take 1 tablet (50 mg total) by mouth daily. 04/08/20  Yes [name], MD    Allergies    Patient has no known allergies.  Review of Systems   Review of Systems  All other systems reviewed and are negative.   Physical Exam Updated Vital Signs BP (!) 148/87 (BP Location: Right Arm)   Pulse 79   Temp 98.3 F (36.8 C) (Oral)   Resp (!) 22   Ht 6\' 3"  (1.905 m)   Wt 86.2 kg   SpO2 100%   BMI 23.75 kg/m   Physical Exam Vitals and nursing note reviewed.  Constitutional:      General: He is not in acute distress.  Appearance: He is well-developed. He is not ill-appearing, toxic-appearing or diaphoretic.  HENT:     Head: Normocephalic and atraumatic.     Right Ear: External ear normal.     Left Ear: External ear normal.  Eyes:     Conjunctiva/sclera: Conjunctivae normal.     Pupils: Pupils are equal, round, and reactive to light.  Neck:     Trachea: Phonation normal.  Cardiovascular:     Rate and Rhythm: Normal rate and regular rhythm.     Heart sounds: Normal heart sounds.  Pulmonary:     Effort: Pulmonary effort is normal.     Breath sounds: Normal breath sounds.  Abdominal:     General: There is no distension.     Palpations: Abdomen is soft. There is no mass.     Tenderness: There is no abdominal tenderness. There is no guarding.  Musculoskeletal:        General: Normal range of motion.     Cervical back: Normal range of  motion and neck supple.  Skin:    General: Skin is warm and dry.  Neurological:     Mental Status: He is alert and oriented to person, place, and time.     Cranial Nerves: No cranial nerve deficit.     Sensory: No sensory deficit.     Motor: No abnormal muscle tone.     Coordination: Coordination normal.     Comments: No dysarthria, or aphasia.  Mild tremor.  Psychiatric:        Mood and Affect: Mood normal.        Behavior: Behavior normal.        Thought Content: Thought content normal.        Judgment: Judgment normal.     ED Results / Procedures / Treatments   Labs (all labs ordered are listed, but only abnormal results are displayed) Labs Reviewed  COMPREHENSIVE METABOLIC PANEL - Abnormal; Notable for the following components:      Result Value   Potassium 3.3 (*)    Creatinine, Ser 0.50 (*)    Calcium 8.7 (*)    AST 53 (*)    Total Bilirubin 1.3 (*)    All other components within normal limits  ETHANOL - Abnormal; Notable for the following components:   Alcohol, Ethyl (B) 133 (*)    All other components within normal limits  CBC - Abnormal; Notable for the following components:   RBC 3.71 (*)    Hemoglobin 12.7 (*)    HCT 36.3 (*)    MCH 34.2 (*)    RDW 15.6 (*)    Platelets 106 (*)    All other components within normal limits  SARS CORONAVIRUS 2 BY RT PCR (HOSPITAL ORDER, [facility])  RAPID URINE DRUG SCREEN, HOSP PERFORMED  MAGNESIUM  PHOSPHORUS    EKG None  Radiology No results found.  Procedures .Critical Care Performed by: Daleen Bo, MD Authorized by: Daleen Bo, MD   Critical care provider statement:    Critical care time (minutes):  35   Critical care start time:  06/27/2020 7:42 AM   Critical care end time:  06/27/2020 10:57 AM   Critical care time was exclusive of:  Separately billable procedures and treating other patients   Critical care was necessary to treat or prevent imminent or life-threatening  deterioration of the following conditions:  CNS failure or compromise   Critical care was time spent personally by me on the following activities:  Blood draw for specimens, development of treatment plan with patient or surrogate, discussions with consultants, evaluation of patient's response to treatment, examination of patient, obtaining history from patient or surrogate, ordering and performing treatments and interventions, ordering and review of laboratory studies, pulse oximetry, re-evaluation of patient's condition, review of old charts and ordering and review of radiographic studies   (including critical care time)  Medications Ordered in ED Medications  LORazepam (ATIVAN) tablet 1-4 mg (has no administration in time range)    Or  LORazepam (ATIVAN) injection 1-4 mg (has no administration in time range)  sodium chloride 0.9 % bolus 500 mL (0 mLs Intravenous Stopped 06/27/20 1054)  thiamine (B-1) injection 100 mg (100 mg Intravenous Given 03/25/32 2951)  folic acid (FOLVITE) tablet 1 mg (1 mg Oral Given 06/27/20 0840)  LORazepam (ATIVAN) injection 1 mg (1 mg Intravenous Given 06/27/20 0847)    ED Course  I have reviewed the triage vital signs and the nursing notes.  Pertinent labs & imaging results that were available during my care of the patient were reviewed by me and considered in my medical decision making (see chart for details).  Clinical Course as of Jun 27 1056  Mon Jun 27, 2020  1036 Normal except potassium low, creatinine low, calcium low, AST high, total bilirubin high  Comprehensive metabolic panel(!) [EW]  8841 Abnormal, high  Ethanol(!) [EW]  1037 Normal except hemoglobin low, platelets low  cbc(!) [EW]    Clinical Course User Index [EW] Daleen Bo, MD   MDM Rules/Calculators/A&P                           Patient Vitals for the past 24 hrs:  BP Temp Temp src Pulse Resp SpO2 Height Weight  06/27/20 0843 (!) 148/87 98.3 F (36.8 C) Oral 79 (!) 22 100 % -- --   06/27/20 0659 133/87 98 F (36.7 C) Oral 94 18 99 % 6\' 3"  (1.905 m) 86.2 kg    10:37 AM Reevaluation with update and discussion. After initial assessment and treatment, an updated evaluation reveals he is more tremulous at this time and cannot recall when he arrived to the ED.  He states that he is hungry and has been able to drink "a little water."  Findings discussed with the patient, he is agreeable to hospitalization. Daleen Bo   Medical Decision Making:  This patient is presenting for evaluation of alcoholism, and difficulty moving legs, which does require a range of treatment options, and is a complaint that involves a high risk of morbidity and mortality. The differential diagnoses include metabolic disorders, complications of alcoholism. I decided to review old records, and in summary elderly male presenting with falling, and difficulty using legs which started a couple of hours prior to arrival.  I did not require additional historical information from anyone.  Clinical Laboratory Tests Ordered, included CBC, Metabolic panel and Alcohol level, drug screen ordered. Review indicates abnormalities include hypokalemia, low creatinine, elevated AST, elevated alcohol level, hemoglobin low, platelets low.   Cardiac Monitor Tracing which shows sinus tachycardia    Critical Interventions-clinical evaluation, laboratory testing, treatment with Ativan, CIWA protocol ordered, additional Ativan given for worsening tremors.  After These Interventions, the Patient was reevaluated and was found to require hospitalization for concern of impending DTs with alcohol withdrawal.  Patient has previously had DTs and is a heavy alcohol user, now withdrawing from alcohol.  CRITICAL CARE-yes Performed by: Daleen Bo  Nursing Notes  Reviewed/ Care Coordinated Applicable Imaging Reviewed Interpretation of Laboratory Data incorporated into ED treatment   10:47 AM-Consult complete with hospitalist.  Patient case explained and discussed.  He agrees to admit patient for further evaluation and treatment. Call ended at 10:57 AM   Plan: Admit   Final Clinical Impression(s) / ED Diagnoses Final diagnoses:  Alcohol withdrawal syndrome with perceptual disturbance ([place])  Alcohol abuse    Rx / DC Orders ED Discharge Orders    None       Daleen Bo, MD 06/27/20 1057

## 2020-06-27 NOTE — ED Notes (Signed)
Called main lab, who is able to add on Mag and Phosphorus to blood already there.

## 2020-06-27 NOTE — ED Notes (Signed)
ED Provider at bedside. 

## 2020-06-27 NOTE — H&P (Signed)
History and Physical        Hospital Admission Note Date: 06/27/2020  Patient name: Danny [facility] Medical record number: [id number] Date of birth: May 30, 1956 Age: 64 y.o. Gender: male  PCP: [name], MD  Patient coming from: home Lives with: alone At baseline, ambulates: independently but needs to hold on to the railing when going up and down stairs.  Chief Complaint    Chief Complaint  Patient presents with  . Alcohol Problem      HPI:   This is a 64 year old male with past medical history of alcohol abuse and falls who presented to the ED with complaints of not being able to utilize shaking that started at about 6 AM this a.m.  States that he was able to drive his car to get gas around 5 AM this morning but noticed the symptoms, went home and called EMS to transfer him here.  Reports that he drinks 2 to 3 glasses of vodka nightly, his last drink was last night.  Denies vomiting, diarrhea, dizziness, weakness, headaches or chest pain.  No other issues.  Denies any history of alcohol seizures.  States he had an episode of hallucinations in the past but does not have any hallucinations currently.  No known history of cirrhosis.  ED Course: hemodynamically stable on room air.  Labs overall unremarkable with the exception of K3.3, magnesium 1.7, platelets 106.  COVID-19 pending.  He was given Ativan 1 mg, 063 cc bolus NS, folic acid, thiamin.  Vitals:   06/27/20 1100 06/27/20 1119  BP: (!) 143/92 (!) 145/94  Pulse: 87 (!) 109  Resp: (!) 21   Temp:    SpO2: 96%      Review of Systems:  Review of Systems  Constitutional: Negative for chills and fever.  HENT: Negative.   Eyes: Negative.  Negative for blurred vision.  Respiratory: Negative for shortness of breath and wheezing.   Cardiovascular: Negative for chest pain and palpitations.  Gastrointestinal: Positive for  nausea. Negative for vomiting.  Musculoskeletal: Positive for myalgias.  Skin: Positive for itching.  Neurological: Positive for tremors and weakness. Negative for dizziness.  All other systems reviewed and are negative.   Medical/Social/Family History   Past Medical History: Past Medical History:  Diagnosis Date  . Arthritis   . ETOH abuse   . Pneumonia   . Rosacea   . Seasonal allergies     Past Surgical History:  Procedure Laterality Date  . COLONOSCOPY    . KNEE ARTHROTOMY  1977   rt knee  . RADIOLOGY WITH ANESTHESIA N/A 09/03/2017   Procedure: MIR CERVICAL Burt Ek;  Surgeon: Radiologist, Medication, MD;  Location: [facility];  Service: Radiology;  Laterality: N/A;  . TOTAL KNEE ARTHROPLASTY Right 09/06/2014   Procedure: RIGHT TOTAL KNEE ARTHROPLASTY;  Surgeon: Gearlean Alf, MD;  Location: [facility];  Service: Orthopedics;  Laterality: Right;    Medications: Prior to Admission medications   Medication Sig Start Date End Date Taking? Authorizing Provider  naltrexone (DEPADE) 50 MG tablet Take 2 tablets (100 mg total) by mouth daily. 03/23/20  Yes [name], MD  Potassium Chloride ER 20 MEQ TBCR Take 1 tablet by mouth 2 (two)  times daily. 03/13/20  Yes [provider]  sertraline (ZOLOFT) 50 MG tablet Take 1 tablet (50 mg total) by mouth daily. 04/08/20  Yes [name], MD    Allergies:  No Known Allergies  Social History:  reports that he quit smoking about 30 years ago. He has never used smokeless tobacco. He reports current alcohol use. He reports that he does not use drugs.  Family History: Family History  Problem Relation Age of Onset  . Lung cancer Mother   . Cancer Mother   . Parkinson's disease Sister   . Heart disease Sister      Objective   Physical Exam: Blood pressure (!) 145/94, pulse (!) 109, temperature 98.3 F (36.8 C), temperature source Oral, resp. rate (!) 21, height 6\' 3"  (1.905 m), weight 86.2 kg, SpO2 96  %.  Physical Exam Vitals and nursing note reviewed.  Constitutional:      Appearance: Normal appearance.     Comments: Tremulous extremities, tremulous voice  HENT:     Head: Normocephalic and atraumatic.     Nose:     Comments: Rhinophyma    Mouth/Throat:     Mouth: Mucous membranes are moist.  Eyes:     Conjunctiva/sclera: Conjunctivae normal.  Cardiovascular:     Rate and Rhythm: Normal rate and regular rhythm.  Pulmonary:     Effort: Pulmonary effort is normal. No respiratory distress.     Breath sounds: Normal breath sounds. No wheezing.  Abdominal:     General: Abdomen is flat.     Palpations: Abdomen is soft.  Musculoskeletal:        General: No swelling or tenderness.  Skin:    Coloration: Skin is not jaundiced or pale.  Neurological:     Mental Status: He is alert and oriented to person, place, and time. Mental status is at baseline.  Psychiatric:        Mood and Affect: Mood normal.        Behavior: Behavior normal.     LABS on Admission: I have personally reviewed all the labs and imaging below    Basic Metabolic Panel: Recent Labs  Lab 06/27/20 0725  NA 138  K 3.3*  CL 101  CO2 23  GLUCOSE 95  BUN 8  CREATININE 0.50*  CALCIUM 8.7*  MG 1.7  PHOS 2.8   Liver Function Tests: Recent Labs  Lab 06/27/20 0725  AST 53*  ALT 16  ALKPHOS 118  BILITOT 1.3*  PROT 7.2  ALBUMIN 4.4   No results for input(s): LIPASE, AMYLASE in the last 168 hours. No results for input(s): AMMONIA in the last 168 hours. CBC: Recent Labs  Lab 06/27/20 0725  WBC 4.5  HGB 12.7*  HCT 36.3*  MCV 97.8  PLT 106*   Cardiac Enzymes: No results for input(s): CKTOTAL, CKMB, CKMBINDEX, TROPONINI in the last 168 hours. BNP: Invalid input(s): POCBNP CBG: No results for input(s): GLUCAP in the last 168 hours.  Radiological Exams on Admission:  No results found.    EKG: Ordered, not done   A & P   Principal Problem:   Alcohol withdrawal ([facility]) Active  Problems:   Thrombocytopenia ([facility])   Anxiety   Hypokalemia   1. Alcohol withdrawal a. Drinks 2 to 3 glasses of vodka nightly, last drink was last night b. Tremulous, currently not with hallucinosis or DT c. Continue CIWA protocol d. Encourage cessation e. Banana bag f. PT eval  2. Hypokalemia a. Continue home supplement  3. Thrombocytopenia, stable a. Likely from alcohol use  4. Anxiety, stable a. Continue sertraline   DVT prophylaxis: lovenox   Code Status: Prior  Diet: heart healthy Family Communication: Admission, patients condition and plan of care including tests being ordered have been discussed with the patient who indicates understanding and agrees with the plan and Code Status.  Disposition Plan: The appropriate patient status for this patient is OBSERVATION. Observation status is judged to be reasonable and necessary in order to provide the required intensity of service to ensure the patient's safety. The patient's presenting symptoms, physical exam findings, and initial radiographic and laboratory data in the context of their medical condition is felt to place them at decreased risk for further clinical deterioration. Furthermore, it is anticipated that the patient will be medically stable for discharge from the hospital within 2 midnights of admission. The following factors support the patient status of observation.   " The patient's presenting symptoms include tremors, generalized weakness . " The physical exam findings include tremulous. " The initial radiographic and laboratory data are hypokalemia, thrombocytopenia.    Status is: Observation  The patient remains OBS appropriate and will d/c before 2 midnights.  Dispo: The patient is from: Home              Anticipated d/c is to: Home              Anticipated d/c date is: 1 day              Patient currently is not medically stable to d/c.      Consultants  . none  Procedures  . none  Time Spent on  Admission: 60 minutes    Harold Hedge, DO [facility] Pager [phone] 06/27/2020, 11:46 AM

## 2020-06-27 NOTE — ED Notes (Signed)
Transport called.

## 2020-06-28 ENCOUNTER — Other Ambulatory Visit: Payer: Self-pay

## 2020-06-28 DIAGNOSIS — Z79899 Other long term (current) drug therapy: Secondary | ICD-10-CM | POA: Diagnosis not present

## 2020-06-28 DIAGNOSIS — D696 Thrombocytopenia, unspecified: Secondary | ICD-10-CM | POA: Diagnosis not present

## 2020-06-28 DIAGNOSIS — Z87891 Personal history of nicotine dependence: Secondary | ICD-10-CM | POA: Diagnosis not present

## 2020-06-28 DIAGNOSIS — K7010 Alcoholic hepatitis without ascites: Secondary | ICD-10-CM | POA: Diagnosis present

## 2020-06-28 DIAGNOSIS — Z20822 Contact with and (suspected) exposure to covid-19: Secondary | ICD-10-CM | POA: Diagnosis present

## 2020-06-28 DIAGNOSIS — F10239 Alcohol dependence with withdrawal, unspecified: Secondary | ICD-10-CM | POA: Diagnosis present

## 2020-06-28 DIAGNOSIS — E871 Hypo-osmolality and hyponatremia: Secondary | ICD-10-CM | POA: Diagnosis present

## 2020-06-28 DIAGNOSIS — R5381 Other malaise: Secondary | ICD-10-CM | POA: Diagnosis present

## 2020-06-28 DIAGNOSIS — F10230 Alcohol dependence with withdrawal, uncomplicated: Secondary | ICD-10-CM | POA: Diagnosis not present

## 2020-06-28 DIAGNOSIS — F419 Anxiety disorder, unspecified: Secondary | ICD-10-CM | POA: Diagnosis present

## 2020-06-28 DIAGNOSIS — D6489 Other specified anemias: Secondary | ICD-10-CM | POA: Diagnosis present

## 2020-06-28 DIAGNOSIS — E876 Hypokalemia: Secondary | ICD-10-CM | POA: Diagnosis present

## 2020-06-28 DIAGNOSIS — D6959 Other secondary thrombocytopenia: Secondary | ICD-10-CM | POA: Diagnosis present

## 2020-06-28 LAB — COMPREHENSIVE METABOLIC PANEL
ALT: 14 U/L (ref 0–44)
AST: 39 U/L (ref 15–41)
Albumin: 3.9 g/dL (ref 3.5–5.0)
Alkaline Phosphatase: 103 U/L (ref 38–126)
Anion gap: 11 (ref 5–15)
BUN: 9 mg/dL (ref 8–23)
CO2: 24 mmol/L (ref 22–32)
Calcium: 8.8 mg/dL — ABNORMAL LOW (ref 8.9–10.3)
Chloride: 101 mmol/L (ref 98–111)
Creatinine, Ser: 0.52 mg/dL — ABNORMAL LOW (ref 0.61–1.24)
GFR calc Af Amer: >60 mL/min (ref >60)
GFR calc non Af Amer: >60 mL/min (ref >60)
Glucose, Bld: 90 mg/dL (ref 70–99)
Potassium: 3.2 mmol/L — ABNORMAL LOW (ref 3.5–5.1)
Sodium: 136 mmol/L (ref 135–145)
Total Bilirubin: 2.2 mg/dL — ABNORMAL HIGH (ref 0.3–1.2)
Total Protein: 6.8 g/dL (ref 6.5–8.1)

## 2020-06-28 LAB — CBC
HCT: 36.5 % — ABNORMAL LOW (ref 39.0–52.0)
Hemoglobin: 12.5 g/dL — ABNORMAL LOW (ref 13.0–17.0)
MCH: 34.1 pg — ABNORMAL HIGH (ref 26.0–34.0)
MCHC: 34.2 g/dL (ref 30.0–36.0)
MCV: 99.5 fL (ref 80.0–100.0)
Platelets: 84 10*3/uL — ABNORMAL LOW (ref 150–400)
RBC: 3.67 MIL/uL — ABNORMAL LOW (ref 4.22–5.81)
RDW: 15 % (ref 11.5–15.5)
WBC: 5.4 10*3/uL (ref 4.0–10.5)
nRBC: 0 % (ref 0.0–0.2)

## 2020-06-28 MED ORDER — CHLORDIAZEPOXIDE HCL 25 MG PO CAPS
25.0000 mg | ORAL_CAPSULE | Freq: Every day | ORAL | Status: AC
  Administered 2020-07-01: 25 mg via ORAL
  Filled 2020-06-28 (×2): qty 1

## 2020-06-28 MED ORDER — POTASSIUM CHLORIDE CRYS ER 20 MEQ PO TBCR
40.0000 meq | EXTENDED_RELEASE_TABLET | Freq: Two times a day (BID) | ORAL | Status: DC
  Administered 2020-06-28 – 2020-07-06 (×17): 40 meq via ORAL
  Filled 2020-06-28 (×17): qty 2

## 2020-06-28 MED ORDER — LORAZEPAM 2 MG/ML IJ SOLN
1.0000 mg | Freq: Once | INTRAMUSCULAR | Status: AC
  Administered 2020-06-28: 1 mg via INTRAVENOUS
  Filled 2020-06-28: qty 1

## 2020-06-28 MED ORDER — CHLORDIAZEPOXIDE HCL 25 MG PO CAPS
25.0000 mg | ORAL_CAPSULE | ORAL | Status: AC
  Administered 2020-06-30 – 2020-07-01 (×2): 25 mg via ORAL
  Filled 2020-06-28 (×2): qty 1

## 2020-06-28 MED ORDER — HYDROXYZINE HCL 25 MG PO TABS: 25.0000 mg | ORAL_TABLET | Freq: Four times a day (QID) | ORAL | Status: AC | PRN

## 2020-06-28 MED ORDER — CHLORDIAZEPOXIDE HCL 25 MG PO CAPS
25.0000 mg | ORAL_CAPSULE | Freq: Four times a day (QID) | ORAL | Status: AC
  Administered 2020-06-28 – 2020-06-29 (×5): 25 mg via ORAL
  Filled 2020-06-28 (×5): qty 1

## 2020-06-28 MED ORDER — LOPERAMIDE HCL 2 MG PO CAPS: 2.0000 mg | ORAL_CAPSULE | ORAL | Status: AC | PRN

## 2020-06-28 MED ORDER — FOLIC ACID 1 MG PO TABS
1.0000 mg | ORAL_TABLET | Freq: Every day | ORAL | Status: DC
  Administered 2020-06-28 – 2020-07-06 (×9): 1 mg via ORAL
  Filled 2020-06-28 (×9): qty 1

## 2020-06-28 MED ORDER — ADULT MULTIVITAMIN W/MINERALS CH
1.0000 | ORAL_TABLET | Freq: Every day | ORAL | Status: DC
  Administered 2020-06-28 – 2020-07-06 (×9): 1 via ORAL
  Filled 2020-06-28 (×9): qty 1

## 2020-06-28 MED ORDER — THIAMINE HCL 100 MG PO TABS
100.0000 mg | ORAL_TABLET | Freq: Every day | ORAL | Status: DC
  Administered 2020-06-28 – 2020-06-29 (×2): 100 mg via ORAL
  Filled 2020-06-28 (×2): qty 1

## 2020-06-28 MED ORDER — THIAMINE HCL 100 MG/ML IJ SOLN: 100.0000 mg | Freq: Every day | INTRAMUSCULAR | Status: DC

## 2020-06-28 MED ORDER — MAGNESIUM SULFATE 2 GM/50ML IV SOLN
2.0000 g | Freq: Once | INTRAVENOUS | Status: AC
  Administered 2020-06-28: 2 g via INTRAVENOUS
  Filled 2020-06-28: qty 50

## 2020-06-28 MED ORDER — CHLORDIAZEPOXIDE HCL 25 MG PO CAPS
25.0000 mg | ORAL_CAPSULE | Freq: Three times a day (TID) | ORAL | Status: AC
  Administered 2020-06-29 – 2020-06-30 (×3): 25 mg via ORAL
  Filled 2020-06-28 (×3): qty 1

## 2020-06-28 MED ORDER — LORAZEPAM 1 MG PO TABS
1.0000 mg | ORAL_TABLET | ORAL | Status: AC | PRN
  Administered 2020-06-29: 3 mg via ORAL
  Administered 2020-06-30: 1 mg via ORAL
  Filled 2020-06-28: qty 1
  Filled 2020-06-28: qty 3
  Filled 2020-06-28: qty 1

## 2020-06-28 MED ORDER — LORAZEPAM 2 MG/ML IJ SOLN
1.0000 mg | INTRAMUSCULAR | Status: AC | PRN
  Administered 2020-06-28 – 2020-06-29 (×2): 2 mg via INTRAVENOUS
  Administered 2020-06-29 – 2020-06-30 (×3): 1 mg via INTRAVENOUS
  Administered 2020-07-01: 2 mg via INTRAVENOUS
  Filled 2020-06-28 (×7): qty 1

## 2020-06-28 NOTE — Progress Notes (Signed)
PT Cancellation Note  Patient Details Name: Danny [facility] MRN: [id number] DOB: 1956-04-04   Cancelled Treatment:    Reason Eval/Treat Not Completed: Medical issues which prohibited therapy--RN requested PT be held on today. Will check back another day.    [facility]  Office: [phone] Pager: [phone]

## 2020-06-28 NOTE — Plan of Care (Signed)
  Problem: Education: Goal: Knowledge of General Education information will improve Description: Including pain rating scale, medication(s)/side effects and non-pharmacologic comfort measures Outcome: Not Progressing   

## 2020-06-28 NOTE — Progress Notes (Signed)
Called pt's wife Agostino Gorin to update her that pt had been transferred to room 1426. Sostenes Kauffmann, Laurel Dimmer, RN

## 2020-06-28 NOTE — Progress Notes (Signed)
PROGRESS NOTE  Danny Kerr  EXN:[id number] DOB: Mar 14, 1956 DOA: 06/27/2020 PCP: [name], MD  Brief Narrative: Danny Kerr is a 64 y.o. male with a history of alcoholism who presented on the morning of 9/6 by EMS due to alcohol withdrawal having had his last drink of vodka the prior evening. He was hemodynamically stable, afebrile, with alcohol level of 133. K 3.3, magnesium 1.7, platelets 106, TBili 1.3. He was given ativan, normal saline, vitamins, and admitted for alcohol withdrawal on CIWA protocol.   Assessment & Plan: Principal Problem:   Alcohol withdrawal ([place]) Active Problems:   Thrombocytopenia ([place])   Anxiety   Hypokalemia  Alcohol withdrawal: Has required significant amount of ativan.  - Augment to SDU protocol CIWA. Transfer to PCU. Has no hx seizures/DT's though at high risk over the next 24-48 hours. - Start librium taper.  - Continue thiamine, folic acid, MVM. s/p banana bag.   Alcoholism: His wife, from whom he is separated, reports he drinks a quart of vodka per day often, and has been increasingly confabulating. He first attempted rehabilitation at [facility] Oct 2020. After relapse, entered detox again Feb 2021, and again April 2021 at [facility] before returning to [facility] finished in May 2021. He began drinking hours after a 4 week stay there.  - CSW consulted, will need to continue providing resources.  - Has hyperbilirubinemia, thrombocytopenia, and AST elevation on admission suggestive of mild alcoholic hepatitis and possibly developing cirrhosis. Also, based on wife's report, may have developed Wernicke/Korasakoff symptoms. Will defer further work up at this time.  Hypokalemia:  - Supplement this AM and monitor  Thrombocytopenia:  - Monitor  DVT prophylaxis: Lovenox Code Status: Full Family Communication: Wife by phone Disposition Plan:  Status is: Inpatient  Remains inpatient appropriate because:Altered mental status  and Inpatient level of care appropriate due to severity of illness  Dispo: The patient is from: Home              Anticipated d/c is to: TBD              Anticipated d/c date is: > 3 days              Patient currently is not medically stable to d/c.  Consultants:   None  Procedures:   None  Antimicrobials:  None   Subjective: Pt reports anxiety, noting tremors in his extremities, knows he's here for alcohol withdrawal. Says he drinks a fifth of a fifth of vodka nightly. Has never had DT's or seizures per him and his wife.  Objective: Vitals:   06/28/20 1123 06/28/20 1224 06/28/20 1311 06/28/20 1336  BP: (!) 145/93 (!) 152/106 (!) 137/94 138/68  Pulse: 89 (!) 103 (!) 102 (!) 104  Resp:   20   Temp:   98.5 F (36.9 C)   TempSrc:      SpO2:   92%   Weight:      Height:        Intake/Output Summary (Last 24 hours) at 06/28/2020 1446 Last data filed at 06/28/2020 1307 Gross per 24 hour  Intake 1010 ml  Output 2275 ml  Net -1265 ml   Filed Weights   06/27/20 0659  Weight: 86.2 kg    Gen: Diaphoretic male in no acute distress Pulm: Non-labored breathing. Clear to auscultation bilaterally.  CV: Regular rate and rhythm. No murmur, rub, or gallop. No JVD, no pedal edema. GI: Abdomen soft, non-tender, non-distended, with normoactive bowel sounds.  No organomegaly or masses felt. Ext: Warm, no deformities Skin: No rashes, lesions or ulcers Neuro: Alert and oriented to person, place, situation. Diffuse tremor noted, no asterixis. Psych: Judgement and insight appear impaired. Recent memory appears to be impaired.  Data Reviewed: I have personally reviewed following labs and imaging studies  CBC: Recent Labs  Lab 06/27/20 0725 06/28/20 0309  WBC 4.5 5.4  HGB 12.7* 12.5*  HCT 36.3* 36.5*  MCV 97.8 99.5  PLT 106* 84*   Basic Metabolic Panel: Recent Labs  Lab 06/27/20 0725 06/28/20 0309  NA 138 136  K 3.3* 3.2*  CL 101 101  CO2 23 24  GLUCOSE 95 90  BUN 8 9   CREATININE 0.50* 0.52*  CALCIUM 8.7* 8.8*  MG 1.7  --   PHOS 2.8  --    GFR: Estimated Creatinine Clearance: 110 mL/min (A) (by C-G formula based on SCr of 0.52 mg/dL (L)). Liver Function Tests: Recent Labs  Lab 06/27/20 0725 06/28/20 0309  AST 53* 39  ALT 16 14  ALKPHOS 118 103  BILITOT 1.3* 2.2*  PROT 7.2 6.8  ALBUMIN 4.4 3.9   No results for input(s): LIPASE, AMYLASE in the last 168 hours. No results for input(s): AMMONIA in the last 168 hours. Coagulation Profile: No results for input(s): INR, PROTIME in the last 168 hours. Cardiac Enzymes: No results for input(s): CKTOTAL, CKMB, CKMBINDEX, TROPONINI in the last 168 hours. BNP (last 3 results) No results for input(s): PROBNP in the last 8760 hours. HbA1C: No results for input(s): HGBA1C in the last 72 hours. CBG: No results for input(s): GLUCAP in the last 168 hours. Lipid Profile: No results for input(s): CHOL, HDL, LDLCALC, TRIG, CHOLHDL, LDLDIRECT in the last 72 hours. Thyroid Function Tests: No results for input(s): TSH, T4TOTAL, FREET4, T3FREE, THYROIDAB in the last 72 hours. Anemia Panel: No results for input(s): VITAMINB12, FOLATE, FERRITIN, TIBC, IRON, RETICCTPCT in the last 72 hours. Urine analysis:    Component Value Date/Time   COLORURINE AMBER (A) 08/20/2019 1608   APPEARANCEUR CLEAR 08/20/2019 1608   LABSPEC 1.024 08/20/2019 1608   PHURINE 5.0 08/20/2019 1608   GLUCOSEU 50 (A) 08/20/2019 1608   HGBUR NEGATIVE 08/20/2019 1608   BILIRUBINUR NEGATIVE 08/20/2019 1608   KETONESUR 80 (A) 08/20/2019 1608   PROTEINUR NEGATIVE 08/20/2019 1608   UROBILINOGEN 0.2 08/26/2014 1425   NITRITE NEGATIVE 08/20/2019 1608   LEUKOCYTESUR NEGATIVE 08/20/2019 1608   Recent Results (from the past 240 hour(s))  SARS Coronavirus 2 by RT PCR (hospital order, performed in [place] hospital lab) Nasopharyngeal Nasopharyngeal Swab     Status: None   Collection Time: 06/27/20 10:45 AM   Specimen: Nasopharyngeal Swab   Result Value Ref Range Status   SARS Coronavirus 2 NEGATIVE NEGATIVE Final    Comment: (NOTE) SARS-CoV-2 target nucleic acids are NOT DETECTED.  The SARS-CoV-2 RNA is generally detectable in upper and lower respiratory specimens during the acute phase of infection. The lowest concentration of SARS-CoV-2 viral copies this assay can detect is 250 copies / mL. A negative result does not preclude SARS-CoV-2 infection and should not be used as the sole basis for treatment or other patient management decisions.  A negative result may occur with improper specimen collection / handling, submission of specimen other than nasopharyngeal swab, presence of viral mutation(s) within the areas targeted by this assay, and inadequate number of viral copies (<250 copies / mL). A negative result must be combined with clinical observations, patient history, and epidemiological information.  Fact Sheet for Patients:   StrictlyIdeas.no  Fact Sheet for Healthcare Providers: BankingDealers.co.za  This test is not yet approved or  cleared by the [place] FDA and has been authorized for detection and/or diagnosis of SARS-CoV-2 by FDA under an Emergency Use Authorization (EUA).  This EUA will remain in effect (meaning this test can be used) for the duration of the COVID-19 declaration under Section 564(b)(1) of the Act, 21 U.S.C. section 360bbb-3(b)(1), unless the authorization is terminated or revoked sooner.  Performed at [facility], [place] [address] [place], [place] [address]       Radiology Studies: No results found.  Scheduled Meds: . chlordiazePOXIDE  25 mg Oral QID   Followed by  . [START ON 06/29/2020] chlordiazePOXIDE  25 mg Oral TID   Followed by  . [START ON 06/30/2020] chlordiazePOXIDE  25 mg Oral BH-qamhs   Followed by  . [START ON 07/01/2020] chlordiazePOXIDE  25 mg Oral Daily  . enoxaparin (LOVENOX) injection  40  mg Subcutaneous Q24H  . folic acid  1 mg Oral Daily  . multivitamin with minerals  1 tablet Oral Daily  . potassium chloride  40 mEq Oral BID  . sertraline  50 mg Oral Daily  . thiamine  100 mg Oral Daily   Or  . thiamine  100 mg Intravenous Daily   Continuous Infusions:   LOS: 0 days   Time spent: 35 minutes.  Patrecia Pour, MD [facility] [email or web address] 06/28/2020, 2:46 PM

## 2020-06-28 NOTE — Progress Notes (Signed)
Patient CIWA score is ranging from 10-15 due to tremors, agitation, and auditory disturbances. Ativan given and patient assessed Q1 hour. Order to notify MD if CIWA score is greater than 10, two consecutive assessments. MD on call notified per paging system, no new orders. Patient is stable and being monitored. Dawson Bills, RN

## 2020-06-29 DIAGNOSIS — R5381 Other malaise: Secondary | ICD-10-CM

## 2020-06-29 DIAGNOSIS — R4189 Other symptoms and signs involving cognitive functions and awareness: Secondary | ICD-10-CM

## 2020-06-29 LAB — COMPREHENSIVE METABOLIC PANEL
ALT: 14 U/L (ref 0–44)
AST: 30 U/L (ref 15–41)
Albumin: 3.9 g/dL (ref 3.5–5.0)
Alkaline Phosphatase: 111 U/L (ref 38–126)
Anion gap: 14 (ref 5–15)
BUN: 10 mg/dL (ref 8–23)
CO2: 22 mmol/L (ref 22–32)
Calcium: 9.3 mg/dL (ref 8.9–10.3)
Chloride: 103 mmol/L (ref 98–111)
Creatinine, Ser: 0.54 mg/dL — ABNORMAL LOW (ref 0.61–1.24)
GFR calc Af Amer: >60 mL/min (ref >60)
GFR calc non Af Amer: >60 mL/min (ref >60)
Glucose, Bld: 105 mg/dL — ABNORMAL HIGH (ref 70–99)
Potassium: 4 mmol/L (ref 3.5–5.1)
Sodium: 139 mmol/L (ref 135–145)
Total Bilirubin: 2.2 mg/dL — ABNORMAL HIGH (ref 0.3–1.2)
Total Protein: 7.1 g/dL (ref 6.5–8.1)

## 2020-06-29 LAB — FERRITIN: Ferritin: 1248 ng/mL — ABNORMAL HIGH (ref 24–336)

## 2020-06-29 LAB — CBC
HCT: 38 % — ABNORMAL LOW (ref 39.0–52.0)
Hemoglobin: 13.1 g/dL (ref 13.0–17.0)
MCH: 34.2 pg — ABNORMAL HIGH (ref 26.0–34.0)
MCHC: 34.5 g/dL (ref 30.0–36.0)
MCV: 99.2 fL (ref 80.0–100.0)
Platelets: 107 10*3/uL — ABNORMAL LOW (ref 150–400)
RBC: 3.83 MIL/uL — ABNORMAL LOW (ref 4.22–5.81)
RDW: 14.6 % (ref 11.5–15.5)
WBC: 5.1 10*3/uL (ref 4.0–10.5)
nRBC: 0 % (ref 0.0–0.2)

## 2020-06-29 LAB — BILIRUBIN, FRACTIONATED(TOT/DIR/INDIR)
Bilirubin, Direct: 0.5 mg/dL — ABNORMAL HIGH (ref 0.0–0.2)
Indirect Bilirubin: 1.7 mg/dL — ABNORMAL HIGH (ref 0.3–0.9)
Total Bilirubin: 2.2 mg/dL — ABNORMAL HIGH (ref 0.3–1.2)

## 2020-06-29 LAB — PHOSPHORUS: Phosphorus: 3.1 mg/dL (ref 2.5–4.6)

## 2020-06-29 LAB — MAGNESIUM: Magnesium: 2 mg/dL (ref 1.7–2.4)

## 2020-06-29 LAB — IRON AND TIBC
Iron: 45 ug/dL (ref 45–182)
Saturation Ratios: 18 % (ref 17.9–39.5)
TIBC: 256 ug/dL (ref 250–450)
UIBC: 211 ug/dL

## 2020-06-29 LAB — VITAMIN B12: Vitamin B-12: 224 pg/mL (ref 180–914)

## 2020-06-29 LAB — RETICULOCYTES
Immature Retic Fract: 13.1 % (ref 2.3–15.9)
RBC.: 3.73 MIL/uL — ABNORMAL LOW (ref 4.22–5.81)
Retic Count, Absolute: 143.2 10*3/uL (ref 19.0–186.0)
Retic Ct Pct: 3.8 % — ABNORMAL HIGH (ref 0.4–3.1)

## 2020-06-29 LAB — FOLATE: Folate: 19.9 ng/mL (ref >5.9)

## 2020-06-29 MED ORDER — VITAMIN B-12 1000 MCG PO TABS
1000.0000 ug | ORAL_TABLET | Freq: Every day | ORAL | Status: DC
  Administered 2020-06-29 – 2020-07-06 (×8): 1000 ug via ORAL
  Filled 2020-06-29 (×8): qty 1

## 2020-06-29 MED ORDER — THIAMINE HCL 100 MG/ML IJ SOLN
500.0000 mg | Freq: Three times a day (TID) | INTRAVENOUS | Status: AC
  Administered 2020-06-29 – 2020-07-02 (×9): 500 mg via INTRAVENOUS
  Filled 2020-06-29 (×5): qty 5
  Filled 2020-06-29: qty 4
  Filled 2020-06-29 (×5): qty 5

## 2020-06-29 NOTE — Progress Notes (Addendum)
PROGRESS NOTE  Danny Kerr XAJ:[id number] DOB: 10-Jun-1956   PCP: [name], MD  Patient is from: Home.  DOA: 06/27/2020 LOS: 1  Brief Narrative / Interim history: 64 y.o. male with a history of alcoholism who presented on the morning of 9/6 by EMS due to alcohol withdrawal having had his last drink of vodka the prior evening. He was hemodynamically stable, afebrile, with alcohol level of 133. K 3.3, magnesium 1.7, platelets 106, TBili 1.3. He was given ativan, normal saline, vitamins, and admitted for alcohol withdrawal on CIWA protocol.   Patient is on Librium taper with as needed Ativan  Subjective: Seen and examined earlier this morning.  Feels shaky.  Reports visual hallucination but he denies auditory hallucination.  He feels like his lips are in the way to eat.  He feels his legs are sleeping.  Denies chest pain, dyspnea, nausea, vomiting or abdominal pain.  Objective: Vitals:   06/29/20 0207 06/29/20 0450 06/29/20 1100 06/29/20 1432  BP: (!) 142/95 131/89 (!) 128/96 122/74  Pulse: 85 99 94 92  Resp: (!) 22 19 20 18   Temp: 98.7 F (37.1 C) 98.6 F (37 C) 98.5 F (36.9 C) 99.5 F (37.5 C)  TempSrc:   Oral Oral  SpO2: 99% 98% 99% 99%  Weight:      Height:        Intake/Output Summary (Last 24 hours) at 06/29/2020 1528 Last data filed at 06/29/2020 0351 Gross per 24 hour  Intake 180 ml  Output 625 ml  Net -445 ml   Filed Weights   06/27/20 0659  Weight: 86.2 kg    Examination:  GENERAL: Somewhat shaky and tremors HEENT: MMM.  Vision and hearing grossly intact.  NECK: Supple.  No apparent JVD.  RESP: On room air no IWOB.  Fair aeration bilaterally. CVS:  RRR. Heart sounds normal.  ABD/GI/GU: BS+. Abd soft, NTND.  MSK/EXT:  Moves extremities. No apparent deformity. No edema.  SKIN: no apparent skin lesion or wound NEURO: Awake, alert and oriented appropriately.  Tremors.  No apparent focal neuro deficit advanced generalized weakness PSYCH: Calm. Normal  affect.   Procedures:  None  Microbiology summarized: COVID-19 PCR negative.  Assessment & Plan: Alcoholism/alcohol withdrawal-reportedly drinks a quart of vodka per day.  Attempted multiple rehabilitation programs without significant success.  Now with tremors, visual hallucination and weakness.  CIWA ranges from 0-18 in the last 24 hours.  Most recent 16.  Mainly for tremor anxiety and visual disturbance.  -Continue Librium taper with as needed Ativan per CIWA protocol -Continue folic acid and multivitamins -TOC consulted for resources  Cognitive decline: patient wife RN in ambulatory setting concerned about possible Wernicke/Korasakoff after reading online.  He hasn't had MRI brain.  Obviously he is at risk -Will start high-dose thiamine for the next 3 days  Elevated liver enzymes/hyperbilirubinemia-likely alcoholic hepatitis.  Resolved except for elevated bilirubin. -Fractionate bilirubin in the morning  Hypokalemia: Resolved.  Thrombocytopenia: Platelet 107.  Likely due to alcohol.  Improving. -Continue monitoring  Generalized weakness/debility -Start p.o. vitamin B12 1000 mcg daily -PT/OT eval   Body mass index is 23.75 kg/m.         DVT prophylaxis:  enoxaparin (LOVENOX) injection 40 mg Start: 06/27/20 2200  Code Status: Full code Family Communication: Updated patient's wife over the phone. Status is: Inpatient  Remains inpatient appropriate because:IV treatments appropriate due to intensity of illness or inability to take PO, Inpatient level of care appropriate due to severity of illness and  Significant alcohol withdrawal symptoms with elevated CIWA scores   Dispo: The patient is from: Home              Anticipated d/c is to: To be determined              Anticipated d/c date is: 2 days              Patient currently is not medically stable to d/c.       Consultants:  None    Sch Meds:  Scheduled Meds: . chlordiazePOXIDE  25 mg Oral QID    Followed by  . chlordiazePOXIDE  25 mg Oral TID   Followed by  . [START ON 06/30/2020] chlordiazePOXIDE  25 mg Oral BH-qamhs   Followed by  . [START ON 07/01/2020] chlordiazePOXIDE  25 mg Oral Daily  . enoxaparin (LOVENOX) injection  40 mg Subcutaneous Q24H  . folic acid  1 mg Oral Daily  . multivitamin with minerals  1 tablet Oral Daily  . potassium chloride  40 mEq Oral BID  . sertraline  50 mg Oral Daily  . thiamine  100 mg Oral Daily   Or  . thiamine  100 mg Intravenous Daily   Continuous Infusions: PRN Meds:.hydrOXYzine, loperamide, LORazepam **OR** LORazepam, metoprolol tartrate, ondansetron **OR** ondansetron (ZOFRAN) IV  Antimicrobials: Anti-infectives (From admission, onward)   None       I have personally reviewed the following labs and images: CBC: Recent Labs  Lab 06/27/20 0725 06/28/20 0309 06/29/20 0508  WBC 4.5 5.4 5.1  HGB 12.7* 12.5* 13.1  HCT 36.3* 36.5* 38.0*  MCV 97.8 99.5 99.2  PLT 106* 84* 107*   BMP &GFR Recent Labs  Lab 06/27/20 0725 06/28/20 0309 06/29/20 0508  NA 138 136 139  K 3.3* 3.2* 4.0  CL 101 101 103  CO2 23 24 22   GLUCOSE 95 90 105*  BUN 8 9 10   CREATININE 0.50* 0.52* 0.54*  CALCIUM 8.7* 8.8* 9.3  MG 1.7  --  2.0  PHOS 2.8  --  3.1   Estimated Creatinine Clearance: 110 mL/min (A) (by C-G formula based on SCr of 0.54 mg/dL (L)). Liver & Pancreas: Recent Labs  Lab 06/27/20 0725 06/28/20 0309 06/29/20 0508  AST 53* 39 30  ALT 16 14 14   ALKPHOS 118 103 111  BILITOT 1.3* 2.2* 2.2*  PROT 7.2 6.8 7.1  ALBUMIN 4.4 3.9 3.9   No results for input(s): LIPASE, AMYLASE in the last 168 hours. No results for input(s): AMMONIA in the last 168 hours. Diabetic: No results for input(s): HGBA1C in the last 72 hours. No results for input(s): GLUCAP in the last 168 hours. Cardiac Enzymes: No results for input(s): CKTOTAL, CKMB, CKMBINDEX, TROPONINI in the last 168 hours. No results for input(s): PROBNP in the last 8760  hours. Coagulation Profile: No results for input(s): INR, PROTIME in the last 168 hours. Thyroid Function Tests: No results for input(s): TSH, T4TOTAL, FREET4, T3FREE, THYROIDAB in the last 72 hours. Lipid Profile: No results for input(s): CHOL, HDL, LDLCALC, TRIG, CHOLHDL, LDLDIRECT in the last 72 hours. Anemia Panel: Recent Labs    06/29/20 1115  VITAMINB12 224  FOLATE 19.9  FERRITIN 1,248*  TIBC 256  IRON 45  RETICCTPCT 3.8*   Urine analysis:    Component Value Date/Time   COLORURINE AMBER (A) 08/20/2019 1608   APPEARANCEUR CLEAR 08/20/2019 1608   LABSPEC 1.024 08/20/2019 1608   PHURINE 5.0 08/20/2019 1608   GLUCOSEU 50 (A) 08/20/2019 1608  HGBUR NEGATIVE 08/20/2019 [place] 08/20/2019 1608   KETONESUR 80 (A) 08/20/2019 [place] 08/20/2019 1608   UROBILINOGEN 0.2 08/26/2014 1425   NITRITE NEGATIVE 08/20/2019 1608   LEUKOCYTESUR NEGATIVE 08/20/2019 1608   Sepsis Labs: Invalid input(s): PROCALCITONIN, [place]  Microbiology: Recent Results (from the past 240 hour(s))  SARS Coronavirus 2 by RT PCR (hospital order, performed in [facility] hospital lab) Nasopharyngeal Nasopharyngeal Swab     Status: None   Collection Time: 06/27/20 10:45 AM   Specimen: Nasopharyngeal Swab  Result Value Ref Range Status   SARS Coronavirus 2 NEGATIVE NEGATIVE Final    Comment: (NOTE) SARS-CoV-2 target nucleic acids are NOT DETECTED.  The SARS-CoV-2 RNA is generally detectable in upper and lower respiratory specimens during the acute phase of infection. The lowest concentration of SARS-CoV-2 viral copies this assay can detect is 250 copies / mL. A negative result does not preclude SARS-CoV-2 infection and should not be used as the sole basis for treatment or other patient management decisions.  A negative result may occur with improper specimen collection / handling, submission of specimen other than nasopharyngeal swab, presence of viral  mutation(s) within the areas targeted by this assay, and inadequate number of viral copies (<250 copies / mL). A negative result must be combined with clinical observations, patient history, and epidemiological information.  Fact Sheet for Patients:   StrictlyIdeas.no  Fact Sheet for Healthcare Providers: BankingDealers.co.za  This test is not yet approved or  cleared by the [place] FDA and has been authorized for detection and/or diagnosis of SARS-CoV-2 by FDA under an Emergency Use Authorization (EUA).  This EUA will remain in effect (meaning this test can be used) for the duration of the COVID-19 declaration under Section 564(b)(1) of the Act, 21 U.S.C. section 360bbb-3(b)(1), unless the authorization is terminated or revoked sooner.  Performed at [facility], [place] [address], [place], [place] [address]     Radiology Studies: No results found.     Ivyrose Hashman T. [place]  If 7PM-7AM, please contact night-coverage [email or web address] 06/29/2020, 3:28 PM

## 2020-06-29 NOTE — Progress Notes (Signed)
Got permission to speak with wife, Danny Kerr. Gave update. Patient is still hallucinating and having severe tremors, but is currently able to hold and drink from a cup. Wife would like TOC involved for options for rehab after discharge.Eulas Post, RN

## 2020-06-29 NOTE — Progress Notes (Signed)
PT Cancellation Note  Patient Details Name: Danny Kerr MRN: [id number] DOB: [birth year]   Cancelled Treatment:    Reason Eval/Treat Not Completed: Medical issues which prohibited therapy (RN requests we hold PT eval until tomorrow- still not appropriate). Per RN, pt is more appropriate today, but is hallucinating and having bad tremors. RN requests we hold PT eval until tomorrow. Will continue to check back as schedule permits.   Talbot Grumbling PT, DPT 06/29/20, 9:27 AM

## 2020-06-30 LAB — AMMONIA: Ammonia: 15 umol/L (ref 9–35)

## 2020-06-30 LAB — RENAL FUNCTION PANEL
Albumin: 3.6 g/dL (ref 3.5–5.0)
Anion gap: 8 (ref 5–15)
BUN: 11 mg/dL (ref 8–23)
CO2: 24 mmol/L (ref 22–32)
Calcium: 9.1 mg/dL (ref 8.9–10.3)
Chloride: 102 mmol/L (ref 98–111)
Creatinine, Ser: 0.56 mg/dL — ABNORMAL LOW (ref 0.61–1.24)
GFR calc Af Amer: >60 mL/min (ref >60)
GFR calc non Af Amer: >60 mL/min (ref >60)
Glucose, Bld: 101 mg/dL — ABNORMAL HIGH (ref 70–99)
Phosphorus: 3.3 mg/dL (ref 2.5–4.6)
Potassium: 4 mmol/L (ref 3.5–5.1)
Sodium: 134 mmol/L — ABNORMAL LOW (ref 135–145)

## 2020-06-30 LAB — MAGNESIUM: Magnesium: 1.8 mg/dL (ref 1.7–2.4)

## 2020-06-30 MED ORDER — ENSURE ENLIVE PO LIQD
237.0000 mL | Freq: Two times a day (BID) | ORAL | Status: DC
  Administered 2020-06-30 – 2020-07-06 (×10): 237 mL via ORAL

## 2020-06-30 MED ORDER — POLYVINYL ALCOHOL 1.4 % OP SOLN
1.0000 [drp] | OPHTHALMIC | Status: DC | PRN
  Filled 2020-06-30: qty 15

## 2020-06-30 NOTE — Progress Notes (Signed)
PROGRESS NOTE  Danny Kerr:[id number] DOB: 1956-01-23   PCP: [name], MD  Patient is from: Home.  DOA: 06/27/2020 LOS: 2  Brief Narrative / Interim history: 64 y.o. male with a history of alcoholism who presented on the morning of 9/6 by EMS due to alcohol withdrawal having had his last drink of vodka the prior evening. He was hemodynamically stable, afebrile, with alcohol level of 133. K 3.3, magnesium 1.7, platelets 106, TBili 1.3. He was given ativan, normal saline, vitamins, and admitted for alcohol withdrawal on CIWA protocol.   Patient is on Librium taper with as needed Ativan. Also started on high-dose IV thiamine.  Subjective: Seen and examined earlier this morning. No major events overnight of this morning. Had elevated CIWA to 16 about 10 PM last night. It seems he has slept overnight and his CIWA score has been zero until this morning when it went up to 8. He still does not feel well. He is weak and tremulous. He denies chest pain, shortness of breath, emesis, abdominal pain or UTI symptoms. He feels nauseous. Continues to report visual hallucination but was not able to elaborate.  Objective: Vitals:   06/29/20 1844 06/29/20 2201 06/30/20 0456 06/30/20 1414  BP: 132/88 129/87 (!) 138/96 119/67  Pulse: 78 70 78 72  Resp: 16 20 18 20   Temp: 98.4 F (36.9 C) 98.7 F (37.1 C) 98.7 F (37.1 C) 98.6 F (37 C)  TempSrc:  Oral Oral   SpO2: 100% 98% 99% 100%  Weight:      Height:        Intake/Output Summary (Last 24 hours) at 06/30/2020 1511 Last data filed at 06/30/2020 0900 Gross per 24 hour  Intake 1610 ml  Output 800 ml  Net 810 ml   Filed Weights   06/27/20 0659  Weight: 86.2 kg    Examination:  GENERAL: No apparent distress.  Nontoxic. HEENT: MMM.  Vision and hearing grossly intact.  NECK: Supple.  No apparent JVD.  RESP: On room air. No IWOB.  Fair aeration bilaterally. CVS:  RRR. Heart sounds normal.  ABD/GI/GU: BS+. Abd soft, NTND.   MSK/EXT:  Moves extremities. No apparent deformity. No edema.  SKIN: no apparent skin lesion or wound NEURO: Sleepy but wakes to voice easily. Oriented x4. 3/3 on MME (both immediate and delayed). He was tremulous when he woke up but calmed down later.  PSYCH: Calm. Normal affect.   Procedures:  None  Microbiology summarized: COVID-19 PCR negative.  Assessment & Plan: Alcoholism/alcohol withdrawal-reportedly drinks a quart of vodka per day.  Attempted multiple rehabilitation programs without significant success.  Now with tremors, visual hallucination and weakness. CIWA score improving. Most recent 8 earlier this morning.  -Continue Librium taper with as needed Ativan per CIWA protocol -Continue folic acid, multivitamin and vitamin B12 -TOC consulted for resources  Cognitive decline: patient wife RN in ambulatory setting concerned about possible Wernicke/Korasakoff after reading online.  He hasn't had MRI brain.  Obviously he is at risk -Will start high-dose thiamine for the next 3 days  Elevated liver enzymes/hyperbilirubinemia-likely alcoholic hepatitis.  Resolved except for elevated bilirubin. Total bili 2.2 (1.7 and direct). This seems to be chronic back to 2020. With a stable H&H, there could be some abnormalities with conjugation -Continue monitoring  Hypokalemia: Resolved.  Mild hyponatremia: Na 134. Could be due to alcohol. -Continue monitoring  Thrombocytopenia: Platelet 107.  Likely due to alcohol.  Improving. -Continue monitoring  Generalized weakness/debility -Start p.o. vitamin B12 1000  mcg daily -PT/OT eval   Body mass index is 23.75 kg/m. Nutrition Problem: Increased nutrient needs Etiology:  (ETOH abuse) Signs/Symptoms: estimated needs Interventions: Ensure Enlive (each supplement provides 350kcal and 20 grams of protein), Magic cup   DVT prophylaxis:  enoxaparin (LOVENOX) injection 40 mg Start: 06/27/20 2200  Code Status: Full code Family  Communication: Updated patient's wife over the phone on 9/8 with patient's permission Status is: Inpatient  Remains inpatient appropriate because:IV treatments appropriate due to intensity of illness or inability to take PO, Inpatient level of care appropriate due to severity of illness and Significant alcohol withdrawal symptoms with elevated CIWA scores   Dispo: The patient is from: Home              Anticipated d/c is to: To be determined              Anticipated d/c date is: 2 days              Patient currently is not medically stable to d/c.       Consultants:  None    Sch Meds:  Scheduled Meds: . chlordiazePOXIDE  25 mg Oral TID   Followed by  . chlordiazePOXIDE  25 mg Oral BH-qamhs   Followed by  . [START ON 07/01/2020] chlordiazePOXIDE  25 mg Oral Daily  . enoxaparin (LOVENOX) injection  40 mg Subcutaneous Q24H  . feeding supplement (ENSURE ENLIVE)  237 mL Oral BID BM  . folic acid  1 mg Oral Daily  . multivitamin with minerals  1 tablet Oral Daily  . potassium chloride  40 mEq Oral BID  . sertraline  50 mg Oral Daily  . vitamin B-12  1,000 mcg Oral Daily   Continuous Infusions: . thiamine injection 500 mg (06/30/20 1103)   PRN Meds:.hydrOXYzine, loperamide, LORazepam **OR** LORazepam, metoprolol tartrate, ondansetron **OR** ondansetron (ZOFRAN) IV, polyvinyl alcohol  Antimicrobials: Anti-infectives (From admission, onward)   None       I have personally reviewed the following labs and images: CBC: Recent Labs  Lab 06/27/20 0725 06/28/20 0309 06/29/20 0508  WBC 4.5 5.4 5.1  HGB 12.7* 12.5* 13.1  HCT 36.3* 36.5* 38.0*  MCV 97.8 99.5 99.2  PLT 106* 84* 107*   BMP &GFR Recent Labs  Lab 06/27/20 0725 06/28/20 0309 06/29/20 0508 06/30/20 0513  NA 138 136 139 134*  K 3.3* 3.2* 4.0 4.0  CL 101 101 103 102  CO2 23 24 22 24   GLUCOSE 95 90 105* 101*  BUN 8 9 10 11   CREATININE 0.50* 0.52* 0.54* 0.56*  CALCIUM 8.7* 8.8* 9.3 9.1  MG 1.7  --  2.0  1.8  PHOS 2.8  --  3.1 3.3   Estimated Creatinine Clearance: 110 mL/min (A) (by C-G formula based on SCr of 0.56 mg/dL (L)). Liver & Pancreas: Recent Labs  Lab 06/27/20 0725 06/28/20 0309 06/29/20 0508 06/30/20 0513  AST 53* 39 30  --   ALT 16 14 14   --   ALKPHOS 118 103 111  --   BILITOT 1.3* 2.2* 2.2*  2.2*  --   PROT 7.2 6.8 7.1  --   ALBUMIN 4.4 3.9 3.9 3.6   No results for input(s): LIPASE, AMYLASE in the last 168 hours. Recent Labs  Lab 06/30/20 0513  AMMONIA 15   Diabetic: No results for input(s): HGBA1C in the last 72 hours. No results for input(s): GLUCAP in the last 168 hours. Cardiac Enzymes: No results for input(s): CKTOTAL, CKMB, CKMBINDEX, TROPONINI  in the last 168 hours. No results for input(s): PROBNP in the last 8760 hours. Coagulation Profile: No results for input(s): INR, PROTIME in the last 168 hours. Thyroid Function Tests: No results for input(s): TSH, T4TOTAL, FREET4, T3FREE, THYROIDAB in the last 72 hours. Lipid Profile: No results for input(s): CHOL, HDL, LDLCALC, TRIG, CHOLHDL, LDLDIRECT in the last 72 hours. Anemia Panel: Recent Labs    06/29/20 1115  VITAMINB12 224  FOLATE 19.9  FERRITIN 1,248*  TIBC 256  IRON 45  RETICCTPCT 3.8*   Urine analysis:    Component Value Date/Time   COLORURINE AMBER (A) 08/20/2019 1608   APPEARANCEUR CLEAR 08/20/2019 1608   LABSPEC 1.024 08/20/2019 1608   PHURINE 5.0 08/20/2019 1608   GLUCOSEU 50 (A) 08/20/2019 1608   HGBUR NEGATIVE 08/20/2019 1608   BILIRUBINUR NEGATIVE 08/20/2019 1608   KETONESUR 80 (A) 08/20/2019 1608   PROTEINUR NEGATIVE 08/20/2019 1608   UROBILINOGEN 0.2 08/26/2014 1425   NITRITE NEGATIVE 08/20/2019 1608   LEUKOCYTESUR NEGATIVE 08/20/2019 1608   Sepsis Labs: Invalid input(s): PROCALCITONIN, [place]  Microbiology: Recent Results (from the past 240 hour(s))  SARS Coronavirus 2 by RT PCR (hospital order, performed in [facility] hospital lab) Nasopharyngeal  Nasopharyngeal Swab     Status: None   Collection Time: 06/27/20 10:45 AM   Specimen: Nasopharyngeal Swab  Result Value Ref Range Status   SARS Coronavirus 2 NEGATIVE NEGATIVE Final    Comment: (NOTE) SARS-CoV-2 target nucleic acids are NOT DETECTED.  The SARS-CoV-2 RNA is generally detectable in upper and lower respiratory specimens during the acute phase of infection. The lowest concentration of SARS-CoV-2 viral copies this assay can detect is 250 copies / mL. A negative result does not preclude SARS-CoV-2 infection and should not be used as the sole basis for treatment or other patient management decisions.  A negative result may occur with improper specimen collection / handling, submission of specimen other than nasopharyngeal swab, presence of viral mutation(s) within the areas targeted by this assay, and inadequate number of viral copies (<250 copies / mL). A negative result must be combined with clinical observations, patient history, and epidemiological information.  Fact Sheet for Patients:   StrictlyIdeas.no  Fact Sheet for Healthcare Providers: BankingDealers.co.za  This test is not yet approved or  cleared by the [place] FDA and has been authorized for detection and/or diagnosis of SARS-CoV-2 by FDA under an Emergency Use Authorization (EUA).  This EUA will remain in effect (meaning this test can be used) for the duration of the COVID-19 declaration under Section 564(b)(1) of the Act, 21 U.S.C. section 360bbb-3(b)(1), unless the authorization is terminated or revoked sooner.  Performed at [facility], [place] [address], [place], [place] [address]     Radiology Studies: No results found.     Zyanna Leisinger T. Solano  If 7PM-7AM, please contact night-coverage [email or web address] 06/30/2020, 3:11 PM

## 2020-06-30 NOTE — Evaluation (Signed)
Physical Therapy Evaluation Patient Details Name: Danny Kerr MRN: [id number] DOB: [birth year] Today's Date: 06/30/2020   History of Present Illness  64 y.o. male with a history of alcoholism and admitted for alcohol withdrawal.  Clinical Impression  Pt admitted with above diagnosis.  Pt currently with functional limitations due to the deficits listed below (see PT Problem List). Pt will benefit from skilled PT to increase their independence and safety with mobility to allow discharge to the venue listed below.  Pt with tremulous UE and LEs throughout session.  Pt requiring at least min assist to ambulate short distance.  Pt appears surprised, upset, and depressed with current mobility status.  Pt is a high fall risk and not safe to d/c home alone.  Recommending SNF at this time.     Follow Up Recommendations SNF;Supervision/Assistance - 24 hour    Equipment Recommendations  Rolling walker with 5" wheels    Recommendations for Other Services       Precautions / Restrictions Precautions Precautions: Fall      Mobility  Bed Mobility Overal bed mobility: Needs Assistance Bed Mobility: Supine to Sit;Sit to Supine     Supine to sit: Min assist Sit to supine: Min assist   General bed mobility comments: assist for trunk upright, LEs onto bed  Transfers Overall transfer level: Needs assistance Equipment used: Rolling walker (2 wheeled) Transfers: Sit to/from Stand Sit to Stand: Min assist         General transfer comment: assist to rise and steady, control descent; pt attempting to sit down while turning so cues to back up to bed  Ambulation/Gait Ambulation/Gait assistance: Min assist;+2 safety/equipment Gait Distance (Feet): 50 Feet Assistive device: Rolling walker (2 wheeled) Gait Pattern/deviations: Step-through pattern;Decreased stride length;Wide base of support;Trunk flexed     General Gait Details: verbal cues for RW Positioning, assist for steadying and  negotiating RW especially with turns  Stairs            Wheelchair Mobility    Modified Rankin (Stroke Patients Only)       Balance Overall balance assessment: History of Falls;Needs assistance         Standing balance support: Bilateral upper extremity supported Standing balance-Leahy Scale: Poor                               Pertinent Vitals/Pain Pain Assessment: No/denies pain    Home Living Family/patient expects to be discharged to:: Unsure Living Arrangements: Alone   Type of Home: House Home Access: Stairs to enter Entrance Stairs-Rails: Right Entrance Stairs-Number of Steps: 3 Home Layout: Two level;Bed/bath upstairs Home Equipment: None      Prior Function Level of Independence: Independent               Hand Dominance        Extremity/Trunk Assessment        Lower Extremity Assessment Lower Extremity Assessment: Generalized weakness    Cervical / Trunk Assessment Cervical / Trunk Assessment: Normal  Communication   Communication: No difficulties  Cognition Arousal/Alertness: Awake/alert Behavior During Therapy: WFL for tasks assessed/performed Overall Cognitive Status: No family/caregiver present to determine baseline cognitive functioning                                 General Comments: alert and orientated, surprised by his poor mobility today  General Comments      Exercises     Assessment/Plan    PT Assessment Patient needs continued PT services  PT Problem List Decreased strength;Decreased mobility;Decreased balance;Decreased activity tolerance;Decreased knowledge of use of DME       PT Treatment Interventions DME instruction;Therapeutic exercise;Gait training;Balance training;Functional mobility training;Therapeutic activities;Patient/family education    PT Goals (Current goals can be found in the Care Plan section)  Acute Rehab PT Goals PT Goal Formulation: With patient Time  For Goal Achievement: 07/14/20 Potential to Achieve Goals: Good    Frequency Min 3X/week   Barriers to discharge        Co-evaluation               AM-PAC PT "6 Clicks" Mobility  Outcome Measure Help needed turning from your back to your side while in a flat bed without using bedrails?: A Little Help needed moving from lying on your back to sitting on the side of a flat bed without using bedrails?: A Little Help needed moving to and from a bed to a chair (including a wheelchair)?: A Little Help needed standing up from a chair using your arms (e.g., wheelchair or bedside chair)?: A Little Help needed to walk in hospital room?: A Lot Help needed climbing 3-5 steps with a railing? : A Lot 6 Click Score: 16    End of Session Equipment Utilized During Treatment: Gait belt Activity Tolerance: Patient tolerated treatment well Patient left: in bed;with call bell/phone within reach;with bed alarm set   PT Visit Diagnosis: Unsteadiness on feet (R26.81);Other abnormalities of gait and mobility (R26.89)    Time: 5697-9480 PT Time Calculation (min) (ACUTE ONLY): 19 min   Charges:   PT Evaluation $PT Eval Low Complexity: 1 Low         Kati PT, DPT Acute Rehabilitation Services Pager: [phone] Office: [phone]  York Ram E 06/30/2020, 4:00 PM

## 2020-06-30 NOTE — Progress Notes (Signed)
Initial Nutrition Assessment  INTERVENTION:   -Ensure Enlive po BID, each supplement provides 350 kcal and 20 grams of protein -Magic cup BID with meals, each supplement provides 290 kcal and 9 grams of protein  NUTRITION DIAGNOSIS:   Increased nutrient needs related to  (ETOH abuse) as evidenced by estimated needs.  GOAL:   Patient will meet greater than or equal to 90% of their needs  MONITOR:   PO intake, Supplement acceptance, Labs, Weight trends, I & O's  REASON FOR ASSESSMENT:   Consult Assessment of nutrition requirement/status  ASSESSMENT:   64 y.o. male with a history of alcoholism who presented on the morning of 9/6 by EMS due to alcohol withdrawal having had his last drink of vodka the prior evening. He was hemodynamically stable, afebrile, with alcohol level of 133. K 3.3, magnesium 1.7, platelets 106, TBili 1.3. He was given ativan, normal saline, vitamins, and admitted for alcohol withdrawal on CIWA protocol.  Patient with history of chronic alcoholism. Pt last drank alcohol on 9/5, pt typically drinks a quart of liquor daily. Currently going through withdrawal with tremors and hallucinations. Currently consuming 50-75% of meals. Would benefit from nutritional supplements.  Per weight records, pt's weight is stable.  Medications: Folic acid, Multivitamin with minerals daily, KLOR-CON, Vitamin B-12, IV Thiamine  Labs reviewed:  Low Na Mg/Phos WNL  NUTRITION - FOCUSED PHYSICAL EXAM:  Unable to complete  Diet Order:   Diet Order            Diet Heart Room service appropriate? Yes; Fluid consistency: Thin  Diet effective now                 EDUCATION NEEDS:   No education needs have been identified at this time  Skin:  Skin Assessment: Reviewed RN Assessment  Last BM:  9/8  Height:   Ht Readings from Last 1 Encounters:  06/27/20 6\' 3"  (1.905 m)    Weight:   Wt Readings from Last 1 Encounters:  06/27/20 86.2 kg   BMI:  Body mass index  is 23.75 kg/m.  Estimated Nutritional Needs:   Kcal:  2300-2500  Protein:  120-130g  Fluid:  2.3L/day  Clayton Bibles, MS, RD, LDN Inpatient Clinical Dietitian Contact information available via Amion

## 2020-07-01 LAB — COMPREHENSIVE METABOLIC PANEL
ALT: 13 U/L (ref 0–44)
AST: 31 U/L (ref 15–41)
Albumin: 3.8 g/dL (ref 3.5–5.0)
Alkaline Phosphatase: 105 U/L (ref 38–126)
Anion gap: 9 (ref 5–15)
BUN: 8 mg/dL (ref 8–23)
CO2: 27 mmol/L (ref 22–32)
Calcium: 9.3 mg/dL (ref 8.9–10.3)
Chloride: 99 mmol/L (ref 98–111)
Creatinine, Ser: 0.52 mg/dL — ABNORMAL LOW (ref 0.61–1.24)
GFR calc Af Amer: >60 mL/min (ref >60)
GFR calc non Af Amer: >60 mL/min (ref >60)
Glucose, Bld: 100 mg/dL — ABNORMAL HIGH (ref 70–99)
Potassium: 4.5 mmol/L (ref 3.5–5.1)
Sodium: 135 mmol/L (ref 135–145)
Total Bilirubin: 1.5 mg/dL — ABNORMAL HIGH (ref 0.3–1.2)
Total Protein: 6.9 g/dL (ref 6.5–8.1)

## 2020-07-01 LAB — CBC
HCT: 38.7 % — ABNORMAL LOW (ref 39.0–52.0)
Hemoglobin: 12.9 g/dL — ABNORMAL LOW (ref 13.0–17.0)
MCH: 33.9 pg (ref 26.0–34.0)
MCHC: 33.3 g/dL (ref 30.0–36.0)
MCV: 101.6 fL — ABNORMAL HIGH (ref 80.0–100.0)
Platelets: 139 10*3/uL — ABNORMAL LOW (ref 150–400)
RBC: 3.81 MIL/uL — ABNORMAL LOW (ref 4.22–5.81)
RDW: 14.4 % (ref 11.5–15.5)
WBC: 4.3 10*3/uL (ref 4.0–10.5)
nRBC: 0 % (ref 0.0–0.2)

## 2020-07-01 LAB — PHOSPHORUS: Phosphorus: 4.1 mg/dL (ref 2.5–4.6)

## 2020-07-01 LAB — MAGNESIUM: Magnesium: 1.9 mg/dL (ref 1.7–2.4)

## 2020-07-01 MED ORDER — LOPERAMIDE HCL 2 MG PO CAPS: 2.0000 mg | ORAL_CAPSULE | ORAL | Status: AC | PRN

## 2020-07-01 MED ORDER — LORAZEPAM 1 MG PO TABS
1.0000 mg | ORAL_TABLET | ORAL | Status: AC | PRN
  Administered 2020-07-02: 1 mg via ORAL
  Administered 2020-07-02: 2 mg via ORAL
  Administered 2020-07-02: 1 mg via ORAL
  Administered 2020-07-03: 2 mg via ORAL
  Administered 2020-07-03: 1 mg via ORAL
  Filled 2020-07-01: qty 2
  Filled 2020-07-01: qty 1
  Filled 2020-07-01: qty 2
  Filled 2020-07-01: qty 1
  Filled 2020-07-01: qty 2

## 2020-07-01 MED ORDER — HYDROXYZINE HCL 25 MG PO TABS: 25.0000 mg | ORAL_TABLET | Freq: Four times a day (QID) | ORAL | Status: DC | PRN

## 2020-07-01 MED ORDER — LORAZEPAM 2 MG/ML IJ SOLN
1.0000 mg | INTRAMUSCULAR | Status: AC | PRN
  Administered 2020-07-01 – 2020-07-02 (×3): 2 mg via INTRAVENOUS
  Filled 2020-07-01 (×3): qty 1

## 2020-07-01 NOTE — TOC Progression Note (Signed)
Transition of Care [facility]) - Progression Note    Patient Details  Name: Danny Kerr MRN: [id number] Date of Birth: Apr 14, 1956  Transition of Care [facility]) CM/SW Contact  Purcell Mouton, RN Phone Number: 07/01/2020, 3:49 PM  Clinical Narrative:    Spoke with pt's wife in length concerning discharge plans. 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 Wife continues: pt lives alone, he is not safe, he has fallen, not able to care for himself, he has been to [employer], and a number of other places, pt continued to drink. Wife is asking if pt can go to [facility]. Explained that Psychi would need to see pt and recommend [facility]. Also Explained that this was up to MD.     Expected Discharge Plan: [address] Barriers to Discharge: No Barriers Identified  Expected Discharge Plan and Services Expected Discharge Plan: [place] arrangements for the past 2 months: Single Family Home                                       Social Determinants of Health (SDOH) Interventions    Readmission Risk Interventions No flowsheet data found.

## 2020-07-01 NOTE — Progress Notes (Signed)
PROGRESS NOTE  Danny Kerr ISENSEE FTD:[id number] DOB: [birth year]   PCP: [name], MD  Patient is from: Home.  DOA: 06/27/2020 LOS: 3  Brief Narrative / Interim history: 64 y.o. male with a history of alcoholism who presented on the morning of 9/6 by EMS due to alcohol withdrawal having had his last drink of vodka the prior evening. He was hemodynamically stable, afebrile, with alcohol level of 133. K 3.3, magnesium 1.7, platelets 106, TBili 1.3. He was given ativan, normal saline, vitamins, and admitted for alcohol withdrawal on CIWA protocol.   Patient is on Librium taper with as needed Ativan. Also started on high-dose IV thiamine.  Subjective: Seen and examined earlier this morning.  No major events overnight of this morning.  No complaints.  Feels better.  Still with significant tremor with intention/activity.  Denies audiovisual hallucination.  He is undecided about therapy recommendation of SNF and hoping to go home when he is medically ready.  CIWA score ranges from 0-10 overnight.  Most recent 3.  Objective: Vitals:   06/30/20 2111 07/01/20 0401 07/01/20 0450 07/01/20 0848  BP: 121/89 (!) 131/91 (!) 145/89 123/77  Pulse: 92 74 76 80  Resp: 18  16 14   Temp: 97.8 F (36.6 C) 98.3 F (36.8 C) 98.3 F (36.8 C)   TempSrc: Oral Oral Oral   SpO2: 98%  100% 100%  Weight:      Height:        Intake/Output Summary (Last 24 hours) at 07/01/2020 1231 Last data filed at 07/01/2020 1111 Gross per 24 hour  Intake 200 ml  Output 1525 ml  Net -1325 ml   Filed Weights   06/27/20 0659  Weight: 86.2 kg    Examination:  GENERAL: No apparent distress.  Nontoxic. HEENT: MMM.  Vision and hearing grossly intact.  NECK: Supple.  No apparent JVD.  RESP: On room air.  No IWOB.  Fair aeration bilaterally. CVS:  RRR. Heart sounds normal.  ABD/GI/GU: BS+. Abd soft, NTND.  MSK/EXT:  Moves extremities. No apparent deformity. No edema.  SKIN: no apparent skin lesion or wound NEURO:  Awake, alert and oriented appropriately.  Intention tremor noted.  No apparent focal neuro deficit. PSYCH: Calm. Normal affect.   Procedures:  None  Microbiology summarized: COVID-19 PCR negative.  Assessment & Plan: Alcoholism/alcohol withdrawal-reportedly drinks a quart of vodka per day.  Attempted multiple rehabilitation programs without significant success.  Now with tremors, visual hallucination and weakness. CIWA score improving. Most recent 8 earlier this morning.  -Continue Librium taper with as needed Ativan per CIWA protocol -Continue folic acid, multivitamin and vitamin B12 -TOC consulted for resources and SNF  Cognitive decline: patient wife RN in ambulatory setting concerned about possible Wernicke/Korasakoff after reading online.  He hasn't had MRI brain.  Obviously he is at risk.  -Continue high-dose thiamine for the next 3 days  Elevated liver enzymes/hyperbilirubinemia-likely alcoholic hepatitis.  Resolved except for elevated bilirubin. Total bili 2.2 (1.7 and direct). This seems to be chronic back to 2020. With a stable H&H, there could be some abnormalities with conjugation -Continue monitoring  Hypokalemia: Resolved.  Mild hyponatremia: Na 134. Could be due to alcohol.  Resolving. -Continue monitoring  Normocytic anemia: Likely from alcohol.  H&H relatively stable.  Anemia panel basically normal. -Continue monitoring  Thrombocytopenia: Platelet 107> 139.  Likely due to alcohol.  Improving. -Continue monitoring  Generalized weakness/debility: per wife, they are separated and he lives by himself. She doesn't feel he can take care of  himself. She noted blood everywhere when she went to check on him at his apartment before this admission. She thinks he might fell.  -Start p.o. vitamin B12 1000 mcg daily -PT/OT recommended SNF which is reasonable but patient is undecided yet.  Anxiety: -Continue home Zoloft. -Also on as needed Ativan  Body mass index is 23.75  kg/m. Nutrition Problem: Increased nutrient needs Etiology:  (ETOH abuse) Signs/Symptoms: estimated needs Interventions: Ensure Enlive (each supplement provides 350kcal and 20 grams of protein), Magic cup   DVT prophylaxis:  enoxaparin (LOVENOX) injection 40 mg Start: 06/27/20 2200  Code Status: Full code Family Communication: Updated patient's wife over the phone Status is: Inpatient  Remains inpatient appropriate because:IV treatments appropriate due to intensity of illness or inability to take PO, Inpatient level of care appropriate due to severity of illness and Significant alcohol withdrawal symptoms with elevated CIWA scores   Dispo: The patient is from: Home              Anticipated d/c is to: SNF              Anticipated d/c date is: 2 days              Patient currently is not medically stable to d/c.       Consultants:  None    Sch Meds:  Scheduled Meds: . chlordiazePOXIDE  25 mg Oral Daily  . enoxaparin (LOVENOX) injection  40 mg Subcutaneous Q24H  . feeding supplement (ENSURE ENLIVE)  237 mL Oral BID BM  . folic acid  1 mg Oral Daily  . multivitamin with minerals  1 tablet Oral Daily  . potassium chloride  40 mEq Oral BID  . sertraline  50 mg Oral Daily  . vitamin B-12  1,000 mcg Oral Daily   Continuous Infusions: . thiamine injection 500 mg (07/01/20 0842)   PRN Meds:.hydrOXYzine, loperamide, LORazepam **OR** LORazepam, metoprolol tartrate, ondansetron **OR** ondansetron (ZOFRAN) IV, polyvinyl alcohol  Antimicrobials: Anti-infectives (From admission, onward)   None       I have personally reviewed the following labs and images: CBC: Recent Labs  Lab 06/27/20 0725 06/28/20 0309 06/29/20 0508 07/01/20 0456  WBC 4.5 5.4 5.1 4.3  HGB 12.7* 12.5* 13.1 12.9*  HCT 36.3* 36.5* 38.0* 38.7*  MCV 97.8 99.5 99.2 101.6*  PLT 106* 84* 107* 139*   BMP &GFR Recent Labs  Lab 06/27/20 0725 06/28/20 0309 06/29/20 0508 06/30/20 0513 07/01/20 0456   NA 138 136 139 134* 135  K 3.3* 3.2* 4.0 4.0 4.5  CL 101 101 103 102 99  CO2 23 24 22 24 27   GLUCOSE 95 90 105* 101* 100*  BUN 8 9 10 11 8   CREATININE 0.50* 0.52* 0.54* 0.56* 0.52*  CALCIUM 8.7* 8.8* 9.3 9.1 9.3  MG 1.7  --  2.0 1.8 1.9  PHOS 2.8  --  3.1 3.3 4.1   Estimated Creatinine Clearance: 110 mL/min (A) (by C-G formula based on SCr of 0.52 mg/dL (L)). Liver & Pancreas: Recent Labs  Lab 06/27/20 0725 06/28/20 0309 06/29/20 0508 06/30/20 0513 07/01/20 0456  AST 53* 39 30  --  31  ALT 16 14 14   --  13  ALKPHOS 118 103 111  --  105  BILITOT 1.3* 2.2* 2.2*  2.2*  --  1.5*  PROT 7.2 6.8 7.1  --  6.9  ALBUMIN 4.4 3.9 3.9 3.6 3.8   No results for input(s): LIPASE, AMYLASE in the last 168 hours. Recent Labs  Lab 06/30/20 0513  AMMONIA 15   Diabetic: No results for input(s): HGBA1C in the last 72 hours. No results for input(s): GLUCAP in the last 168 hours. Cardiac Enzymes: No results for input(s): CKTOTAL, CKMB, CKMBINDEX, TROPONINI in the last 168 hours. No results for input(s): PROBNP in the last 8760 hours. Coagulation Profile: No results for input(s): INR, PROTIME in the last 168 hours. Thyroid Function Tests: No results for input(s): TSH, T4TOTAL, FREET4, T3FREE, THYROIDAB in the last 72 hours. Lipid Profile: No results for input(s): CHOL, HDL, LDLCALC, TRIG, CHOLHDL, LDLDIRECT in the last 72 hours. Anemia Panel: Recent Labs    06/29/20 1115  VITAMINB12 224  FOLATE 19.9  FERRITIN 1,248*  TIBC 256  IRON 45  RETICCTPCT 3.8*   Urine analysis:    Component Value Date/Time   COLORURINE AMBER (A) 08/20/2019 1608   APPEARANCEUR CLEAR 08/20/2019 1608   LABSPEC 1.024 08/20/2019 1608   PHURINE 5.0 08/20/2019 1608   GLUCOSEU 50 (A) 08/20/2019 1608   HGBUR NEGATIVE 08/20/2019 1608   BILIRUBINUR NEGATIVE 08/20/2019 1608   KETONESUR 80 (A) 08/20/2019 1608   PROTEINUR NEGATIVE 08/20/2019 1608   UROBILINOGEN 0.2 08/26/2014 1425   NITRITE NEGATIVE 08/20/2019  1608   LEUKOCYTESUR NEGATIVE 08/20/2019 1608   Sepsis Labs: Invalid input(s): PROCALCITONIN, [place]  Microbiology: Recent Results (from the past 240 hour(s))  SARS Coronavirus 2 by RT PCR (hospital order, performed in [facility] hospital lab) Nasopharyngeal Nasopharyngeal Swab     Status: None   Collection Time: 06/27/20 10:45 AM   Specimen: Nasopharyngeal Swab  Result Value Ref Range Status   SARS Coronavirus 2 NEGATIVE NEGATIVE Final    Comment: (NOTE) SARS-CoV-2 target nucleic acids are NOT DETECTED.  The SARS-CoV-2 RNA is generally detectable in upper and lower respiratory specimens during the acute phase of infection. The lowest concentration of SARS-CoV-2 viral copies this assay can detect is 250 copies / mL. A negative result does not preclude SARS-CoV-2 infection and should not be used as the sole basis for treatment or other patient management decisions.  A negative result may occur with improper specimen collection / handling, submission of specimen other than nasopharyngeal swab, presence of viral mutation(s) within the areas targeted by this assay, and inadequate number of viral copies (<250 copies / mL). A negative result must be combined with clinical observations, patient history, and epidemiological information.  Fact Sheet for Patients:   StrictlyIdeas.no  Fact Sheet for Healthcare Providers: BankingDealers.co.za  This test is not yet approved or  cleared by the [place] FDA and has been authorized for detection and/or diagnosis of SARS-CoV-2 by FDA under an Emergency Use Authorization (EUA).  This EUA will remain in effect (meaning this test can be used) for the duration of the COVID-19 declaration under Section 564(b)(1) of the Act, 21 U.S.C. section 360bbb-3(b)(1), unless the authorization is terminated or revoked sooner.  Performed at [facility], [place] [address], [place], [place] [address]     Radiology Studies: No results found.     Tyneshia Stivers T. [place]  If 7PM-7AM, please contact night-coverage [email or web address] 07/01/2020, 12:31 PM

## 2020-07-02 MED ORDER — THIAMINE HCL 100 MG PO TABS
100.0000 mg | ORAL_TABLET | Freq: Every day | ORAL | Status: DC
  Administered 2020-07-02 – 2020-07-06 (×5): 100 mg via ORAL
  Filled 2020-07-02 (×5): qty 1

## 2020-07-02 NOTE — Progress Notes (Signed)
Physical Therapy Treatment Patient Details Name: Danny Kerr MRN: [id number] DOB: 04/17/1956 Today's Date: 07/02/2020    History of Present Illness 64 y.o. male with a history of alcoholism and admitted for alcohol withdrawal.    PT Comments    Pt assisted with ambulating in hallway and drifts to left.  Pt requiring at least min assist for stabilizing and maneuvering RW.  Continue to recommend SNF upon d/c.    Follow Up Recommendations  SNF;Supervision/Assistance - 24 hour     Equipment Recommendations  Rolling walker with 5" wheels    Recommendations for Other Services       Precautions / Restrictions Precautions Precautions: Fall    Mobility  Bed Mobility Overal bed mobility: Needs Assistance Bed Mobility: Supine to Sit;Sit to Supine     Supine to sit: Min guard Sit to supine: Min assist   General bed mobility comments: assist for LEs onto bed  Transfers Overall transfer level: Needs assistance Equipment used: Rolling walker (2 wheeled) Transfers: Sit to/from Stand Sit to Stand: Min assist         General transfer comment: assist to rise and steady, control descent; pt sitting down on end of bed despite cues to go up towards [facility] (likely due to fatigue although he denies fatigue)  Ambulation/Gait Ambulation/Gait assistance: Min assist Gait Distance (Feet): 120 Feet Assistive device: Rolling walker (2 wheeled) Gait Pattern/deviations: Step-through pattern;Decreased stride length;Wide base of support;Trunk flexed;Drifts right/left     General Gait Details: verbal cues for RW Positioning, assist for steadying and negotiating RW especially with turns; pt drifts to left and unable to correct without assist   Stairs             Wheelchair Mobility    Modified Rankin (Stroke Patients Only)       Balance Overall balance assessment: History of Falls;Needs assistance         Standing balance support: Bilateral upper extremity  supported Standing balance-Leahy Scale: Poor                              Cognition Arousal/Alertness: Awake/alert Behavior During Therapy: Flat affect Overall Cognitive Status: No family/caregiver present to determine baseline cognitive functioning                                        Exercises      General Comments        Pertinent Vitals/Pain Pain Assessment: No/denies pain    Home Living                      Prior Function            PT Goals (current goals can now be found in the care plan section) Progress towards PT goals: Progressing toward goals    Frequency    Min 3X/week      PT Plan Current plan remains appropriate    Co-evaluation              AM-PAC PT "6 Clicks" Mobility   Outcome Measure  Help needed turning from your back to your side while in a flat bed without using bedrails?: A Little Help needed moving from lying on your back to sitting on the side of a flat bed without using bedrails?: A Little Help needed moving to and from  a bed to a chair (including a wheelchair)?: A Little Help needed standing up from a chair using your arms (e.g., wheelchair or bedside chair)?: A Little Help needed to walk in hospital room?: A Lot Help needed climbing 3-5 steps with a railing? : A Lot 6 Click Score: 16    End of Session Equipment Utilized During Treatment: Gait belt Activity Tolerance: Patient tolerated treatment well Patient left: in bed;with call bell/phone within reach;with bed alarm set Nurse Communication: Mobility status PT Visit Diagnosis: Unsteadiness on feet (R26.81);Other abnormalities of gait and mobility (R26.89)     Time: 8264-1583 PT Time Calculation (min) (ACUTE ONLY): 19 min  Charges:  $Gait Training: 8-22 mins                    Danny Kerr, DPT Acute Rehabilitation Services Pager: [phone] Office: [phone]  Danny Kerr 07/02/2020, 4:13 PM

## 2020-07-02 NOTE — Progress Notes (Signed)
PROGRESS NOTE  Danny Kerr EVO:[id number] DOB: [birth year]   PCP: [name], MD  Patient is from: Home.  DOA: 06/27/2020 LOS: 4  Brief Narrative / Interim history: 64 y.o. male with a history of alcoholism who presented on the morning of 9/6 by EMS due to alcohol withdrawal having had his last drink of vodka the prior evening. He was hemodynamically stable, afebrile, with alcohol level of 133. K 3.3, magnesium 1.7, platelets 106, TBili 1.3. He was given ativan, normal saline, vitamins, and admitted for alcohol withdrawal on CIWA protocol.   Patient is on Librium taper with as needed Ativan.  He is a stable for discharge to SNF once bed available.  Subjective: Seen and examined earlier this morning.  No major events overnight or this morning.  He does not recall talking to his wife who visited yesterday.  CIWA score ranges from 4-11 overnight.  He appears calm this except for intention tremor.  He has no complaints.   Objective: Vitals:   07/01/20 2121 07/01/20 2351 07/02/20 0356 07/02/20 0956  BP: 128/70 125/86 127/78 108/70  Pulse: 63 86 75 76  Resp: 19 18 17    Temp: 98.4 F (36.9 C) 99 F (37.2 C) 98.9 F (37.2 C)   TempSrc: Oral Oral Oral   SpO2: 98% 99% 100%   Weight:      Height:        Intake/Output Summary (Last 24 hours) at 07/02/2020 1209 Last data filed at 07/02/2020 1043 Gross per 24 hour  Intake 1590 ml  Output 1700 ml  Net -110 ml   Filed Weights   06/27/20 0659  Weight: 86.2 kg    Examination:  GENERAL: No apparent distress.  Nontoxic. HEENT: MMM.  Vision and hearing grossly intact.  NECK: Supple.  No apparent JVD.  RESP:  No IWOB.  Fair aeration bilaterally. CVS:  RRR. Heart sounds normal.  ABD/GI/GU: BS+. Abd soft, NTND.  MSK/EXT:  Moves extremities. No apparent deformity. No edema.  SKIN: no apparent skin lesion or wound NEURO: Awake, alert and oriented appropriately.  Intention tremors.  No apparent focal neuro deficit. PSYCH: Calm.  Normal affect.  Procedures:  None  Microbiology summarized: COVID-19 PCR negative.  Assessment & Plan: Alcoholism/alcohol withdrawal-reportedly drinks a quart of vodka per day.  Attempted multiple rehabilitation programs without significant success.  Now with tremors, visual hallucination and weakness. CIWA score improving.  -Continue Librium taper with as needed Ativan per CIWA protocol -Continue folic acid, multivitamin and vitamin B12 -TOC consulted for resources and SNF  Cognitive decline: patient wife RN in ambulatory setting concerned about possible Wernicke/Korasakoff after reading online.  He hasn't had MRI brain.  Obviously he is at risk.  He had 3/3 on Mini-Mental exam both immediate and delayed but could not recall his ex-wife's visitation 24 hours later.  Completed high-dose IV thiamine for 3 days. -Reorientation and delirium precautions -Continue p.o. thiamine.  Elevated liver enzymes/hyperbilirubinemia-likely alcoholic hepatitis.  Resolved except for elevated bilirubin. Total bili 2.2 (1.7 and direct). This seems to be chronic back to 2020. With a stable H&H, there could be some abnormalities with conjugation.   Hypokalemia: Resolved.  Mild hyponatremia: Na 134. Could be due to alcohol.  Resolving. -Continue monitoring  Normocytic anemia: Likely from alcohol.  H&H relatively stable.  Anemia panel basically normal. -Continue monitoring  Thrombocytopenia: Platelet 107> 139.  Likely due to alcohol.  Improving. -Continue monitoring  Generalized weakness/debility: per wife, they are separated and he lives by himself. She doesn't  feel he can take care of himself. She noted blood everywhere when she went to check on him at his apartment before this admission. She thinks he might fell.  -Continue p.o. vitamin B12 1000 mcg daily -PT/OT recommended SNF which is reasonable  Anxiety: -Continue home Zoloft. -Also on as needed Ativan  Body mass index is 23.75 kg/m. Nutrition  Problem: Increased nutrient needs Etiology:  (ETOH abuse) Signs/Symptoms: estimated needs Interventions: Ensure Enlive (each supplement provides 350kcal and 20 grams of protein), Magic cup   DVT prophylaxis:  enoxaparin (LOVENOX) injection 40 mg Start: 06/27/20 2200  Code Status: Full code Family Communication: Updated patient's ex-wife over the phone with patient's permission on 9/10. Status is: Inpatient  Remains inpatient appropriate because:IV treatments appropriate due to intensity of illness or inability to take PO, Inpatient level of care appropriate due to severity of illness and Significant alcohol withdrawal symptoms with elevated CIWA scores   Dispo: The patient is from: Home              Anticipated d/c is to: SNF              Anticipated d/c date is: 1 day              Patient currently is not medically stable to d/c.       Consultants:  None    Sch Meds:  Scheduled Meds: . enoxaparin (LOVENOX) injection  40 mg Subcutaneous Q24H  . feeding supplement (ENSURE ENLIVE)  237 mL Oral BID BM  . folic acid  1 mg Oral Daily  . multivitamin with minerals  1 tablet Oral Daily  . potassium chloride  40 mEq Oral BID  . sertraline  50 mg Oral Daily  . vitamin B-12  1,000 mcg Oral Daily   Continuous Infusions:  PRN Meds:.hydrOXYzine, loperamide, LORazepam **OR** LORazepam, metoprolol tartrate, ondansetron **OR** ondansetron (ZOFRAN) IV, polyvinyl alcohol  Antimicrobials: Anti-infectives (From admission, onward)   None       I have personally reviewed the following labs and images: CBC: Recent Labs  Lab 06/27/20 0725 06/28/20 0309 06/29/20 0508 07/01/20 0456  WBC 4.5 5.4 5.1 4.3  HGB 12.7* 12.5* 13.1 12.9*  HCT 36.3* 36.5* 38.0* 38.7*  MCV 97.8 99.5 99.2 101.6*  PLT 106* 84* 107* 139*   BMP &GFR Recent Labs  Lab 06/27/20 0725 06/28/20 0309 06/29/20 0508 06/30/20 0513 07/01/20 0456  NA 138 136 139 134* 135  K 3.3* 3.2* 4.0 4.0 4.5  CL 101 101 103  102 99  CO2 23 24 22 24 27   GLUCOSE 95 90 105* 101* 100*  BUN 8 9 10 11 8   CREATININE 0.50* 0.52* 0.54* 0.56* 0.52*  CALCIUM 8.7* 8.8* 9.3 9.1 9.3  MG 1.7  --  2.0 1.8 1.9  PHOS 2.8  --  3.1 3.3 4.1   Estimated Creatinine Clearance: 110 mL/min (A) (by C-G formula based on SCr of 0.52 mg/dL (L)). Liver & Pancreas: Recent Labs  Lab 06/27/20 0725 06/28/20 0309 06/29/20 0508 06/30/20 0513 07/01/20 0456  AST 53* 39 30  --  31  ALT 16 14 14   --  13  ALKPHOS 118 103 111  --  105  BILITOT 1.3* 2.2* 2.2*  2.2*  --  1.5*  PROT 7.2 6.8 7.1  --  6.9  ALBUMIN 4.4 3.9 3.9 3.6 3.8   No results for input(s): LIPASE, AMYLASE in the last 168 hours. Recent Labs  Lab 06/30/20 0513  AMMONIA 15   Diabetic:  No results for input(s): HGBA1C in the last 72 hours. No results for input(s): GLUCAP in the last 168 hours. Cardiac Enzymes: No results for input(s): CKTOTAL, CKMB, CKMBINDEX, TROPONINI in the last 168 hours. No results for input(s): PROBNP in the last 8760 hours. Coagulation Profile: No results for input(s): INR, PROTIME in the last 168 hours. Thyroid Function Tests: No results for input(s): TSH, T4TOTAL, FREET4, T3FREE, THYROIDAB in the last 72 hours. Lipid Profile: No results for input(s): CHOL, HDL, LDLCALC, TRIG, CHOLHDL, LDLDIRECT in the last 72 hours. Anemia Panel: No results for input(s): VITAMINB12, FOLATE, FERRITIN, TIBC, IRON, RETICCTPCT in the last 72 hours. Urine analysis:    Component Value Date/Time   COLORURINE AMBER (A) 08/20/2019 1608   APPEARANCEUR CLEAR 08/20/2019 1608   LABSPEC 1.024 08/20/2019 1608   PHURINE 5.0 08/20/2019 1608   GLUCOSEU 50 (A) 08/20/2019 1608   HGBUR NEGATIVE 08/20/2019 1608   BILIRUBINUR NEGATIVE 08/20/2019 1608   KETONESUR 80 (A) 08/20/2019 1608   PROTEINUR NEGATIVE 08/20/2019 1608   UROBILINOGEN 0.2 08/26/2014 1425   NITRITE NEGATIVE 08/20/2019 1608   LEUKOCYTESUR NEGATIVE 08/20/2019 1608   Sepsis Labs: Invalid input(s):  PROCALCITONIN, [place]  Microbiology: Recent Results (from the past 240 hour(s))  SARS Coronavirus 2 by RT PCR (hospital order, performed in [facility] hospital lab) Nasopharyngeal Nasopharyngeal Swab     Status: None   Collection Time: 06/27/20 10:45 AM   Specimen: Nasopharyngeal Swab  Result Value Ref Range Status   SARS Coronavirus 2 NEGATIVE NEGATIVE Final    Comment: (NOTE) SARS-CoV-2 target nucleic acids are NOT DETECTED.  The SARS-CoV-2 RNA is generally detectable in upper and lower respiratory specimens during the acute phase of infection. The lowest concentration of SARS-CoV-2 viral copies this assay can detect is 250 copies / mL. A negative result does not preclude SARS-CoV-2 infection and should not be used as the sole basis for treatment or other patient management decisions.  A negative result may occur with improper specimen collection / handling, submission of specimen other than nasopharyngeal swab, presence of viral mutation(s) within the areas targeted by this assay, and inadequate number of viral copies (<250 copies / mL). A negative result must be combined with clinical observations, patient history, and epidemiological information.  Fact Sheet for Patients:   StrictlyIdeas.no  Fact Sheet for Healthcare Providers: BankingDealers.co.za  This test is not yet approved or  cleared by the [place] FDA and has been authorized for detection and/or diagnosis of SARS-CoV-2 by FDA under an Emergency Use Authorization (EUA).  This EUA will remain in effect (meaning this test can be used) for the duration of the COVID-19 declaration under Section 564(b)(1) of the Act, 21 U.S.C. section 360bbb-3(b)(1), unless the authorization is terminated or revoked sooner.  Performed at [facility], [place] [address] [place], [place] [address]     Radiology Studies: No results found.     Lashawnda Hancox T.  [place]  If 7PM-7AM, please contact night-coverage [email or web address] 07/02/2020, 12:09 PM

## 2020-07-02 NOTE — Progress Notes (Signed)
Returned phone call to patient's wife [name] with updates.

## 2020-07-03 LAB — BASIC METABOLIC PANEL
Anion gap: 12 (ref 5–15)
BUN: 11 mg/dL (ref 8–23)
CO2: 28 mmol/L (ref 22–32)
Calcium: 9.4 mg/dL (ref 8.9–10.3)
Chloride: 98 mmol/L (ref 98–111)
Creatinine, Ser: 0.64 mg/dL (ref 0.61–1.24)
GFR calc Af Amer: >60 mL/min (ref >60)
GFR calc non Af Amer: >60 mL/min (ref >60)
Glucose, Bld: 101 mg/dL — ABNORMAL HIGH (ref 70–99)
Potassium: 4.2 mmol/L (ref 3.5–5.1)
Sodium: 138 mmol/L (ref 135–145)

## 2020-07-03 LAB — MAGNESIUM: Magnesium: 2.1 mg/dL (ref 1.7–2.4)

## 2020-07-03 NOTE — Progress Notes (Signed)
Physical Therapy Treatment Patient Details Name: Danny Kerr MRN: [id number] DOB: 11/17/1955 Today's Date: 07/03/2020    History of Present Illness 64 y.o. male with a history of alcoholism and admitted for alcohol withdrawal.    PT Comments    Pt progressing, incr gait distance today. Pt expresses frustration regarding decr coordination ("shakes"), discussed this is part of the w/d process despite meds and importance of continued mobilization. Pt is motivated to work with PT ( the aptly named  "fall mat"  in pt's room is incr his fall risk when mobilizing)  Follow Up Recommendations  SNF;Supervision/Assistance - 24 hour     Equipment Recommendations  Rolling walker with 5" wheels    Recommendations for Other Services       Precautions / Restrictions Precautions Precautions: Fall Restrictions Weight Bearing Restrictions: No    Mobility  Bed Mobility Overal bed mobility: Needs Assistance Bed Mobility: Supine to Sit;Sit to Supine     Supine to sit: Min guard Sit to supine: Min assist   General bed mobility comments: assist for LEs onto bed  Transfers Overall transfer level: Needs assistance Equipment used: Rolling walker (2 wheeled) Transfers: Sit to/from Stand Sit to Stand: Min assist         General transfer comment: assist to rise and steady, control descent; pt sitting down on end of bed despite cues to go up towards [facility] (likely due to fatigue) acknowledges fatigue today  Ambulation/Gait Ambulation/Gait assistance: Min assist Gait Distance (Feet): 200 Feet Assistive device: Rolling walker (2 wheeled) Gait Pattern/deviations: Step-through pattern;Decreased stride length;Wide base of support;Trunk flexed;Drifts right/left     General Gait Details: verbal cues for RW Positioning, assist for steadying and negotiating RW especially with turns; pt drifts to left and unable to correct without assist   Stairs             Wheelchair Mobility     Modified Rankin (Stroke Patients Only)       Balance                                            Cognition Arousal/Alertness: Awake/alert Behavior During Therapy: Flat affect,mildly impulsive at times  Overall Cognitive Status: Within Functional Limits for tasks assessed                                 General Comments: alert and oriented, expresses frustration with poor coordination      Exercises      General Comments        Pertinent Vitals/Pain Pain Assessment: No/denies pain    Home Living                      Prior Function            PT Goals (current goals can now be found in the care plan section) Acute Rehab PT Goals PT Goal Formulation: With patient Time For Goal Achievement: 07/14/20 Potential to Achieve Goals: Good Progress towards PT goals: Progressing toward goals    Frequency    Min 3X/week      PT Plan Current plan remains appropriate    Co-evaluation              AM-PAC PT "6 Clicks" Mobility   Outcome Measure  Help needed turning from  your back to your side while in a flat bed without using bedrails?: A Little Help needed moving from lying on your back to sitting on the side of a flat bed without using bedrails?: A Little Help needed moving to and from a bed to a chair (including a wheelchair)?: A Little Help needed standing up from a chair using your arms (e.g., wheelchair or bedside chair)?: A Little Help needed to walk in hospital room?: A Lot Help needed climbing 3-5 steps with a railing? : A Lot 6 Click Score: 16    End of Session Equipment Utilized During Treatment: Gait belt Activity Tolerance: Patient tolerated treatment well Patient left: in bed;with call bell/phone within reach;with bed alarm set Nurse Communication: Mobility status PT Visit Diagnosis: Unsteadiness on feet (R26.81);Other abnormalities of gait and mobility (R26.89)     Time: 1030-1044 PT Time  Calculation (min) (ACUTE ONLY): 14 min  Charges:  $Gait Training: 8-22 mins                     Baxter Flattery, PT  Acute Rehab Dept ([place]) [phone] Pager [phone]  07/03/2020    [facility] 07/03/2020, 10:57 AM

## 2020-07-03 NOTE — Progress Notes (Signed)
PROGRESS NOTE  Danny Kerr WUJ:[id number] DOB: 09/19/1956   PCP: [name], MD  Patient is from: Home.  DOA: 06/27/2020 LOS: 5  Brief Narrative / Interim history: 64 y.o. male with a history of alcoholism who presented on the morning of 9/6 by EMS due to alcohol withdrawal having had his last drink of vodka the prior evening. He was hemodynamically stable, afebrile, with alcohol level of 133. K 3.3, magnesium 1.7, platelets 106, TBili 1.3. He was given ativan, normal saline, vitamins, and admitted for alcohol withdrawal on CIWA protocol.   Patient is on Librium taper with as needed Ativan.  He is a stable for discharge to SNF once bed available.  Assessment & Plan: Alcoholism/alcohol withdrawal-reportedly drinks a quart of vodka per day.  Attempted multiple rehabilitation programs without significant success.  Presented with tremors, visual hallucination and weakness. CIWA score improving.  Required about 4 mg of Ativan at 3 different times in last 24 hours.  Completed Librium.  Only on as needed Ativan per CIWA protocol.  Continue multivitamins.  Cognitive decline: patient wife RN in ambulatory setting concerned about possible Wernicke/Korasakoff after reading online.  He hasn't had MRI brain.  Obviously he is at risk.  He had 3/3 on Mini-Mental exam both immediate and delayed but could not recall his ex-wife's visitation 24 hours later.  Completed high-dose IV thiamine for 3 days. -Reorientation and delirium precautions -Continue p.o. thiamine.  Elevated liver enzymes/hyperbilirubinemia-likely alcoholic hepatitis.  Resolved except for elevated bilirubin. Total bili 2.2 (1.7 and direct). This seems to be chronic back to 2020. With a stable H&H, there could be some abnormalities with conjugation.   Hypokalemia: Resolved.  Mild hyponatremia: Na 134. Could be due to alcohol.  Resolving. -Continue monitoring  Normocytic anemia: Likely from alcohol.  H&H relatively stable.  Anemia  panel basically normal. -Continue monitoring  Thrombocytopenia: Likely alcohol induced.  Improving.  No signs of bleeding.  Generalized weakness/debility: per wife, they are separated and he lives by himself. She doesn't feel he can take care of himself. She noted blood everywhere when she went to check on him at his apartment before this admission. She thinks he might fell.  -Continue p.o. vitamin B12 1000 mcg daily -PT/OT recommended SNF which is reasonable.  TOC on board for placement.  Anxiety: -Continue home Zoloft. -Also on as needed Ativan  Body mass index is 23.75 kg/m. Nutrition Problem: Increased nutrient needs Etiology:  (ETOH abuse) Signs/Symptoms: estimated needs Interventions: Ensure Enlive (each supplement provides 350kcal and 20 grams of protein), Magic cup   DVT prophylaxis:  enoxaparin (LOVENOX) injection 40 mg Start: 06/27/20 2200  Code Status: Full code Family Communication: Previous hospitalist updated patient's ex-wife over the phone with patient's permission on 9/10. Status is: Inpatient  Remains inpatient appropriate because: Still with mild withdrawal symptoms and unsafe DC plan as he needs SNF.   Dispo: The patient is from: Home              Anticipated d/c is to: SNF              Anticipated d/c date is: 1 day              Patient currently is not medically stable to d/c.       Consultants:  None   Subjective: Seen and examined.  No complaints.  Very fine mild tremors in the upper extremities.  Patient claims that he does have that much tremors on daily basis even on the  good days.  Objective: Vitals:   07/02/20 1800 07/02/20 2305 07/03/20 0441 07/03/20 1259  BP: 109/76 123/80 109/79 126/81  Pulse: 62 69 85 75  Resp:  20 20 20   Temp:  98.2 F (36.8 C) 98.7 F (37.1 C) 97.6 F (36.4 C)  TempSrc:  Oral Oral Oral  SpO2:  99% 100% 100%  Weight:      Height:        Intake/Output Summary (Last 24 hours) at 07/03/2020 1353 Last data  filed at 07/03/2020 1000 Gross per 24 hour  Intake 720 ml  Output 1275 ml  Net -555 ml   Filed Weights   06/27/20 0659  Weight: 86.2 kg    Examination:  General exam: Appears calm and comfortable  Respiratory system: Clear to auscultation. Respiratory effort normal. Cardiovascular system: S1 & S2 heard, RRR. No JVD, murmurs, rubs, gallops or clicks. No pedal edema. Gastrointestinal system: Abdomen is nondistended, soft and nontender. No organomegaly or masses felt. Normal bowel sounds heard. Central nervous system: Alert and oriented. No focal neurological deficits. Extremities: Symmetric 5 x 5 power. Skin: No rashes, lesions or ulcers.   Procedures:  None  Microbiology summarized: COVID-19 PCR negative.    Sch Meds:  Scheduled Meds: . enoxaparin (LOVENOX) injection  40 mg Subcutaneous Q24H  . feeding supplement (ENSURE ENLIVE)  237 mL Oral BID BM  . folic acid  1 mg Oral Daily  . multivitamin with minerals  1 tablet Oral Daily  . potassium chloride  40 mEq Oral BID  . sertraline  50 mg Oral Daily  . thiamine  100 mg Oral Daily  . vitamin B-12  1,000 mcg Oral Daily   Continuous Infusions:  PRN Meds:.loperamide, LORazepam **OR** LORazepam, metoprolol tartrate, ondansetron **OR** ondansetron (ZOFRAN) IV, polyvinyl alcohol  Antimicrobials: Anti-infectives (From admission, onward)   None       I have personally reviewed the following labs and images: CBC: Recent Labs  Lab 06/27/20 0725 06/28/20 0309 06/29/20 0508 07/01/20 0456  WBC 4.5 5.4 5.1 4.3  HGB 12.7* 12.5* 13.1 12.9*  HCT 36.3* 36.5* 38.0* 38.7*  MCV 97.8 99.5 99.2 101.6*  PLT 106* 84* 107* 139*   BMP &GFR Recent Labs  Lab 06/27/20 0725 06/27/20 0725 06/28/20 0309 06/29/20 0508 06/30/20 0513 07/01/20 0456 07/03/20 0809  NA 138   < > 136 139 134* 135 138  K 3.3*   < > 3.2* 4.0 4.0 4.5 4.2  CL 101   < > 101 103 102 99 98  CO2 23   < > 24 22 24 27 28   GLUCOSE 95   < > 90 105* 101* 100*  101*  BUN 8   < > 9 10 11 8 11   CREATININE 0.50*   < > 0.52* 0.54* 0.56* 0.52* 0.64  CALCIUM 8.7*   < > 8.8* 9.3 9.1 9.3 9.4  MG 1.7  --   --  2.0 1.8 1.9 2.1  PHOS 2.8  --   --  3.1 3.3 4.1  --    < > = values in this interval not displayed.   Estimated Creatinine Clearance: 110 mL/min (by C-G formula based on SCr of 0.64 mg/dL). Liver & Pancreas: Recent Labs  Lab 06/27/20 0725 06/28/20 0309 06/29/20 0508 06/30/20 0513 07/01/20 0456  AST 53* 39 30  --  31  ALT 16 14 14   --  13  ALKPHOS 118 103 111  --  105  BILITOT 1.3* 2.2* 2.2*  2.2*  --  1.5*  PROT 7.2 6.8 7.1  --  6.9  ALBUMIN 4.4 3.9 3.9 3.6 3.8   No results for input(s): LIPASE, AMYLASE in the last 168 hours. Recent Labs  Lab 06/30/20 0513  AMMONIA 15   Diabetic: No results for input(s): HGBA1C in the last 72 hours. No results for input(s): GLUCAP in the last 168 hours. Cardiac Enzymes: No results for input(s): CKTOTAL, CKMB, CKMBINDEX, TROPONINI in the last 168 hours. No results for input(s): PROBNP in the last 8760 hours. Coagulation Profile: No results for input(s): INR, PROTIME in the last 168 hours. Thyroid Function Tests: No results for input(s): TSH, T4TOTAL, FREET4, T3FREE, THYROIDAB in the last 72 hours. Lipid Profile: No results for input(s): CHOL, HDL, LDLCALC, TRIG, CHOLHDL, LDLDIRECT in the last 72 hours. Anemia Panel: No results for input(s): VITAMINB12, FOLATE, FERRITIN, TIBC, IRON, RETICCTPCT in the last 72 hours. Urine analysis:    Component Value Date/Time   COLORURINE AMBER (A) 08/20/2019 1608   APPEARANCEUR CLEAR 08/20/2019 1608   LABSPEC 1.024 08/20/2019 1608   PHURINE 5.0 08/20/2019 1608   GLUCOSEU 50 (A) 08/20/2019 1608   HGBUR NEGATIVE 08/20/2019 1608   BILIRUBINUR NEGATIVE 08/20/2019 1608   KETONESUR 80 (A) 08/20/2019 1608   PROTEINUR NEGATIVE 08/20/2019 1608   UROBILINOGEN 0.2 08/26/2014 1425   NITRITE NEGATIVE 08/20/2019 1608   LEUKOCYTESUR NEGATIVE 08/20/2019 1608    Sepsis Labs: Invalid input(s): PROCALCITONIN, [place]  Microbiology: Recent Results (from the past 240 hour(s))  SARS Coronavirus 2 by RT PCR (hospital order, performed in [facility] hospital lab) Nasopharyngeal Nasopharyngeal Swab     Status: None   Collection Time: 06/27/20 10:45 AM   Specimen: Nasopharyngeal Swab  Result Value Ref Range Status   SARS Coronavirus 2 NEGATIVE NEGATIVE Final    Comment: (NOTE) SARS-CoV-2 target nucleic acids are NOT DETECTED.  The SARS-CoV-2 RNA is generally detectable in upper and lower respiratory specimens during the acute phase of infection. The lowest concentration of SARS-CoV-2 viral copies this assay can detect is 250 copies / mL. A negative result does not preclude SARS-CoV-2 infection and should not be used as the sole basis for treatment or other patient management decisions.  A negative result may occur with improper specimen collection / handling, submission of specimen other than nasopharyngeal swab, presence of viral mutation(s) within the areas targeted by this assay, and inadequate number of viral copies (<250 copies / mL). A negative result must be combined with clinical observations, patient history, and epidemiological information.  Fact Sheet for Patients:   StrictlyIdeas.no  Fact Sheet for Healthcare Providers: BankingDealers.co.za  This test is not yet approved or  cleared by the [place] FDA and has been authorized for detection and/or diagnosis of SARS-CoV-2 by FDA under an Emergency Use Authorization (EUA).  This EUA will remain in effect (meaning this test can be used) for the duration of the COVID-19 declaration under Section 564(b)(1) of the Act, 21 U.S.C. section 360bbb-3(b)(1), unless the authorization is terminated or revoked sooner.  Performed at [facility], [place] [address] [place], [place] [address]     Radiology Studies: No  results found.  Ishmael Holter, MD [facility]  If 7PM-7AM, please contact night-coverage [email or web address] 07/03/2020, 1:53 PM

## 2020-07-03 NOTE — TOC Progression Note (Signed)
Transition of Care [facility]) - Progression Note    Patient Details  Name: Danny Kerr MRN: [id number] Date of Birth: August 12, 1956  Transition of Care [facility]) CM/SW [place],  Phone Number: 07/03/2020, 3:31 PM  Clinical Narrative:   CSW completed outreach attempt to patient's spouse to discuss SNF placement but was unable to reach her successfully. CSW was unable to leave a voice message as mailbox was full. Social work team will continue to follow/monitor case for SNF placement.   Expected Discharge Plan: [place] Barriers to Discharge: No Barriers Identified  Expected Discharge Plan and Services Expected Discharge Plan: [place] arrangements for the past 2 months: Single Family Home                    Social Determinants of Health (SDOH) Interventions    Readmission Risk Interventions No flowsheet data found.   Eula Fried, BSW, MSW, LCSW [email or web address]

## 2020-07-04 NOTE — NC FL2 (Signed)
[place] LEVEL OF CARE SCREENING TOOL     IDENTIFICATION  Patient Name: Danny Kerr Birthdate: 02/03/1956 Sex: male Admission Date (Current Location): 06/27/2020  [facility] and [place] Number:  Herbalist and Address:  [facility],  [place] [address], [place]      Provider Number: [id number]  Attending Physician Name and Address:  Darliss Cheney, MD  Relative Name and Phone Number:  Daryll, Spisak   [phone]    Current Level of Care: Hospital Recommended Level of Care: [place] Prior Approval Number:    Date Approved/Denied:   PASRR Number: [id number]  Discharge Plan: SNF    Current Diagnoses: Patient Active Problem List   Diagnosis Date Noted  . Alcohol withdrawal ([place]) 06/27/2020  . Hypokalemia 06/27/2020  . Anxiety 03/23/2020  . Thrombocytopenia ([place])   . Alcohol abuse 10/03/2019  . 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    Orientation RESPIRATION BLADDER Height & Weight     Self, Time, Situation, Place  Normal Continent Weight: 190 lb (86.2 kg) Height:  6\' 3"  (190.5 cm)  BEHAVIORAL SYMPTOMS/MOOD NEUROLOGICAL BOWEL NUTRITION STATUS      Incontinent Diet (Regular diet)  AMBULATORY STATUS COMMUNICATION OF NEEDS Skin   Limited Assist Verbally Skin abrasions                       Personal Care Assistance Level of Assistance  Bathing, Feeding, Dressing Bathing Assistance: Limited assistance Feeding assistance: Independent Dressing Assistance: Limited assistance     Functional Limitations Info  Sight, Hearing, Speech Sight Info: Adequate Hearing Info: Adequate Speech Info: Adequate    SPECIAL CARE FACTORS FREQUENCY  PT (By licensed PT), OT (By licensed OT)     PT Frequency: Minimum 5x a week OT Frequency: Minimum 5x a week            Contractures Contractures Info: Not present    Additional Factors Info  Code Status, Allergies, Psychotropic Code Status Info: Full  Code Allergies Info: NKA Psychotropic Info: sertraline (ZOLOFT) tablet 50 mg         Current Medications (07/04/2020):  This is the current hospital active medication list Current Facility-Administered Medications  Medication Dose Route Frequency Provider Last Rate Last Admin  . feeding supplement (ENSURE ENLIVE) (ENSURE ENLIVE) liquid 237 mL  237 mL Oral BID BM Gonfa, Taye T, MD   237 mL at 07/04/20 1326  . folic acid (FOLVITE) tablet 1 mg  1 mg Oral Daily Patrecia Pour, MD   1 mg at 07/04/20 0934  . loperamide (IMODIUM) capsule 2-4 mg  2-4 mg Oral PRN Gonfa, Taye T, MD      . LORazepam (ATIVAN) tablet 1-4 mg  1-4 mg Oral Q1H PRN Wendee Beavers T, MD   1 mg at 07/03/20 1051   Or  . LORazepam (ATIVAN) injection 1-4 mg  1-4 mg Intravenous Q1H PRN Wendee Beavers T, MD   2 mg at 07/02/20 0617  . metoprolol tartrate (LOPRESSOR) injection 5 mg  5 mg Intravenous Q6H PRN Harold Hedge, MD      . multivitamin with minerals tablet 1 tablet  1 tablet Oral Daily Patrecia Pour, MD   1 tablet at 07/04/20 0934  . ondansetron (ZOFRAN) tablet 4 mg  4 mg Oral Q6H PRN Harold Hedge, MD       Or  . ondansetron [facility]) injection 4 mg  4 mg Intravenous Q6H PRN Harold Hedge,  MD      . polyvinyl alcohol (LIQUIFILM TEARS) 1.4 % ophthalmic solution 1 drop  1 drop Both Eyes PRN Gonfa, Taye T, MD      . potassium chloride SA (KLOR-CON) CR tablet 40 mEq  40 mEq Oral BID Patrecia Pour, MD   40 mEq at 07/04/20 0934  . sertraline (ZOLOFT) tablet 50 mg  50 mg Oral Daily Harold Hedge, MD   50 mg at 07/04/20 0934  . thiamine tablet 100 mg  100 mg Oral Daily Wendee Beavers T, MD   100 mg at 07/04/20 0933  . vitamin B-12 (CYANOCOBALAMIN) tablet 1,000 mcg  1,000 mcg Oral Daily Mercy Riding, MD   1,000 mcg at 07/04/20 4166     Discharge Medications: Please see discharge summary for a list of discharge medications.  Relevant Imaging Results:  Relevant Lab Results:   Additional Information SSN [id number]  Ross Ludwig, LCSW

## 2020-07-04 NOTE — Progress Notes (Signed)
Physical Therapy Treatment Patient Details Name: Danny Kerr MRN: [id number] DOB: 1956/07/20 Today's Date: 07/04/2020    History of Present Illness 64 y.o. male with a history of alcoholism and admitted for alcohol withdrawal.    PT Comments    General transfer comment: VERY unsteady, ataxic and shaky throughout.  Requires constant "hands on" assist due to poor self corrective balance responce.  Assisted in bathroom present with constanttremors/"the shakes"   General Gait Details: attempt gait without any AD (age appropriate) pt was too unsteady.  Ataxic, tremors and poor self correction to midline.  Pt with multiple excuses.  "I haven't been up all day".  "they don't let me move".  Pt able to amb a great distance however Mod lean on walker and with tendency to push out too far front.  Unsteady, choppy gait.  HIGH FALL RISK.    Follow Up Recommendations  SNF;Supervision/Assistance - 24 hour     Equipment Recommendations  Rolling walker with 5" wheels    Recommendations for Other Services       Precautions / Restrictions Restrictions Weight Bearing Restrictions: No    Mobility  Bed Mobility Overal bed mobility: Needs Assistance Bed Mobility: Supine to Sit;Sit to Supine     Supine to sit: Supervision Sit to supine: Supervision   General bed mobility comments: increased time self able  Transfers Overall transfer level: Needs assistance Equipment used: Rolling walker (2 wheeled) Transfers: Sit to/from [facility] Sit to Stand: Min assist Stand pivot transfers: Min assist;Mod assist       General transfer comment: VERY unsteady, ataxic and shaky throughout.  Requires constant "hands on" assist due to poor self corrective balance responce.  Assisted in bathroom present with constanttremors/"the shakes"  Ambulation/Gait Ambulation/Gait assistance: Min assist Gait Distance (Feet): 225 Feet Assistive device: Rolling walker (2 wheeled) Gait  Pattern/deviations: Step-through pattern;Decreased stride length;Wide base of support;Trunk flexed;Drifts right/left Gait velocity: decreased   General Gait Details: attempt gait without any AD (age appropriate) pt was too unsteady.  Ataxic, tremors and poor self correction to midline.  Pt with multiple excuses.  "I haven't been up all day".  "they don't let me move".  Pt able to amb a great distance however Mod lean on walker and with tendency to push out too far front.  Unsteady, choppy gait.  HIGH FALL RISK.   Stairs             Wheelchair Mobility    Modified Rankin (Stroke Patients Only)       Balance                                            Cognition Arousal/Alertness: Awake/alert Behavior During Therapy: WFL for tasks assessed/performed                                   General Comments: AxO x 3      Exercises      General Comments        Pertinent Vitals/Pain Pain Assessment: No/denies pain    Home Living                      Prior Function            PT Goals (current goals can now be found  in the care plan section) Progress towards PT goals: Progressing toward goals    Frequency    Min 3X/week      PT Plan Current plan remains appropriate    Co-evaluation              AM-PAC PT "6 Clicks" Mobility   Outcome Measure  Help needed turning from your back to your side while in a flat bed without using bedrails?: A Little Help needed moving from lying on your back to sitting on the side of a flat bed without using bedrails?: A Little Help needed moving to and from a bed to a chair (including a wheelchair)?: A Little Help needed standing up from a chair using your arms (e.g., wheelchair or bedside chair)?: A Little Help needed to walk in hospital room?: A Lot Help needed climbing 3-5 steps with a railing? : A Lot 6 Click Score: 16    End of Session Equipment Utilized During Treatment: Gait  belt Activity Tolerance: Patient tolerated treatment well Patient left: in bed;with call bell/phone within reach;with bed alarm set Nurse Communication: Mobility status PT Visit Diagnosis: Unsteadiness on feet (R26.81);Other abnormalities of gait and mobility (R26.89)     Time: 2992-4268 PT Time Calculation (min) (ACUTE ONLY): 25 min  Charges:  $Gait Training: 23-37 mins                     Rica Koyanagi  PTA Acute  Rehabilitation Services Pager      [phone] Office      [phone]

## 2020-07-04 NOTE — Progress Notes (Signed)
PROGRESS NOTE  Danny Kerr SNK:[id number] DOB: [birth year]   PCP: [name], MD  Patient is from: Home.  DOA: 06/27/2020 LOS: 6  Brief Narrative / Interim history: 64 y.o. male with a history of alcoholism who presented on the morning of 9/6 by EMS due to alcohol withdrawal having had his last drink of vodka the prior evening. He was hemodynamically stable, afebrile, with alcohol level of 133. K 3.3, magnesium 1.7, platelets 106, TBili 1.3. He was given ativan, normal saline, vitamins, and admitted for alcohol withdrawal on CIWA protocol.    Patient is on Librium taper with as needed Ativan.  He is a stable for discharge to SNF once bed available.  Assessment & Plan: Alcoholism/alcohol withdrawal-reportedly drinks a quart of vodka per day.  Attempted multiple rehabilitation programs without significant success.  Presented with tremors, visual hallucination and weakness.   Completed Librium.  Only on as needed Ativan per CIWA protocol.  His CIWA has remained under 10 for most part in last 24 hours and his last as needed Ativan dose was 24 hours ago.  Continue multivitamins.  Cognitive decline: patient wife RN in ambulatory setting concerned about possible Wernicke/Korasakoff after reading online.  He hasn't had MRI brain.  Obviously he is at risk.  He had 3/3 on Mini-Mental exam both immediate and delayed but could not recall his ex-wife's visitation 24 hours later.  Completed high-dose IV thiamine for 3 days. -Reorientation and delirium precautions -Continue p.o. thiamine.  Elevated liver enzymes/hyperbilirubinemia-likely alcoholic hepatitis.  Resolved except for elevated bilirubin. Total bili 2.2 (1.7 and direct). This seems to be chronic back to 2020. With a stable H&H, there could be some abnormalities with conjugation.   Hypokalemia: Resolved.  Mild hyponatremia: Na 134. Could be due to alcohol.  Resolving. -Continue monitoring  Normocytic anemia: Likely from alcohol.  H&H  relatively stable.  Anemia panel basically normal. -Continue monitoring  Thrombocytopenia: Likely alcohol induced.  Improving.  No signs of bleeding.  Generalized weakness/debility: per wife, they are separated and he lives by himself. She doesn't feel he can take care of himself. She noted blood everywhere when she went to check on him at his apartment before this admission. She thinks he might fell.  -Continue p.o. vitamin B12 1000 mcg daily -PT/OT recommended SNF. TOC on board for placement.  Anxiety: -Continue home Zoloft. -Also on as needed Ativan  Body mass index is 23.75 kg/m. Nutrition Problem: Increased nutrient needs Etiology:  (ETOH abuse) Signs/Symptoms: estimated needs Interventions: Ensure Enlive (each supplement provides 350kcal and 20 grams of protein), Magic cup   DVT prophylaxis:  enoxaparin (LOVENOX) injection 40 mg Start: 06/27/20 2200  Code Status: Full code Family Communication: Previous hospitalist updated patient's ex-wife over the phone with patient's permission on 9/10. Status is: Inpatient  Remains inpatient appropriate because: Unsafe DC plan   Dispo: The patient is from: Home              Anticipated d/c is to: SNF              Anticipated d/c date is: Unknown.  Awaiting bed arrangement by TOC.              Patient currently is medically stable to d/c.       Consultants:  None   Subjective: Patient seen and examined.  He has no complaints.  Objective: Vitals:   07/03/20 1259 07/03/20 2333 07/04/20 0505 07/04/20 1253  BP: 126/81 134/85 110/73 101/66  Pulse: 75 70  69 76  Resp: 20 18 18 17   Temp: 97.6 F (36.4 C) 98.4 F (36.9 C) 98.4 F (36.9 C) 98 F (36.7 C)  TempSrc: Oral Oral Oral Oral  SpO2: 100% 100% 98% 99%  Weight:      Height:        Intake/Output Summary (Last 24 hours) at 07/04/2020 1410 Last data filed at 07/04/2020 0900 Gross per 24 hour  Intake 480 ml  Output 1400 ml  Net -920 ml   Filed Weights   06/27/20  0659  Weight: 86.2 kg    Examination:  General exam: Appears calm and comfortable  Respiratory system: Clear to auscultation. Respiratory effort normal. Cardiovascular system: S1 & S2 heard, RRR. No JVD, murmurs, rubs, gallops or clicks. No pedal edema. Gastrointestinal system: Abdomen is nondistended, soft and nontender. No organomegaly or masses felt. Normal bowel sounds heard. Central nervous system: Alert and oriented. No focal neurological deficits. Extremities: Symmetric 5 x 5 power. Skin: No rashes, lesions or ulcers.   Procedures:  None  Microbiology summarized: COVID-19 PCR negative.    Sch Meds:  Scheduled Meds: . feeding supplement (ENSURE ENLIVE)  237 mL Oral BID BM  . folic acid  1 mg Oral Daily  . multivitamin with minerals  1 tablet Oral Daily  . potassium chloride  40 mEq Oral BID  . sertraline  50 mg Oral Daily  . thiamine  100 mg Oral Daily  . vitamin B-12  1,000 mcg Oral Daily   Continuous Infusions:  PRN Meds:.loperamide, LORazepam **OR** LORazepam, metoprolol tartrate, ondansetron **OR** ondansetron (ZOFRAN) IV, polyvinyl alcohol  Antimicrobials: Anti-infectives (From admission, onward)   None       I have personally reviewed the following labs and images: CBC: Recent Labs  Lab 06/28/20 0309 06/29/20 0508 07/01/20 0456  WBC 5.4 5.1 4.3  HGB 12.5* 13.1 12.9*  HCT 36.5* 38.0* 38.7*  MCV 99.5 99.2 101.6*  PLT 84* 107* 139*   BMP &GFR Recent Labs  Lab 06/28/20 0309 06/29/20 0508 06/30/20 0513 07/01/20 0456 07/03/20 0809  NA 136 139 134* 135 138  K 3.2* 4.0 4.0 4.5 4.2  CL 101 103 102 99 98  CO2 24 22 24 27 28   GLUCOSE 90 105* 101* 100* 101*  BUN 9 10 11 8 11   CREATININE 0.52* 0.54* 0.56* 0.52* 0.64  CALCIUM 8.8* 9.3 9.1 9.3 9.4  MG  --  2.0 1.8 1.9 2.1  PHOS  --  3.1 3.3 4.1  --    Estimated Creatinine Clearance: 110 mL/min (by C-G formula based on SCr of 0.64 mg/dL). Liver & Pancreas: Recent Labs  Lab 06/28/20 0309  06/29/20 0508 06/30/20 0513 07/01/20 0456  AST 39 30  --  31  ALT 14 14  --  13  ALKPHOS 103 111  --  105  BILITOT 2.2* 2.2*  2.2*  --  1.5*  PROT 6.8 7.1  --  6.9  ALBUMIN 3.9 3.9 3.6 3.8   No results for input(s): LIPASE, AMYLASE in the last 168 hours. Recent Labs  Lab 06/30/20 0513  AMMONIA 15   Diabetic: No results for input(s): HGBA1C in the last 72 hours. No results for input(s): GLUCAP in the last 168 hours. Cardiac Enzymes: No results for input(s): CKTOTAL, CKMB, CKMBINDEX, TROPONINI in the last 168 hours. No results for input(s): PROBNP in the last 8760 hours. Coagulation Profile: No results for input(s): INR, PROTIME in the last 168 hours. Thyroid Function Tests: No results for input(s): TSH, T4TOTAL,  FREET4, T3FREE, THYROIDAB in the last 72 hours. Lipid Profile: No results for input(s): CHOL, HDL, LDLCALC, TRIG, CHOLHDL, LDLDIRECT in the last 72 hours. Anemia Panel: No results for input(s): VITAMINB12, FOLATE, FERRITIN, TIBC, IRON, RETICCTPCT in the last 72 hours. Urine analysis:    Component Value Date/Time   COLORURINE AMBER (A) 08/20/2019 1608   APPEARANCEUR CLEAR 08/20/2019 1608   LABSPEC 1.024 08/20/2019 1608   PHURINE 5.0 08/20/2019 1608   GLUCOSEU 50 (A) 08/20/2019 1608   HGBUR NEGATIVE 08/20/2019 1608   BILIRUBINUR NEGATIVE 08/20/2019 1608   KETONESUR 80 (A) 08/20/2019 1608   PROTEINUR NEGATIVE 08/20/2019 1608   UROBILINOGEN 0.2 08/26/2014 1425   NITRITE NEGATIVE 08/20/2019 1608   LEUKOCYTESUR NEGATIVE 08/20/2019 1608   Sepsis Labs: Invalid input(s): PROCALCITONIN, [place]  Microbiology: Recent Results (from the past 240 hour(s))  SARS Coronavirus 2 by RT PCR (hospital order, performed in [facility] hospital lab) Nasopharyngeal Nasopharyngeal Swab     Status: None   Collection Time: 06/27/20 10:45 AM   Specimen: Nasopharyngeal Swab  Result Value Ref Range Status   SARS Coronavirus 2 NEGATIVE NEGATIVE Final    Comment:  (NOTE) SARS-CoV-2 target nucleic acids are NOT DETECTED.  The SARS-CoV-2 RNA is generally detectable in upper and lower respiratory specimens during the acute phase of infection. The lowest concentration of SARS-CoV-2 viral copies this assay can detect is 250 copies / mL. A negative result does not preclude SARS-CoV-2 infection and should not be used as the sole basis for treatment or other patient management decisions.  A negative result may occur with improper specimen collection / handling, submission of specimen other than nasopharyngeal swab, presence of viral mutation(s) within the areas targeted by this assay, and inadequate number of viral copies (<250 copies / mL). A negative result must be combined with clinical observations, patient history, and epidemiological information.  Fact Sheet for Patients:   StrictlyIdeas.no  Fact Sheet for Healthcare Providers: BankingDealers.co.za  This test is not yet approved or  cleared by the [place] FDA and has been authorized for detection and/or diagnosis of SARS-CoV-2 by FDA under an Emergency Use Authorization (EUA).  This EUA will remain in effect (meaning this test can be used) for the duration of the COVID-19 declaration under Section 564(b)(1) of the Act, 21 U.S.C. section 360bbb-3(b)(1), unless the authorization is terminated or revoked sooner.  Performed at [facility], [place] [address], [place], [place] [address]     Radiology Studies: No results found.  Ishmael Holter, MD [facility]  If 7PM-7AM, please contact night-coverage [email or web address] 07/04/2020, 2:10 PM

## 2020-07-04 NOTE — Care Management Important Message (Signed)
Important Message  Patient Details IM Letter given to the Patient Name: Danny Kerr MRN: [id number] Date of Birth: 31-Dec-1955   Medicare Important Message Given:  Yes     Kerin Salen 07/04/2020, 11:18 AM

## 2020-07-04 NOTE — TOC Progression Note (Signed)
Transition of Care [facility]) - Progression Note    Patient Details  Name: Danny Kerr MRN: [id number] Date of Birth: [birth year]  Transition of [facility]) CM/SW Contact  Ross Ludwig, [place] Phone Number: 07/04/2020, 6:12 PM  Clinical Narrative:    CSW spoke to patient's wife, and explained process for finding SNF placement. CSW explained how insurance will pay for stay and what to expect at SNF.  Patient's wife gave CSW permission to begin bed search in [facility].   Expected Discharge Plan: [place] Barriers to Discharge: No Barriers Identified  Expected Discharge Plan and Services Expected Discharge Plan: [place] arrangements for the past 2 months: Single Family Home                                       Social Determinants of Health (SDOH) Interventions    Readmission Risk Interventions No flowsheet data found.

## 2020-07-05 LAB — SARS CORONAVIRUS 2 BY RT PCR (HOSPITAL ORDER, PERFORMED IN ~~LOC~~ HOSPITAL LAB): SARS Coronavirus 2: NEGATIVE

## 2020-07-05 NOTE — Progress Notes (Signed)
Physical Therapy Treatment Patient Details Name: Danny Kerr MRN: [id number] DOB: 08-16-1956 Today's Date: 07/05/2020    History of Present Illness 64 y.o. male with a history of alcoholism and admitted for alcohol withdrawal.    PT Comments    General transfer comment: VERY unsteady, ataxic and shaky throughout.  Requires constant "hands on" assist due to poor self corrective balance responce.General Gait Details: slight improvement in balance and coordination but still requires use of a walker for stability.  Tolerated an increased distance as well.  50% VC's on proper walker to self distance and safety with turns.  Follow Up Recommendations  SNF     Equipment Recommendations  Rolling walker with 5" wheels    Recommendations for Other Services       Precautions / Restrictions Precautions Precautions: Fall    Mobility  Bed Mobility Overal bed mobility: Needs Assistance Bed Mobility: Supine to Sit;Sit to Supine     Supine to sit: Supervision Sit to supine: Supervision   General bed mobility comments: increased time but self able  Transfers Overall transfer level: Needs assistance Equipment used: Rolling walker (2 wheeled) Transfers: Sit to/from [facility] Sit to Stand: Min guard Stand pivot transfers: Min guard;Min assist       General transfer comment: VERY unsteady, ataxic and shaky throughout.  Requires constant "hands on" assist due to poor self corrective balance responce.  Ambulation/Gait Ambulation/Gait assistance: Min assist;Min guard Gait Distance (Feet): 500 Feet Assistive device: Rolling walker (2 wheeled) Gait Pattern/deviations: Step-through pattern;Decreased stride length;Wide base of support;Trunk flexed;Drifts right/left Gait velocity: decreased   General Gait Details: slight improvement in balance and coordination but still requires use of a walker for stability.  Tolerated an increased distance as well.  50% VC's on proper  walker to self distance and safety with turns.   Stairs             Wheelchair Mobility    Modified Rankin (Stroke Patients Only)       Balance                                            Cognition Arousal/Alertness: Awake/alert Behavior During Therapy: WFL for tasks assessed/performed Overall Cognitive Status: Within Functional Limits for tasks assessed                                 General Comments: AxO x 3      Exercises      General Comments        Pertinent Vitals/Pain Pain Assessment: No/denies pain    Home Living                      Prior Function            PT Goals (current goals can now be found in the care plan section) Progress towards PT goals: Progressing toward goals    Frequency    Min 3X/week      PT Plan Current plan remains appropriate    Co-evaluation              AM-PAC PT "6 Clicks" Mobility   Outcome Measure  Help needed turning from your back to your side while in a flat bed without using bedrails?: A Little Help needed moving from lying  on your back to sitting on the side of a flat bed without using bedrails?: A Little Help needed moving to and from a bed to a chair (including a wheelchair)?: A Little Help needed standing up from a chair using your arms (e.g., wheelchair or bedside chair)?: A Little Help needed to walk in hospital room?: A Lot Help needed climbing 3-5 steps with a railing? : A Lot 6 Click Score: 16    End of Session Equipment Utilized During Treatment: Gait belt Activity Tolerance: Patient tolerated treatment well Patient left: in bed;with call bell/phone within reach;with bed alarm set Nurse Communication: Mobility status PT Visit Diagnosis: Unsteadiness on feet (R26.81);Other abnormalities of gait and mobility (R26.89)     Time: 9432-0037 PT Time Calculation (min) (ACUTE ONLY): 20 min  Charges:  $Gait Training: 8-22 mins                      Rica Koyanagi  PTA Acute  Rehabilitation Services Pager      [phone] Office      [phone]

## 2020-07-05 NOTE — Progress Notes (Signed)
PROGRESS NOTE  Danny Kerr YQM:[id number] DOB: 1956/10/14   PCP: [name], MD  Patient is from: Home.  DOA: 06/27/2020 LOS: 7  Brief Narrative / Interim history: 64 y.o. male with a history of alcoholism who presented on the morning of 9/6 by EMS due to alcohol withdrawal having had his last drink of vodka the prior evening. He was hemodynamically stable, afebrile, with alcohol level of 133. K 3.3, magnesium 1.7, platelets 106, TBili 1.3. He was given ativan, normal saline, vitamins, and admitted for alcohol withdrawal on CIWA protocol.    He completed his Librium taper and currently on as needed Ativan per CIWA protocol but has not required any Ativan last 24 hours.  He is a stable for discharge to SNF once bed available.  Assessment & Plan: Alcoholism/alcohol withdrawal-reportedly drinks a quart of vodka per day.  Attempted multiple rehabilitation programs without significant success.  Presented with tremors, visual hallucination and weakness.   Completed Librium.  Only on as needed Ativan per CIWA protocol.  His CIWA has remained under 10 for most part in last 24 hours and he has not required any as needed Ativan.  Continue multivitamin.  He has no tremors today.  Cognitive decline: patient wife RN in ambulatory setting concerned about possible Wernicke/Korasakoff after reading online.  He hasn't had MRI brain.  Obviously he is at risk.  He had 3/3 on Mini-Mental exam both immediate and delayed but could not recall his ex-wife's visitation 24 hours later.  Completed high-dose IV thiamine for 3 days.  Today, he is alert, fully oriented and making sense in all his communications/conversations. -Continue p.o. thiamine.  Elevated liver enzymes/hyperbilirubinemia-likely alcoholic hepatitis.  Resolved except for elevated bilirubin. Total bili 2.2 (1.7 and direct). This seems to be chronic back to 2020. With a stable H&H, there could be some abnormalities with conjugation.   Hypokalemia:  Resolved.  Mild hyponatremia: Na 134. Could be due to alcohol.  Resolving. -Continue monitoring  Normocytic anemia: Likely from alcohol.  H&H relatively stable.  Anemia panel basically normal. -Continue monitoring  Thrombocytopenia: Likely alcohol induced.  Improving.  No signs of bleeding.  Generalized weakness/debility: per wife, they are separated and he lives by himself. She doesn't feel he can take care of himself. She noted blood everywhere when she went to check on him at his apartment before this admission. She thinks he might fell.  -Continue p.o. vitamin B12 1000 mcg daily -PT/OT recommended SNF. TOC on board for placement. Patient disappointed that he has been on the bed for most part as he needs help walking back he has walked only twice in last 4 days.  I have placed orders to ambulate patient in the halls every shift.  Patient is aware of that order and he is advised to remind to the nursing staff about this order.  Anxiety: -Continue home Zoloft. -Also on as needed Ativan  Body mass index is 23.75 kg/m. Nutrition Problem: Increased nutrient needs Etiology:  (ETOH abuse) Signs/Symptoms: estimated needs Interventions: Ensure Enlive (each supplement provides 350kcal and 20 grams of protein), Magic cup   DVT prophylaxis:  enoxaparin (LOVENOX) injection 40 mg Start: 06/27/20 2200  Code Status: Full code Family Communication: No family present at bedside.  His ex-wife is involved in communication with TOC.  Patient alert and oriented today. Status is: Inpatient  Remains inpatient appropriate because: Unsafe DC plan   Dispo: The patient is from: Home  Anticipated d/c is to: SNF              Anticipated d/c date is: Unknown.  Awaiting bed arrangement by TOC.              Patient currently is medically stable to d/c.       Consultants:  None   Subjective: Seen and examined.  Complains of tiredness and no other complaints.  He attributes his  tiredness to not being able to walk by himself and receiving no help with walking from the staff.  Denies any other complaint.  Objective: Vitals:   07/04/20 1253 07/04/20 1828 07/04/20 1950 07/05/20 0510  BP: 101/66 97/68 97/67  128/86  Pulse: 76 61 69 71  Resp: 17 19 18 19   Temp: 98 F (36.7 C) 98.4 F (36.9 C) 98.1 F (36.7 C) 97.7 F (36.5 C)  TempSrc: Oral Axillary Oral Oral  SpO2: 99% 98% 98% 99%  Weight:      Height:        Intake/Output Summary (Last 24 hours) at 07/05/2020 1057 Last data filed at 07/05/2020 1030 Gross per 24 hour  Intake 880 ml  Output 5 ml  Net 875 ml   Filed Weights   06/27/20 0659  Weight: 86.2 kg    Examination:  General exam: Appears calm and comfortable  Respiratory system: Clear to auscultation. Respiratory effort normal. Cardiovascular system: S1 & S2 heard, RRR. No JVD, murmurs, rubs, gallops or clicks. No pedal edema. Gastrointestinal system: Abdomen is nondistended, soft and nontender. No organomegaly or masses felt. Normal bowel sounds heard. Central nervous system: Alert and oriented. No focal neurological deficits. Extremities: Symmetric 5 x 5 power. Skin: No rashes, lesions or ulcers.  Psychiatry: Judgement and insight appear normal. Mood & affect appropriate.   Procedures:  None  Microbiology summarized: COVID-19 PCR negative.    Sch Meds:  Scheduled Meds: . feeding supplement (ENSURE ENLIVE)  237 mL Oral BID BM  . folic acid  1 mg Oral Daily  . multivitamin with minerals  1 tablet Oral Daily  . potassium chloride  40 mEq Oral BID  . sertraline  50 mg Oral Daily  . thiamine  100 mg Oral Daily  . vitamin B-12  1,000 mcg Oral Daily   Continuous Infusions:  PRN Meds:.metoprolol tartrate, ondansetron **OR** ondansetron (ZOFRAN) IV, polyvinyl alcohol  Antimicrobials: Anti-infectives (From admission, onward)   None       I have personally reviewed the following labs and images: CBC: Recent Labs  Lab  06/29/20 0508 07/01/20 0456  WBC 5.1 4.3  HGB 13.1 12.9*  HCT 38.0* 38.7*  MCV 99.2 101.6*  PLT 107* 139*   BMP &GFR Recent Labs  Lab 06/29/20 0508 06/30/20 0513 07/01/20 0456 07/03/20 0809  NA 139 134* 135 138  K 4.0 4.0 4.5 4.2  CL 103 102 99 98  CO2 22 24 27 28   GLUCOSE 105* 101* 100* 101*  BUN 10 11 8 11   CREATININE 0.54* 0.56* 0.52* 0.64  CALCIUM 9.3 9.1 9.3 9.4  MG 2.0 1.8 1.9 2.1  PHOS 3.1 3.3 4.1  --    Estimated Creatinine Clearance: 110 mL/min (by C-G formula based on SCr of 0.64 mg/dL). Liver & Pancreas: Recent Labs  Lab 06/29/20 0508 06/30/20 0513 07/01/20 0456  AST 30  --  31  ALT 14  --  13  ALKPHOS 111  --  105  BILITOT 2.2*  2.2*  --  1.5*  PROT 7.1  --  6.9  ALBUMIN 3.9 3.6 3.8   No results for input(s): LIPASE, AMYLASE in the last 168 hours. Recent Labs  Lab 06/30/20 0513  AMMONIA 15   Diabetic: No results for input(s): HGBA1C in the last 72 hours. No results for input(s): GLUCAP in the last 168 hours. Cardiac Enzymes: No results for input(s): CKTOTAL, CKMB, CKMBINDEX, TROPONINI in the last 168 hours. No results for input(s): PROBNP in the last 8760 hours. Coagulation Profile: No results for input(s): INR, PROTIME in the last 168 hours. Thyroid Function Tests: No results for input(s): TSH, T4TOTAL, FREET4, T3FREE, THYROIDAB in the last 72 hours. Lipid Profile: No results for input(s): CHOL, HDL, LDLCALC, TRIG, CHOLHDL, LDLDIRECT in the last 72 hours. Anemia Panel: No results for input(s): VITAMINB12, FOLATE, FERRITIN, TIBC, IRON, RETICCTPCT in the last 72 hours. Urine analysis:    Component Value Date/Time   COLORURINE AMBER (A) 08/20/2019 1608   APPEARANCEUR CLEAR 08/20/2019 1608   LABSPEC 1.024 08/20/2019 1608   PHURINE 5.0 08/20/2019 1608   GLUCOSEU 50 (A) 08/20/2019 1608   HGBUR NEGATIVE 08/20/2019 1608   BILIRUBINUR NEGATIVE 08/20/2019 1608   KETONESUR 80 (A) 08/20/2019 1608   PROTEINUR NEGATIVE 08/20/2019 1608    UROBILINOGEN 0.2 08/26/2014 1425   NITRITE NEGATIVE 08/20/2019 1608   LEUKOCYTESUR NEGATIVE 08/20/2019 1608   Sepsis Labs: Invalid input(s): PROCALCITONIN, [place]  Microbiology: Recent Results (from the past 240 hour(s))  SARS Coronavirus 2 by RT PCR (hospital order, performed in [facility] hospital lab) Nasopharyngeal Nasopharyngeal Swab     Status: None   Collection Time: 06/27/20 10:45 AM   Specimen: Nasopharyngeal Swab  Result Value Ref Range Status   SARS Coronavirus 2 NEGATIVE NEGATIVE Final    Comment: (NOTE) SARS-CoV-2 target nucleic acids are NOT DETECTED.  The SARS-CoV-2 RNA is generally detectable in upper and lower respiratory specimens during the acute phase of infection. The lowest concentration of SARS-CoV-2 viral copies this assay can detect is 250 copies / mL. A negative result does not preclude SARS-CoV-2 infection and should not be used as the sole basis for treatment or other patient management decisions.  A negative result may occur with improper specimen collection / handling, submission of specimen other than nasopharyngeal swab, presence of viral mutation(s) within the areas targeted by this assay, and inadequate number of viral copies (<250 copies / mL). A negative result must be combined with clinical observations, patient history, and epidemiological information.  Fact Sheet for Patients:   StrictlyIdeas.no  Fact Sheet for Healthcare Providers: BankingDealers.co.za  This test is not yet approved or  cleared by the [place] FDA and has been authorized for detection and/or diagnosis of SARS-CoV-2 by FDA under an Emergency Use Authorization (EUA).  This EUA will remain in effect (meaning this test can be used) for the duration of the COVID-19 declaration under Section 564(b)(1) of the Act, 21 U.S.C. section 360bbb-3(b)(1), unless the authorization is terminated or revoked sooner.  Performed at  [facility], [place] [address], [place], [place] [address]     Radiology Studies: No results found.  Ishmael Holter, MD [facility]  If 7PM-7AM, please contact night-coverage [email or web address] 07/05/2020, 10:57 AM

## 2020-07-05 NOTE — TOC Progression Note (Addendum)
Transition of Care [facility]) - Progression Note    Patient Details  Name: Danny Kerr MRN: [id number] Date of Birth: 1956-04-12  Transition of [facility]) CM/SW Contact  Ross Ludwig, [place] Phone Number: 07/05/2020, 10:17 AM  Clinical Narrative:     CSW received a message from patient's wife, she was asking for the updates on bed offers.  CSW updated patient's wife, that several offers were made, and several denied.  CSW awaiting for decision from other facilities.  12:30pm  CSW spoke to patient's wife, and she chose [facility] for short term rehab.  CSW spoke to [facility] they can accept patient today if he is medically ready for discharge.  1pm  CSW spoke to patient, and he stated that he feels very nauseous and not feeling well, he did not feel comfortable leaving today.  CSW updated bedside nurse and physician.  Discharge cancelled for patient, hopefully he will be able to discharge tomorrow.  CSW updated patient's wife and SNF.    Expected Discharge Plan: [place] Barriers to Discharge: No Barriers Identified  Expected Discharge Plan and Services Expected Discharge Plan: [place] arrangements for the past 2 months: Single Family Home                                       Social Determinants of Health (SDOH) Interventions    Readmission Risk Interventions No flowsheet data found.

## 2020-07-05 NOTE — Plan of Care (Signed)

## 2020-07-06 MED ORDER — ADULT MULTIVITAMIN W/MINERALS CH: 1.0000 | ORAL_TABLET | Freq: Every day | ORAL | Status: DC

## 2020-07-06 MED ORDER — ENSURE ENLIVE PO LIQD: 237.0000 mL | Freq: Two times a day (BID) | ORAL | 12 refills | Status: DC

## 2020-07-06 MED ORDER — FOLIC ACID 1 MG PO TABS: 1.0000 mg | ORAL_TABLET | Freq: Every day | ORAL | Status: DC

## 2020-07-06 MED ORDER — THIAMINE HCL 100 MG PO TABS: 100.0000 mg | ORAL_TABLET | Freq: Every day | ORAL | Status: DC

## 2020-07-06 NOTE — TOC Transition Note (Signed)
Transition of Care [facility]) - CM/SW Discharge Note   Patient Details  Name: LIO WEHRLY MRN: [id number] Date of Birth: [birth year]  Transition of Care [facility]) CM/SW Contact:  Ross Ludwig, LCSW Phone Number: 07/06/2020, 11:42 AM   Clinical Narrative:     Patient to be d/c'ed today to [facility] room 3250.  Patient and family agreeable to plans will transport via ems RN to call report to [phone].  CSW spoke to patient's wife Belenda Cruise and she is aware that patient is discharging today.  Final next level of care: Skilled Nursing Facility Barriers to Discharge: Barriers Resolved   Patient Goals and CMS Choice Patient states their goals for this hospitalization and ongoing recovery are:: To go to SNF then return back home with home health. CMS Medicare.gov Compare Post Acute Care list provided to:: Patient Represenative (must comment) Choice offered to / list presented to : Patient, Spouse  Discharge Placement PASRR number recieved: 07/04/20            Patient chooses bed at: [facility] Patient to be transferred to facility by: [facility] Name of family member notified: Wife Belenda Cruise    [phone] Patient and family notified of of transfer: 07/06/20  Discharge Plan and Services                  DME Agency: NA       HH Arranged: NA          Social Determinants of Health (SDOH) Interventions     Readmission Risk Interventions No flowsheet data found.

## 2020-07-06 NOTE — Plan of Care (Signed)

## 2020-07-06 NOTE — Plan of Care (Signed)

## 2020-07-06 NOTE — Discharge Summary (Signed)
Physician Discharge Summary  Danny Kerr [id number] DOB: [birth year] DOA: 06/27/2020  PCP: [name], MD  Admit date: 06/27/2020 Discharge date: 07/06/2020  Admitted From: Home Disposition: SNF  Recommendations for Outpatient Follow-up:  1. Follow ups as below. 2. Please obtain CBC/BMP/Mag at follow up 3. Please follow up on the following pending results: None  Discharge Condition: Stable CODE STATUS: Full code   Hospital Course: 64 y.o.malewith a history of alcoholism who presented on the morning of 9/6 by EMS due to alcohol withdrawal having had his last drink of vodka the prior evening. He was hemodynamically stable, afebrile, with alcohol level of 133. K 3.3, magnesium 1.7, platelets 106, TBili 1.3. He was given ativan, normal saline, vitamins, and admitted for alcohol withdrawal on CIWA protocol.   Patient had elevated CIWA scores.  Started on Librium taper with as needed Ativan.  He completed Librium taper course and remained stable off as needed Ativan.  Patient was evaluated by therapy who recommended SNF.   Discharge Diagnoses:  Alcoholism/alcohol withdrawal-reportedly drinks a quart of vodka per day.  Attempted multiple rehabilitation programs without significant success.  Completed Librium taper and as needed Ativan.  Stable. -Continue multivitamins, thiamine and folic acid -Continue feeding supplements  Cognitive decline: Per ex-wife report.  Likely due to alcohol.  No clinical evidence of [place].  Received high-dose IV thiamine.  Discharged on p.o. thiamine.  Elevated liver enzymes/hyperbilirubinemia-likely alcoholic hepatitis.    Bilirubin improved.  Seems to be chronic.  Hypokalemia: Resolved. -Continue p.o. KCl  Mild hyponatremia: Resolved.  Likely due to alcohol  Normocytic anemia: Likely from alcohol.  H&H relatively stable.  Anemia panel basically normal.  Thrombocytopenia: Likely alcohol induced.  Improving.  No signs of  bleeding.  Generalized weakness/debility: per wife, they are separated and he lives by himself. She doesn't feel he can take care of himself. She noted blood everywhere when she went to check on him at his apartment before this admission. She thinks he might fell.  -Discharge to SNF. -Continue therapy and vitamin B12  Anxiety: Stable -Continue home Zoloft.  Body mass index is 23.75 kg/m. -Continue ensure, multivitamin, thiamine and folic acid  Body mass index is 23.75 kg/m. Nutrition Problem: Increased nutrient needs Etiology:  (ETOH abuse) Signs/Symptoms: estimated needs Interventions: Ensure Enlive (each supplement provides 350kcal and 20 grams of protein), Magic cup      Discharge Exam: Vitals:   07/05/20 2050 07/06/20 0600  BP: 116/75 (!) 142/76  Pulse: 71 67  Resp: 20 20  Temp: 97.9 F (36.6 C) 97.8 F (36.6 C)  SpO2: 99% 100%    GENERAL: No apparent distress.  Nontoxic. HEENT: MMM.  Vision and hearing grossly intact.  NECK: Supple.  No apparent JVD.  RESP:  No IWOB.  Fair aeration bilaterally. CVS:  RRR. Heart sounds normal.  ABD/GI/GU: Bowel sounds present. Soft. Non tender.  MSK/EXT:  Moves extremities. No apparent deformity. No edema.  SKIN: no apparent skin lesion or wound NEURO: Awake, alert and oriented appropriately.  No apparent focal neuro deficit. PSYCH: Calm. Normal affect.   Discharge Instructions  Discharge Instructions    Diet - low sodium heart healthy   Complete by: As directed    Diet general   Complete by: As directed    Increase activity slowly   Complete by: As directed    No dressing needed   Complete by: As directed      Allergies as of 07/06/2020   No Known Allergies  Medication List    TAKE these medications   feeding supplement (ENSURE ENLIVE) Liqd Take 237 mLs by mouth 2 (two) times daily between meals.   folic acid 1 MG tablet Commonly known as: FOLVITE Take 1 tablet (1 mg total) by mouth daily. Start taking  on: July 07, 2020   multivitamin with minerals Tabs tablet Take 1 tablet by mouth daily. Start taking on: July 07, 2020   naltrexone 50 MG tablet Commonly known as: DEPADE Take 2 tablets (100 mg total) by mouth daily.   Potassium Chloride ER 20 MEQ Tbcr Take 1 tablet by mouth 2 (two) times daily.   sertraline 50 MG tablet Commonly known as: ZOLOFT Take 1 tablet (50 mg total) by mouth daily.   thiamine 100 MG tablet Take 1 tablet (100 mg total) by mouth daily. Start taking on: July 07, 2020            Discharge Care Instructions  (From admission, onward)         Start     Ordered   07/06/20 0000  No dressing needed        07/06/20 1056          Consultations:  None  Procedures/Studies:    No results found.     The results of significant diagnostics from this hospitalization (including imaging, microbiology, ancillary and laboratory) are listed below for reference.     Microbiology: Recent Results (from the past 240 hour(s))  SARS Coronavirus 2 by RT PCR (hospital order, performed in [facility] hospital lab) Nasopharyngeal Nasopharyngeal Swab     Status: None   Collection Time: 06/27/20 10:45 AM   Specimen: Nasopharyngeal Swab  Result Value Ref Range Status   SARS Coronavirus 2 NEGATIVE NEGATIVE Final    Comment: (NOTE) SARS-CoV-2 target nucleic acids are NOT DETECTED.  The SARS-CoV-2 RNA is generally detectable in upper and lower respiratory specimens during the acute phase of infection. The lowest concentration of SARS-CoV-2 viral copies this assay can detect is 250 copies / mL. A negative result does not preclude SARS-CoV-2 infection and should not be used as the sole basis for treatment or other patient management decisions.  A negative result may occur with improper specimen collection / handling, submission of specimen other than nasopharyngeal swab, presence of viral mutation(s) within the areas targeted by this assay, and  inadequate number of viral copies (<250 copies / mL). A negative result must be combined with clinical observations, patient history, and epidemiological information.  Fact Sheet for Patients:   StrictlyIdeas.no  Fact Sheet for Healthcare Providers: BankingDealers.co.za  This test is not yet approved or  cleared by the [place] FDA and has been authorized for detection and/or diagnosis of SARS-CoV-2 by FDA under an Emergency Use Authorization (EUA).  This EUA will remain in effect (meaning this test can be used) for the duration of the COVID-19 declaration under Section 564(b)(1) of the Act, 21 U.S.C. section 360bbb-3(b)(1), unless the authorization is terminated or revoked sooner.  Performed at [facility], [place] [address], [place], [place] [address]   SARS Coronavirus 2 by RT PCR (hospital order, performed in [facility] hospital lab) Nasopharyngeal Nasopharyngeal Swab     Status: None   Collection Time: 07/05/20  5:12 PM   Specimen: Nasopharyngeal Swab  Result Value Ref Range Status   SARS Coronavirus 2 NEGATIVE NEGATIVE Final    Comment: (NOTE) SARS-CoV-2 target nucleic acids are NOT DETECTED.  The SARS-CoV-2 RNA is generally detectable in  upper and lower respiratory specimens during the acute phase of infection. The lowest concentration of SARS-CoV-2 viral copies this assay can detect is 250 copies / mL. A negative result does not preclude SARS-CoV-2 infection and should not be used as the sole basis for treatment or other patient management decisions.  A negative result may occur with improper specimen collection / handling, submission of specimen other than nasopharyngeal swab, presence of viral mutation(s) within the areas targeted by this assay, and inadequate number of viral copies (<250 copies / mL). A negative result must be combined with clinical observations, patient history, and epidemiological  information.  Fact Sheet for Patients:   StrictlyIdeas.no  Fact Sheet for Healthcare Providers: BankingDealers.co.za  This test is not yet approved or  cleared by the [place] FDA and has been authorized for detection and/or diagnosis of SARS-CoV-2 by FDA under an Emergency Use Authorization (EUA).  This EUA will remain in effect (meaning this test can be used) for the duration of the COVID-19 declaration under Section 564(b)(1) of the Act, 21 U.S.C. section 360bbb-3(b)(1), unless the authorization is terminated or revoked sooner.  Performed at [facility], [place] [address], [place], [place] [address]      Labs: BNP (last 3 results) No results for input(s): BNP in the last 8760 hours. Basic Metabolic Panel: Recent Labs  Lab 06/30/20 0513 07/01/20 0456 07/03/20 0809  NA 134* 135 138  K 4.0 4.5 4.2  CL 102 99 98  CO2 24 27 28   GLUCOSE 101* 100* 101*  BUN 11 8 11   CREATININE 0.56* 0.52* 0.64  CALCIUM 9.1 9.3 9.4  MG 1.8 1.9 2.1  PHOS 3.3 4.1  --    Liver Function Tests: Recent Labs  Lab 06/30/20 0513 07/01/20 0456  AST  --  31  ALT  --  13  ALKPHOS  --  105  BILITOT  --  1.5*  PROT  --  6.9  ALBUMIN 3.6 3.8   No results for input(s): LIPASE, AMYLASE in the last 168 hours. Recent Labs  Lab 06/30/20 0513  AMMONIA 15   CBC: Recent Labs  Lab 07/01/20 0456  WBC 4.3  HGB 12.9*  HCT 38.7*  MCV 101.6*  PLT 139*   Cardiac Enzymes: No results for input(s): CKTOTAL, CKMB, CKMBINDEX, TROPONINI in the last 168 hours. BNP: Invalid input(s): POCBNP CBG: No results for input(s): GLUCAP in the last 168 hours. D-Dimer No results for input(s): DDIMER in the last 72 hours. Hgb A1c No results for input(s): HGBA1C in the last 72 hours. Lipid Profile No results for input(s): CHOL, HDL, LDLCALC, TRIG, CHOLHDL, LDLDIRECT in the last 72 hours. Thyroid function studies No results for input(s):  TSH, T4TOTAL, T3FREE, THYROIDAB in the last 72 hours.  Invalid input(s): FREET3 Anemia work up No results for input(s): VITAMINB12, FOLATE, FERRITIN, TIBC, IRON, RETICCTPCT in the last 72 hours. Urinalysis    Component Value Date/Time   COLORURINE AMBER (A) 08/20/2019 1608   APPEARANCEUR CLEAR 08/20/2019 1608   LABSPEC 1.024 08/20/2019 1608   PHURINE 5.0 08/20/2019 1608   GLUCOSEU 50 (A) 08/20/2019 1608   HGBUR NEGATIVE 08/20/2019 1608   BILIRUBINUR NEGATIVE 08/20/2019 1608   KETONESUR 80 (A) 08/20/2019 1608   PROTEINUR NEGATIVE 08/20/2019 1608   UROBILINOGEN 0.2 08/26/2014 1425   NITRITE NEGATIVE 08/20/2019 1608   LEUKOCYTESUR NEGATIVE 08/20/2019 1608   Sepsis Labs Invalid input(s): PROCALCITONIN,  WBC,  LACTICIDVEN   Time coordinating discharge: 35 minutes  SIGNED:  Charlesetta Ivory  Cyndia Skeeters, MD  [facility] 07/06/2020, 10:56 AM  If 7PM-7AM, please contact night-coverage [email or web address]

## 2020-07-15 ENCOUNTER — Inpatient Hospital Stay (HOSPITAL_COMMUNITY)
Admission: EM | Admit: 2020-07-15 | Discharge: 2020-07-29 | DRG: 956 | Disposition: A | Discharge status: Skilled Nursing Facility | Payer: No Typology Code available for payment source | Attending: General Surgery | Admitting: General Surgery

## 2020-07-15 ENCOUNTER — Emergency Department (HOSPITAL_COMMUNITY): Payer: No Typology Code available for payment source

## 2020-07-15 DIAGNOSIS — J939 Pneumothorax, unspecified: Secondary | ICD-10-CM

## 2020-07-15 DIAGNOSIS — S72362A Displaced segmental fracture of shaft of left femur, initial encounter for closed fracture: Secondary | ICD-10-CM | POA: Diagnosis present

## 2020-07-15 DIAGNOSIS — T1490XA Injury, unspecified, initial encounter: Principal | ICD-10-CM

## 2020-07-15 DIAGNOSIS — S32422A Displaced fracture of posterior wall of left acetabulum, initial encounter for closed fracture: Secondary | ICD-10-CM | POA: Diagnosis present

## 2020-07-15 DIAGNOSIS — R0902 Hypoxemia: Secondary | ICD-10-CM

## 2020-07-15 DIAGNOSIS — J9690 Respiratory failure, unspecified, unspecified whether with hypoxia or hypercapnia: Secondary | ICD-10-CM

## 2020-07-15 DIAGNOSIS — S32421A Displaced fracture of posterior wall of right acetabulum, initial encounter for closed fracture: Secondary | ICD-10-CM | POA: Diagnosis present

## 2020-07-15 DIAGNOSIS — Z419 Encounter for procedure for purposes other than remedying health state, unspecified: Secondary | ICD-10-CM

## 2020-07-15 DIAGNOSIS — J181 Lobar pneumonia, unspecified organism: Secondary | ICD-10-CM

## 2020-07-15 DIAGNOSIS — R52 Pain, unspecified: Secondary | ICD-10-CM

## 2020-07-15 DIAGNOSIS — J90 Pleural effusion, not elsewhere classified: Secondary | ICD-10-CM

## 2020-07-15 DIAGNOSIS — T148XXA Other injury of unspecified body region, initial encounter: Secondary | ICD-10-CM

## 2020-07-15 DIAGNOSIS — M62838 Other muscle spasm: Secondary | ICD-10-CM | POA: Diagnosis present

## 2020-07-15 DIAGNOSIS — E870 Hyperosmolality and hypernatremia: Secondary | ICD-10-CM | POA: Diagnosis present

## 2020-07-15 DIAGNOSIS — Z20822 Contact with and (suspected) exposure to covid-19: Secondary | ICD-10-CM | POA: Diagnosis present

## 2020-07-15 DIAGNOSIS — D62 Acute posthemorrhagic anemia: Secondary | ICD-10-CM | POA: Diagnosis present

## 2020-07-15 DIAGNOSIS — S22089A Unspecified fracture of T11-T12 vertebra, initial encounter for closed fracture: Secondary | ICD-10-CM | POA: Diagnosis present

## 2020-07-15 DIAGNOSIS — Z96651 Presence of right artificial knee joint: Secondary | ICD-10-CM | POA: Diagnosis present

## 2020-07-15 DIAGNOSIS — I959 Hypotension, unspecified: Secondary | ICD-10-CM | POA: Diagnosis present

## 2020-07-15 DIAGNOSIS — I361 Nonrheumatic tricuspid (valve) insufficiency: Secondary | ICD-10-CM | POA: Diagnosis not present

## 2020-07-15 DIAGNOSIS — Y92413 State road as the place of occurrence of the external cause: Secondary | ICD-10-CM

## 2020-07-15 DIAGNOSIS — I4510 Unspecified right bundle-branch block: Secondary | ICD-10-CM | POA: Diagnosis present

## 2020-07-15 DIAGNOSIS — S27321A Contusion of lung, unilateral, initial encounter: Secondary | ICD-10-CM | POA: Diagnosis present

## 2020-07-15 DIAGNOSIS — E876 Hypokalemia: Secondary | ICD-10-CM | POA: Diagnosis present

## 2020-07-15 DIAGNOSIS — F10239 Alcohol dependence with withdrawal, unspecified: Secondary | ICD-10-CM | POA: Diagnosis present

## 2020-07-15 DIAGNOSIS — S0231XA Fracture of orbital floor, right side, initial encounter for closed fracture: Secondary | ICD-10-CM | POA: Diagnosis present

## 2020-07-15 DIAGNOSIS — S0181XA Laceration without foreign body of other part of head, initial encounter: Secondary | ICD-10-CM | POA: Diagnosis present

## 2020-07-15 DIAGNOSIS — I480 Paroxysmal atrial fibrillation: Secondary | ICD-10-CM | POA: Diagnosis not present

## 2020-07-15 DIAGNOSIS — S37812A Contusion of adrenal gland, initial encounter: Secondary | ICD-10-CM | POA: Diagnosis present

## 2020-07-15 DIAGNOSIS — S0101XA Laceration without foreign body of scalp, initial encounter: Secondary | ICD-10-CM | POA: Diagnosis present

## 2020-07-15 DIAGNOSIS — S2249XA Multiple fractures of ribs, unspecified side, initial encounter for closed fracture: Secondary | ICD-10-CM

## 2020-07-15 DIAGNOSIS — E559 Vitamin D deficiency, unspecified: Secondary | ICD-10-CM | POA: Diagnosis present

## 2020-07-15 DIAGNOSIS — S2243XA Multiple fractures of ribs, bilateral, initial encounter for closed fracture: Secondary | ICD-10-CM | POA: Diagnosis present

## 2020-07-15 DIAGNOSIS — J948 Other specified pleural conditions: Secondary | ICD-10-CM | POA: Diagnosis present

## 2020-07-15 DIAGNOSIS — J189 Pneumonia, unspecified organism: Secondary | ICD-10-CM | POA: Diagnosis present

## 2020-07-15 DIAGNOSIS — S1091XA Abrasion of unspecified part of neck, initial encounter: Secondary | ICD-10-CM | POA: Diagnosis present

## 2020-07-15 DIAGNOSIS — J982 Interstitial emphysema: Secondary | ICD-10-CM | POA: Diagnosis present

## 2020-07-15 DIAGNOSIS — I4891 Unspecified atrial fibrillation: Secondary | ICD-10-CM | POA: Diagnosis not present

## 2020-07-15 DIAGNOSIS — S36114A Minor laceration of liver, initial encounter: Secondary | ICD-10-CM | POA: Diagnosis present

## 2020-07-15 DIAGNOSIS — S272XXA Traumatic hemopneumothorax, initial encounter: Secondary | ICD-10-CM | POA: Diagnosis present

## 2020-07-15 DIAGNOSIS — R7989 Other specified abnormal findings of blood chemistry: Secondary | ICD-10-CM | POA: Diagnosis not present

## 2020-07-15 DIAGNOSIS — E8889 Other specified metabolic disorders: Secondary | ICD-10-CM | POA: Diagnosis present

## 2020-07-15 DIAGNOSIS — S42211A Unspecified displaced fracture of surgical neck of right humerus, initial encounter for closed fracture: Secondary | ICD-10-CM | POA: Diagnosis present

## 2020-07-15 DIAGNOSIS — J9602 Acute respiratory failure with hypercapnia: Secondary | ICD-10-CM | POA: Diagnosis not present

## 2020-07-15 DIAGNOSIS — R339 Retention of urine, unspecified: Secondary | ICD-10-CM | POA: Diagnosis present

## 2020-07-15 DIAGNOSIS — Z87891 Personal history of nicotine dependence: Secondary | ICD-10-CM

## 2020-07-15 DIAGNOSIS — R29810 Facial weakness: Secondary | ICD-10-CM | POA: Diagnosis not present

## 2020-07-15 DIAGNOSIS — B9561 Methicillin susceptible Staphylococcus aureus infection as the cause of diseases classified elsewhere: Secondary | ICD-10-CM | POA: Diagnosis present

## 2020-07-15 DIAGNOSIS — F40240 Claustrophobia: Secondary | ICD-10-CM | POA: Diagnosis present

## 2020-07-15 LAB — CBC
HCT: 42.5 % (ref 39.0–52.0)
Hemoglobin: 14.1 g/dL (ref 13.0–17.0)
MCH: 34 pg (ref 26.0–34.0)
MCHC: 33.2 g/dL (ref 30.0–36.0)
MCV: 102.4 fL — ABNORMAL HIGH (ref 80.0–100.0)
Platelets: 427 10*3/uL — ABNORMAL HIGH (ref 150–400)
RBC: 4.15 MIL/uL — ABNORMAL LOW (ref 4.22–5.81)
RDW: 13.5 % (ref 11.5–15.5)
WBC: 6.8 10*3/uL (ref 4.0–10.5)
nRBC: 0 % (ref 0.0–0.2)

## 2020-07-15 LAB — COMPREHENSIVE METABOLIC PANEL
ALT: 52 U/L — ABNORMAL HIGH (ref 0–44)
AST: 208 U/L — ABNORMAL HIGH (ref 15–41)
Albumin: 3.4 g/dL — ABNORMAL LOW (ref 3.5–5.0)
Alkaline Phosphatase: 85 U/L (ref 38–126)
Anion gap: 9 (ref 5–15)
BUN: <5 mg/dL — ABNORMAL LOW (ref 8–23)
CO2: 29 mmol/L (ref 22–32)
Calcium: 8.6 mg/dL — ABNORMAL LOW (ref 8.9–10.3)
Chloride: 107 mmol/L (ref 98–111)
Creatinine, Ser: 0.76 mg/dL (ref 0.61–1.24)
GFR calc Af Amer: >60 mL/min (ref >60)
GFR calc non Af Amer: 55 mL/min — ABNORMAL LOW (ref >60)
Glucose, Bld: 124 mg/dL — ABNORMAL HIGH (ref 70–99)
Potassium: 4 mmol/L (ref 3.5–5.1)
Sodium: 145 mmol/L (ref 135–145)
Total Bilirubin: 0.8 mg/dL (ref 0.3–1.2)
Total Protein: 6.3 g/dL — ABNORMAL LOW (ref 6.5–8.1)

## 2020-07-15 LAB — LACTIC ACID, PLASMA: Lactic Acid, Venous: 2.9 mmol/L (ref 0.5–1.9)

## 2020-07-15 LAB — I-STAT CHEM 8, ED
BUN: 4 mg/dL — ABNORMAL LOW (ref 8–23)
Calcium, Ion: 1 mmol/L — ABNORMAL LOW (ref 1.15–1.40)
Chloride: 104 mmol/L (ref 98–111)
Creatinine, Ser: 1.4 mg/dL — ABNORMAL HIGH (ref 0.61–1.24)
Glucose, Bld: 121 mg/dL — ABNORMAL HIGH (ref 70–99)
HCT: 41 % (ref 39.0–52.0)
Hemoglobin: 13.9 g/dL (ref 13.0–17.0)
Potassium: 4.1 mmol/L (ref 3.5–5.1)
Sodium: 144 mmol/L (ref 135–145)
TCO2: 27 mmol/L (ref 22–32)

## 2020-07-15 LAB — URINALYSIS, ROUTINE W REFLEX MICROSCOPIC
Bacteria, UA: NONE SEEN
Bilirubin Urine: NEGATIVE
Glucose, UA: NEGATIVE mg/dL
Ketones, ur: NEGATIVE mg/dL
Leukocytes,Ua: NEGATIVE
Nitrite: NEGATIVE
Protein, ur: 30 mg/dL — AB
Specific Gravity, Urine: 1.02 (ref 1.005–1.030)
pH: 6 (ref 5.0–8.0)

## 2020-07-15 LAB — SAMPLE TO BLOOD BANK

## 2020-07-15 LAB — TRAUMA TEG PANEL
CFF Max Amplitude: 23.1 mm (ref 15–32)
Citrated Kaolin (R): 5.7 min (ref 4.6–9.1)
Citrated Rapid TEG (MA): 66.2 mm (ref 52–70)
Lysis at 30 Minutes: 0.1 % (ref 0.0–2.6)

## 2020-07-15 LAB — PROTIME-INR
INR: 1 (ref 0.8–1.2)
Prothrombin Time: 13.1 seconds (ref 11.4–15.2)

## 2020-07-15 LAB — SURGICAL PCR SCREEN
MRSA, PCR: NEGATIVE
Staphylococcus aureus: POSITIVE — AB

## 2020-07-15 LAB — RESPIRATORY PANEL BY RT PCR (FLU A&B, COVID)
Influenza A by PCR: NEGATIVE
Influenza B by PCR: NEGATIVE
SARS Coronavirus 2 by RT PCR: NEGATIVE

## 2020-07-15 LAB — ETHANOL: Alcohol, Ethyl (B): 329 mg/dL (ref <10)

## 2020-07-15 MED ORDER — FENTANYL CITRATE (PF) 100 MCG/2ML IJ SOLN
INTRAMUSCULAR | Status: AC | PRN
  Administered 2020-07-15: 75 ug via INTRAVENOUS

## 2020-07-15 MED ORDER — LIDOCAINE HCL (PF) 1 % IJ SOLN
INTRAMUSCULAR | Status: AC
  Filled 2020-07-15: qty 30

## 2020-07-15 MED ORDER — THIAMINE HCL 100 MG PO TABS
100.0000 mg | ORAL_TABLET | Freq: Every day | ORAL | Status: DC
  Administered 2020-07-15 – 2020-07-29 (×7): 100 mg via ORAL
  Filled 2020-07-15 (×8): qty 1

## 2020-07-15 MED ORDER — BACITRACIN ZINC 500 UNIT/GM EX OINT
TOPICAL_OINTMENT | Freq: Two times a day (BID) | CUTANEOUS | Status: DC
  Administered 2020-07-16 – 2020-07-28 (×5): 1 via TOPICAL
  Filled 2020-07-15 (×2): qty 28.4

## 2020-07-15 MED ORDER — ACETAMINOPHEN 325 MG PO TABS
650.0000 mg | ORAL_TABLET | Freq: Four times a day (QID) | ORAL | Status: DC
  Administered 2020-07-15 – 2020-07-22 (×22): 650 mg via ORAL
  Filled 2020-07-15 (×23): qty 2

## 2020-07-15 MED ORDER — HYDROMORPHONE HCL 1 MG/ML IJ SOLN
1.0000 mg | Freq: Once | INTRAMUSCULAR | Status: AC
  Administered 2020-07-15: 1 mg via INTRAVENOUS

## 2020-07-15 MED ORDER — ADULT MULTIVITAMIN W/MINERALS CH
1.0000 | ORAL_TABLET | Freq: Every day | ORAL | Status: DC
  Administered 2020-07-15 – 2020-07-29 (×13): 1 via ORAL
  Filled 2020-07-15 (×13): qty 1

## 2020-07-15 MED ORDER — HYDROMORPHONE HCL 1 MG/ML IJ SOLN
0.5000 mg | INTRAMUSCULAR | Status: DC | PRN
  Administered 2020-07-15 – 2020-07-16 (×3): 0.5 mg via INTRAVENOUS
  Filled 2020-07-15 (×3): qty 1

## 2020-07-15 MED ORDER — METHOCARBAMOL 500 MG PO TABS
500.0000 mg | ORAL_TABLET | Freq: Four times a day (QID) | ORAL | Status: DC | PRN
  Administered 2020-07-16 – 2020-07-19 (×2): 500 mg via ORAL
  Filled 2020-07-15 (×2): qty 1

## 2020-07-15 MED ORDER — HYDROMORPHONE 1 MG/ML IV SOLN: INTRAVENOUS | Status: DC

## 2020-07-15 MED ORDER — FENTANYL CITRATE (PF) 100 MCG/2ML IJ SOLN
INTRAMUSCULAR | Status: AC
  Filled 2020-07-15: qty 2

## 2020-07-15 MED ORDER — NALOXONE HCL 0.4 MG/ML IJ SOLN: 0.4000 mg | INTRAMUSCULAR | Status: DC | PRN

## 2020-07-15 MED ORDER — DIPHENHYDRAMINE HCL 12.5 MG/5ML PO ELIX: 12.5000 mg | ORAL_SOLUTION | Freq: Four times a day (QID) | ORAL | Status: DC | PRN

## 2020-07-15 MED ORDER — SODIUM CHLORIDE 0.9 % IV SOLN: INTRAVENOUS | Status: DC

## 2020-07-15 MED ORDER — SODIUM CHLORIDE 0.9% FLUSH: 9.0000 mL | INTRAVENOUS | Status: DC | PRN

## 2020-07-15 MED ORDER — OXYCODONE HCL 5 MG PO TABS
5.0000 mg | ORAL_TABLET | ORAL | Status: DC | PRN
  Administered 2020-07-15 – 2020-07-16 (×2): 5 mg via ORAL
  Filled 2020-07-15 (×2): qty 1

## 2020-07-15 MED ORDER — LORAZEPAM 2 MG/ML IJ SOLN
1.0000 mg | INTRAMUSCULAR | Status: AC | PRN
  Administered 2020-07-15: 1 mg via INTRAVENOUS
  Administered 2020-07-16 – 2020-07-17 (×2): 2 mg via INTRAVENOUS
  Administered 2020-07-17: 4 mg via INTRAVENOUS
  Administered 2020-07-17 – 2020-07-18 (×4): 2 mg via INTRAVENOUS
  Administered 2020-07-19: 4 mg via INTRAVENOUS
  Administered 2020-07-19 (×2): 2 mg via INTRAVENOUS
  Administered 2020-07-19 – 2020-07-21 (×7): 4 mg via INTRAVENOUS
  Administered 2020-07-21 (×4): 2 mg via INTRAVENOUS
  Filled 2020-07-15 (×2): qty 1
  Filled 2020-07-15 (×2): qty 2
  Filled 2020-07-15 (×2): qty 1
  Filled 2020-07-15: qty 2
  Filled 2020-07-15 (×2): qty 1
  Filled 2020-07-15 (×4): qty 2
  Filled 2020-07-15 (×4): qty 1
  Filled 2020-07-15: qty 2
  Filled 2020-07-15 (×6): qty 1

## 2020-07-15 MED ORDER — IOHEXOL 300 MG/ML  SOLN
100.0000 mL | Freq: Once | INTRAMUSCULAR | Status: AC | PRN
  Administered 2020-07-15: 100 mL via INTRAVENOUS

## 2020-07-15 MED ORDER — DIPHENHYDRAMINE HCL 50 MG/ML IJ SOLN: 12.5000 mg | Freq: Four times a day (QID) | INTRAMUSCULAR | Status: DC | PRN

## 2020-07-15 MED ORDER — HYDROMORPHONE HCL 1 MG/ML IJ SOLN
INTRAMUSCULAR | Status: AC | PRN
Start: 2020-07-15 — End: 2020-07-15
  Administered 2020-07-15: 1 mg via INTRAVENOUS

## 2020-07-15 MED ORDER — ONDANSETRON HCL 4 MG/2ML IJ SOLN: 4.0000 mg | Freq: Four times a day (QID) | INTRAMUSCULAR | Status: DC | PRN

## 2020-07-15 MED ORDER — THIAMINE HCL 100 MG/ML IJ SOLN
100.0000 mg | Freq: Every day | INTRAMUSCULAR | Status: DC
  Administered 2020-07-16 – 2020-07-23 (×8): 100 mg via INTRAVENOUS
  Filled 2020-07-15 (×8): qty 2

## 2020-07-15 MED ORDER — FOLIC ACID 1 MG PO TABS
1.0000 mg | ORAL_TABLET | Freq: Every day | ORAL | Status: DC
  Administered 2020-07-15 – 2020-07-29 (×13): 1 mg via ORAL
  Filled 2020-07-15 (×12): qty 1

## 2020-07-15 MED ORDER — HYDROMORPHONE HCL 1 MG/ML IJ SOLN
1.0000 mg | Freq: Once | INTRAMUSCULAR | Status: DC
  Filled 2020-07-15: qty 1

## 2020-07-15 MED ORDER — LORAZEPAM 1 MG PO TABS
1.0000 mg | ORAL_TABLET | ORAL | Status: AC | PRN
  Administered 2020-07-16 (×2): 2 mg via ORAL
  Filled 2020-07-15 (×2): qty 2
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
## 2020-07-15 NOTE — Progress Notes (Signed)
Orthopedic Tech Progress Note Patient Details:  Danny Kerr 29-Jan-1956 [id number]  Musculoskeletal Traction Type of Traction: Skeletal (Balanced Suspension) Traction Location: LLE Traction Weight: 15 lbs   Post Interventions Patient Tolerated: Fair Instructions Provided: Care of device   [name] 07/15/2020, 8:15 PM
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
## 2020-07-15 NOTE — ED Notes (Signed)
Pt to CT with TRN  
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
## 2020-07-15 NOTE — ED Notes (Signed)
Pt returned from CT °

## 2020-07-15 NOTE — ED Notes (Signed)
Ortho at bedside.
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
## 2020-07-15 NOTE — ED Notes (Signed)
Dr. Windle Guard at bedside
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
## 2020-07-16 ENCOUNTER — Inpatient Hospital Stay (HOSPITAL_COMMUNITY): Payer: No Typology Code available for payment source

## 2020-07-16 ENCOUNTER — Inpatient Hospital Stay (HOSPITAL_COMMUNITY): Payer: No Typology Code available for payment source | Admitting: Certified Registered"

## 2020-07-16 ENCOUNTER — Encounter (HOSPITAL_COMMUNITY): Admission: EM | Disposition: A | Discharge status: Skilled Nursing Facility | Payer: Self-pay | Source: Home / Self Care

## 2020-07-16 ENCOUNTER — Encounter (HOSPITAL_COMMUNITY): Payer: Self-pay

## 2020-07-16 DIAGNOSIS — S72362A Displaced segmental fracture of shaft of left femur, initial encounter for closed fracture: Secondary | ICD-10-CM

## 2020-07-16 DIAGNOSIS — S32421A Displaced fracture of posterior wall of right acetabulum, initial encounter for closed fracture: Secondary | ICD-10-CM | POA: Diagnosis present

## 2020-07-16 DIAGNOSIS — S32422A Displaced fracture of posterior wall of left acetabulum, initial encounter for closed fracture: Secondary | ICD-10-CM | POA: Diagnosis present

## 2020-07-16 HISTORY — DX: Displaced segmental fracture of shaft of left femur, initial encounter for closed fracture: S72.362A

## 2020-07-16 HISTORY — PX: FEMUR IM NAIL: SHX1597

## 2020-07-16 LAB — BPAM RBC
Blood Product Expiration Date: 202110192359
Blood Product Expiration Date: 202110202359
ISSUE DATE / TIME: 202109241622
ISSUE DATE / TIME: 202109241629
Unit Type and Rh: 5100
Unit Type and Rh: 5100

## 2020-07-16 LAB — TYPE AND SCREEN
ABO/RH(D): O NEG
Antibody Screen: NEGATIVE
Unit division: 0
Unit division: 0

## 2020-07-16 LAB — CBC
HCT: 39.6 % (ref 39.0–52.0)
Hemoglobin: 13.6 g/dL (ref 13.0–17.0)
MCH: 33.8 pg (ref 26.0–34.0)
MCHC: 34.3 g/dL (ref 30.0–36.0)
MCV: 98.5 fL (ref 80.0–100.0)
Platelets: 329 10*3/uL (ref 150–400)
RBC: 4.02 MIL/uL — ABNORMAL LOW (ref 4.22–5.81)
RDW: 15.6 % — ABNORMAL HIGH (ref 11.5–15.5)
WBC: 15.4 10*3/uL — ABNORMAL HIGH (ref 4.0–10.5)
nRBC: 0 % (ref 0.0–0.2)

## 2020-07-16 LAB — PREPARE FRESH FROZEN PLASMA
Unit division: 0
Unit division: 0

## 2020-07-16 LAB — BASIC METABOLIC PANEL
Anion gap: 15 (ref 5–15)
BUN: 6 mg/dL — ABNORMAL LOW (ref 8–23)
CO2: 24 mmol/L (ref 22–32)
Calcium: 8.3 mg/dL — ABNORMAL LOW (ref 8.9–10.3)
Chloride: 108 mmol/L (ref 98–111)
Creatinine, Ser: 0.63 mg/dL (ref 0.61–1.24)
GFR calc Af Amer: >60 mL/min (ref >60)
GFR calc non Af Amer: >60 mL/min (ref >60)
Glucose, Bld: 169 mg/dL — ABNORMAL HIGH (ref 70–99)
Potassium: 4 mmol/L (ref 3.5–5.1)
Sodium: 147 mmol/L — ABNORMAL HIGH (ref 135–145)

## 2020-07-16 LAB — APTT: aPTT: 29 seconds (ref 24–36)

## 2020-07-16 LAB — HEMOGLOBIN AND HEMATOCRIT, BLOOD
HCT: 30.7 % — ABNORMAL LOW (ref 39.0–52.0)
Hemoglobin: 10 g/dL — ABNORMAL LOW (ref 13.0–17.0)

## 2020-07-16 LAB — BPAM FFP
Blood Product Expiration Date: 202110042359
Blood Product Expiration Date: 202110062359
ISSUE DATE / TIME: 202109241623
ISSUE DATE / TIME: 202109241630
Unit Type and Rh: 6200
Unit Type and Rh: 6200

## 2020-07-16 LAB — PROTIME-INR
INR: 1.2 (ref 0.8–1.2)
Prothrombin Time: 14.5 seconds (ref 11.4–15.2)

## 2020-07-16 LAB — LACTIC ACID, PLASMA
Lactic Acid, Venous: 2.4 mmol/L (ref 0.5–1.9)
Lactic Acid, Venous: 3.6 mmol/L (ref 0.5–1.9)

## 2020-07-16 LAB — ABO/RH: ABO/RH(D): O NEG

## 2020-07-16 SURGERY — INSERTION, INTRAMEDULLARY ROD, FEMUR
Anesthesia: General | Site: Hip | Laterality: Left

## 2020-07-16 MED ORDER — ROCURONIUM BROMIDE 10 MG/ML (PF) SYRINGE
PREFILLED_SYRINGE | INTRAVENOUS | Status: AC
  Filled 2020-07-16: qty 10

## 2020-07-16 MED ORDER — GLYCOPYRROLATE PF 0.2 MG/ML IJ SOSY
PREFILLED_SYRINGE | INTRAMUSCULAR | Status: AC
  Filled 2020-07-16: qty 1

## 2020-07-16 MED ORDER — CEFAZOLIN SODIUM-DEXTROSE 2-4 GM/100ML-% IV SOLN
INTRAVENOUS | Status: AC
  Filled 2020-07-16: qty 100

## 2020-07-16 MED ORDER — PHENYLEPHRINE 40 MCG/ML (10ML) SYRINGE FOR IV PUSH (FOR BLOOD PRESSURE SUPPORT)
PREFILLED_SYRINGE | INTRAVENOUS | Status: DC | PRN
  Administered 2020-07-16: 80 ug via INTRAVENOUS

## 2020-07-16 MED ORDER — MUPIROCIN 2 % EX OINT
1.0000 "application " | TOPICAL_OINTMENT | Freq: Two times a day (BID) | CUTANEOUS | Status: AC
  Administered 2020-07-16 – 2020-07-20 (×9): 1 via NASAL
  Filled 2020-07-16 (×2): qty 22

## 2020-07-16 MED ORDER — METHOCARBAMOL 1000 MG/10ML IJ SOLN
500.0000 mg | Freq: Three times a day (TID) | INTRAVENOUS | Status: DC | PRN
  Administered 2020-07-18: 500 mg via INTRAVENOUS
  Filled 2020-07-16 (×3): qty 5

## 2020-07-16 MED ORDER — ROCURONIUM BROMIDE 10 MG/ML (PF) SYRINGE
PREFILLED_SYRINGE | INTRAVENOUS | Status: DC | PRN
  Administered 2020-07-16: 100 mg via INTRAVENOUS

## 2020-07-16 MED ORDER — DEXMEDETOMIDINE HCL IN NACL 400 MCG/100ML IV SOLN
0.4000 ug/kg/h | INTRAVENOUS | Status: DC
  Administered 2020-07-16 – 2020-07-18 (×3): 0.4 ug/kg/h via INTRAVENOUS
  Administered 2020-07-18: 0.5 ug/kg/h via INTRAVENOUS
  Administered 2020-07-21: 0.4 ug/kg/h via INTRAVENOUS
  Administered 2020-07-22: 0.8 ug/kg/h via INTRAVENOUS
  Administered 2020-07-22: 0.6 ug/kg/h via INTRAVENOUS
  Filled 2020-07-16 (×7): qty 100

## 2020-07-16 MED ORDER — ALBUMIN HUMAN 5 % IV SOLN: INTRAVENOUS | Status: DC | PRN

## 2020-07-16 MED ORDER — PROPOFOL 10 MG/ML IV BOLUS
INTRAVENOUS | Status: AC
  Filled 2020-07-16: qty 20

## 2020-07-16 MED ORDER — ONDANSETRON HCL 4 MG/2ML IJ SOLN
4.0000 mg | Freq: Four times a day (QID) | INTRAMUSCULAR | Status: DC | PRN
  Administered 2020-07-16 – 2020-07-25 (×2): 4 mg via INTRAVENOUS
  Filled 2020-07-16 (×2): qty 2

## 2020-07-16 MED ORDER — PROPOFOL 10 MG/ML IV BOLUS
INTRAVENOUS | Status: DC | PRN
  Administered 2020-07-16: 130 mg via INTRAVENOUS

## 2020-07-16 MED ORDER — LIDOCAINE 2% (20 MG/ML) 5 ML SYRINGE
INTRAMUSCULAR | Status: DC | PRN
  Administered 2020-07-16: 100 mg via INTRAVENOUS

## 2020-07-16 MED ORDER — FENTANYL CITRATE (PF) 250 MCG/5ML IJ SOLN
INTRAMUSCULAR | Status: AC
Start: 2020-07-16 — End: ?
  Filled 2020-07-16: qty 5

## 2020-07-16 MED ORDER — MIDAZOLAM HCL 2 MG/2ML IJ SOLN
INTRAMUSCULAR | Status: AC
  Filled 2020-07-16: qty 2

## 2020-07-16 MED ORDER — EPHEDRINE SULFATE-NACL 50-0.9 MG/10ML-% IV SOSY
PREFILLED_SYRINGE | INTRAVENOUS | Status: DC | PRN
  Administered 2020-07-16 (×2): 5 mg via INTRAVENOUS

## 2020-07-16 MED ORDER — PHENYLEPHRINE HCL-NACL 10-0.9 MG/250ML-% IV SOLN
INTRAVENOUS | Status: DC | PRN
  Administered 2020-07-16: 30 ug/min via INTRAVENOUS

## 2020-07-16 MED ORDER — ONDANSETRON HCL 4 MG/2ML IJ SOLN
INTRAMUSCULAR | Status: AC
  Filled 2020-07-16: qty 2

## 2020-07-16 MED ORDER — SUGAMMADEX SODIUM 200 MG/2ML IV SOLN
INTRAVENOUS | Status: DC | PRN
  Administered 2020-07-16: 200 mg via INTRAVENOUS

## 2020-07-16 MED ORDER — PHENYLEPHRINE 40 MCG/ML (10ML) SYRINGE FOR IV PUSH (FOR BLOOD PRESSURE SUPPORT)
PREFILLED_SYRINGE | INTRAVENOUS | Status: AC
  Filled 2020-07-16: qty 10

## 2020-07-16 MED ORDER — EPHEDRINE 5 MG/ML INJ
INTRAVENOUS | Status: AC
  Filled 2020-07-16: qty 10

## 2020-07-16 MED ORDER — CEFAZOLIN SODIUM-DEXTROSE 2-3 GM-%(50ML) IV SOLR: INTRAVENOUS | Status: DC | PRN

## 2020-07-16 MED ORDER — FENTANYL CITRATE (PF) 100 MCG/2ML IJ SOLN
INTRAMUSCULAR | Status: DC | PRN
Start: 2020-07-16 — End: 2020-07-16
  Administered 2020-07-16: 50 ug via INTRAVENOUS
  Administered 2020-07-16: 150 ug via INTRAVENOUS

## 2020-07-16 MED ORDER — CEFAZOLIN SODIUM-DEXTROSE 2-4 GM/100ML-% IV SOLN
2.0000 g | Freq: Once | INTRAVENOUS | Status: AC
  Administered 2020-07-16: 2 g via INTRAVENOUS

## 2020-07-16 MED ORDER — LACTATED RINGERS IV SOLN: INTRAVENOUS | Status: DC | PRN

## 2020-07-16 MED ORDER — LIDOCAINE 2% (20 MG/ML) 5 ML SYRINGE
INTRAMUSCULAR | Status: AC
  Filled 2020-07-16: qty 5

## 2020-07-16 MED ORDER — 0.9 % SODIUM CHLORIDE (POUR BTL) OPTIME
TOPICAL | Status: DC | PRN
  Administered 2020-07-16: 1000 mL

## 2020-07-16 MED ORDER — ARTIFICIAL TEARS OPHTHALMIC OINT
TOPICAL_OINTMENT | OPHTHALMIC | Status: AC
  Filled 2020-07-16: qty 3.5

## 2020-07-16 MED ORDER — CHLORHEXIDINE GLUCONATE CLOTH 2 % EX PADS
6.0000 | MEDICATED_PAD | Freq: Every day | CUTANEOUS | Status: AC
  Administered 2020-07-15 – 2020-07-20 (×4): 6 via TOPICAL

## 2020-07-16 MED ORDER — ONDANSETRON HCL 4 MG/2ML IJ SOLN
INTRAMUSCULAR | Status: DC | PRN
  Administered 2020-07-16: 4 mg via INTRAVENOUS

## 2020-07-16 MED ORDER — HYDROMORPHONE HCL 1 MG/ML IJ SOLN
0.5000 mg | INTRAMUSCULAR | Status: DC | PRN
  Administered 2020-07-16: 2 mg via INTRAVENOUS
  Administered 2020-07-16: 1 mg via INTRAVENOUS
  Administered 2020-07-17: 2 mg via INTRAVENOUS
  Administered 2020-07-17 – 2020-07-20 (×8): 1 mg via INTRAVENOUS
  Administered 2020-07-20: 2 mg via INTRAVENOUS
  Administered 2020-07-20 (×2): 1 mg via INTRAVENOUS
  Administered 2020-07-21: 2 mg via INTRAVENOUS
  Filled 2020-07-16 (×2): qty 1
  Filled 2020-07-16: qty 2
  Filled 2020-07-16 (×3): qty 1
  Filled 2020-07-16: qty 2
  Filled 2020-07-16 (×3): qty 1
  Filled 2020-07-16 (×2): qty 2
  Filled 2020-07-16 (×3): qty 1

## 2020-07-16 MED ORDER — OXYCODONE HCL 5 MG PO TABS
5.0000 mg | ORAL_TABLET | ORAL | Status: DC | PRN
  Administered 2020-07-19 (×2): 5 mg via ORAL
  Filled 2020-07-16 (×2): qty 1

## 2020-07-16 SURGICAL SUPPLY — 54 items
BIT DRILL CALIBRTD FREE HND4.3 (BIT) ×1 IMPLANT
BIT DRILL CALIBRTD SHORT 4.9MM (BIT) ×1 IMPLANT
BNDG COHESIVE 6X5 TAN STRL LF (GAUZE/BANDAGES/DRESSINGS) ×2 IMPLANT
BRUSH SCRUB EZ PLAIN DRY (MISCELLANEOUS) ×2 IMPLANT
COVER PERINEAL POST (MISCELLANEOUS) ×2 IMPLANT
COVER SURGICAL LIGHT HANDLE (MISCELLANEOUS) ×4 IMPLANT
DRAPE C-ARMOR (DRAPES) ×2 IMPLANT
DRAPE HALF SHEET 40X57 (DRAPES) IMPLANT
DRAPE ORTHO SPLIT 77X108 STRL (DRAPES) ×4
DRAPE SURG ORHT 6 SPLT 77X108 (DRAPES) ×2 IMPLANT
DRAPE U-SHAPE 47X51 STRL (DRAPES) ×2 IMPLANT
DRILL CALIBRATED FREE HAND 4.3 (BIT) ×2
DRILL CALIBRATED SHORT 4.9MM (BIT) ×2
DRSG MEPILEX BORDER 4X4 (GAUZE/BANDAGES/DRESSINGS) ×2 IMPLANT
DRSG MEPILEX BORDER 4X8 (GAUZE/BANDAGES/DRESSINGS) ×2 IMPLANT
ELECT REM PT RETURN 9FT ADLT (ELECTROSURGICAL) ×2
ELECTRODE REM PT RTRN 9FT ADLT (ELECTROSURGICAL) ×1 IMPLANT
GLOVE BIO SURGEON STRL SZ7.5 (GLOVE) ×4 IMPLANT
GLOVE BIO SURGEON STRL SZ8 (GLOVE) ×4 IMPLANT
GLOVE BIOGEL PI IND STRL 7.5 (GLOVE) ×2 IMPLANT
GLOVE BIOGEL PI IND STRL 8 (GLOVE) ×1 IMPLANT
GLOVE BIOGEL PI INDICATOR 7.5 (GLOVE) ×2
GLOVE BIOGEL PI INDICATOR 8 (GLOVE) ×1
GOWN STRL REUS W/ TWL LRG LVL3 (GOWN DISPOSABLE) ×2 IMPLANT
GOWN STRL REUS W/ TWL XL LVL3 (GOWN DISPOSABLE) ×2 IMPLANT
GOWN STRL REUS W/TWL LRG LVL3 (GOWN DISPOSABLE) ×4
GOWN STRL REUS W/TWL XL LVL3 (GOWN DISPOSABLE) ×4
GUIDEWIRE BALL NOSE 100CM (WIRE) ×2 IMPLANT
KIT BASIN OR (CUSTOM PROCEDURE TRAY) ×2 IMPLANT
MANIFOLD NEPTUNE II (INSTRUMENTS) ×2 IMPLANT
NAIL ANN FEM 11X440 (Nail) ×2 IMPLANT
NS IRRIG 1000ML POUR BTL (IV SOLUTION) ×2 IMPLANT
PACK GENERAL/GYN (CUSTOM PROCEDURE TRAY) ×2 IMPLANT
PAD ARMBOARD 7.5X6 YLW CONV (MISCELLANEOUS) ×4 IMPLANT
PIN GUIDE 3.0 THREADED (PIN) ×6 IMPLANT
SCREW BONE 5.0X57.5MM CORTIC (Nail) ×2 IMPLANT
SCREW CANC FA 6X100 (Screw) ×2 IMPLANT
SCREW CORT FA Z 5X70 (Screw) ×2 IMPLANT
SCREW CORT FT ST 5X4.7 (Screw) ×2 IMPLANT
SCREW NAIL Z 6.0 X 105 CANC FA (Miscellaneous) ×2 IMPLANT
SOL PREP PROV IODINE SCRUB 4OZ (MISCELLANEOUS) ×2 IMPLANT
SOLUTION BETADINE 4OZ (MISCELLANEOUS) ×2 IMPLANT
STAPLER VISISTAT 35W (STAPLE) ×4 IMPLANT
SUT ETHILON 2 0 PSLX (SUTURE) ×4 IMPLANT
SUT ETHILON 3 0 PS 1 (SUTURE) ×2 IMPLANT
SUT VIC AB 0 CT1 27 (SUTURE) ×2
SUT VIC AB 0 CT1 27XBRD ANBCTR (SUTURE) ×1 IMPLANT
SUT VIC AB 1 CT1 27 (SUTURE) ×2
SUT VIC AB 1 CT1 27XBRD ANBCTR (SUTURE) ×1 IMPLANT
SUT VIC AB 2-0 CT1 27 (SUTURE) ×2
SUT VIC AB 2-0 CT1 TAPERPNT 27 (SUTURE) ×1 IMPLANT
TOWEL GREEN STERILE (TOWEL DISPOSABLE) ×2 IMPLANT
TOWEL GREEN STERILE FF (TOWEL DISPOSABLE) ×2 IMPLANT
WATER STERILE IRR 1000ML POUR (IV SOLUTION) ×2 IMPLANT
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
## 2020-07-16 NOTE — Anesthesia Procedure Notes (Signed)
Procedure Name: Intubation Date/Time: 07/16/2020 3:03 PM Performed by: Cleda Daub, CRNA Pre-anesthesia Checklist: Patient identified, Emergency Drugs available, Suction available and Patient being monitored Patient Re-evaluated:Patient Re-evaluated prior to induction Oxygen Delivery Method: Circle system utilized Preoxygenation: Pre-oxygenation with 100% oxygen Induction Type: IV induction Ventilation: Mask ventilation without difficulty Laryngoscope Size: Glidescope and 4 Grade View: Grade I Tube type: Oral Tube size: 7.5 mm Number of attempts: 1 Airway Equipment and Method: Stylet and Oral airway Placement Confirmation: ETT inserted through vocal cords under direct vision,  positive ETCO2 and breath sounds checked- equal and bilateral Secured at: 25 cm Tube secured with: Tape Dental Injury: Teeth and Oropharynx as per pre-operative assessment
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
## 2020-07-16 NOTE — Progress Notes (Signed)
Orthopedic Tech Progress Note Patient Details:  Danny Kerr 03-04-1956 [id number]  Ortho Devices Type of Ortho Device: Arm sling Ortho Device/Splint Location: rue Ortho Device/Splint Interventions: Ordered, Application, Adjustment   Post Interventions Patient Tolerated: Well Instructions Provided: Care of device, Adjustment of device   [name] 07/16/2020, 11:42 PM
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
## 2020-07-16 NOTE — Transfer of Care (Signed)
Immediate Anesthesia Transfer of Care Note  Patient: Danny Kerr  Procedure(s) Performed: INTRAMEDULLARY (IM) NAIL FEMORAL (Left Hip)  Patient Location: PACU  Anesthesia Type:General  Level of Consciousness: drowsy  Airway & Oxygen Therapy: Patient Spontanous Breathing and Patient connected to face mask oxygen  Post-op Assessment: Report given to RN and Post -op Vital signs reviewed and stable  Post vital signs: Reviewed and stable  Last Vitals:  Vitals Value Taken Time  BP 110/76 07/16/20 1800  Temp    Pulse 102 07/16/20 1804  Resp 12 07/16/20 1804  SpO2 97 % 07/16/20 1804  Vitals shown include unvalidated device data.  Last Pain:  Vitals:   07/16/20 1200  TempSrc:   PainSc: 8          Complications: No complications documented.

## 2020-07-16 NOTE — Anesthesia Preprocedure Evaluation (Signed)
Anesthesia Evaluation  Patient identified by MRN, date of birth, ID band Patient awake    Reviewed: Allergy & Precautions, NPO status , Patient's Chart, lab work & pertinent test results  Airway Mallampati: I  TM Distance: >3 FB Neck ROM: Full    Dental   Pulmonary    Pulmonary exam normal        Cardiovascular Normal cardiovascular exam     Neuro/Psych    GI/Hepatic Alcoholism with multiple detox events. On precedex to offset DT's   Endo/Other    Renal/GU      Musculoskeletal   Abdominal   Peds  Hematology   Anesthesia Other Findings   Reproductive/Obstetrics                             Anesthesia Physical Anesthesia Plan  ASA: III  Anesthesia Plan: General   Post-op Pain Management:    Induction: Intravenous  PONV Risk Score and Plan: 2  Airway Management Planned: Oral ETT  Additional Equipment:   Intra-op Plan:   Post-operative Plan: Extubation in OR  Informed Consent: I have reviewed the patients History and Physical, chart, labs and discussed the procedure including the risks, benefits and alternatives for the proposed anesthesia with the patient or authorized representative who has indicated his/her understanding and acceptance.       Plan Discussed with: CRNA and Surgeon  Anesthesia Plan Comments:         Anesthesia Quick Evaluation

## 2020-07-17 ENCOUNTER — Inpatient Hospital Stay (HOSPITAL_COMMUNITY): Payer: No Typology Code available for payment source

## 2020-07-17 LAB — CBC
HCT: 29.7 % — ABNORMAL LOW (ref 39.0–52.0)
Hemoglobin: 9.4 g/dL — ABNORMAL LOW (ref 13.0–17.0)
MCH: 33.3 pg (ref 26.0–34.0)
MCHC: 31.6 g/dL (ref 30.0–36.0)
MCV: 105.3 fL — ABNORMAL HIGH (ref 80.0–100.0)
Platelets: 161 10*3/uL (ref 150–400)
RBC: 2.82 MIL/uL — ABNORMAL LOW (ref 4.22–5.81)
RDW: 15.1 % (ref 11.5–15.5)
WBC: 10.6 10*3/uL — ABNORMAL HIGH (ref 4.0–10.5)
nRBC: 0 % (ref 0.0–0.2)

## 2020-07-17 LAB — COMPREHENSIVE METABOLIC PANEL
ALT: 24 U/L (ref 0–44)
AST: 67 U/L — ABNORMAL HIGH (ref 15–41)
Albumin: 2.7 g/dL — ABNORMAL LOW (ref 3.5–5.0)
Alkaline Phosphatase: 46 U/L (ref 38–126)
Anion gap: 6 (ref 5–15)
BUN: 8 mg/dL (ref 8–23)
CO2: 29 mmol/L (ref 22–32)
Calcium: 7.7 mg/dL — ABNORMAL LOW (ref 8.9–10.3)
Chloride: 105 mmol/L (ref 98–111)
Creatinine, Ser: 0.7 mg/dL (ref 0.61–1.24)
GFR calc Af Amer: >60 mL/min (ref >60)
GFR calc non Af Amer: >60 mL/min (ref >60)
Glucose, Bld: 145 mg/dL — ABNORMAL HIGH (ref 70–99)
Potassium: 4.5 mmol/L (ref 3.5–5.1)
Sodium: 140 mmol/L (ref 135–145)
Total Bilirubin: 0.8 mg/dL (ref 0.3–1.2)
Total Protein: 5 g/dL — ABNORMAL LOW (ref 6.5–8.1)

## 2020-07-17 MED ORDER — ENOXAPARIN SODIUM 40 MG/0.4ML ~~LOC~~ SOLN
40.0000 mg | SUBCUTANEOUS | Status: DC
  Administered 2020-07-17 – 2020-07-19 (×3): 40 mg via SUBCUTANEOUS
  Filled 2020-07-17 (×3): qty 0.4

## 2020-07-17 MED ORDER — DOCUSATE SODIUM 100 MG PO CAPS
100.0000 mg | ORAL_CAPSULE | Freq: Two times a day (BID) | ORAL | Status: DC
  Administered 2020-07-17 – 2020-07-27 (×16): 100 mg via ORAL
  Filled 2020-07-17 (×18): qty 1

## 2020-07-17 MED ORDER — SODIUM CHLORIDE 0.9 % IV BOLUS
1000.0000 mL | Freq: Once | INTRAVENOUS | Status: AC
  Administered 2020-07-17: 1000 mL via INTRAVENOUS
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
## 2020-07-17 NOTE — Progress Notes (Signed)
Neurosurgery  S: underwent IM nail for femur fracture yesterday  BP 129/85   Pulse (!) 121   Temp 99.9 F (37.7 C) (Axillary)   Resp 15   Ht 6\' 3"  (1.905 m)   Wt 100 kg   SpO2 94%   BMI 27.56 kg/m  Drowsy MAEs   T11 body fracture, likely nonoperative - awaiting MRI T/L spine - TLSO brace when patient OOB

## 2020-07-17 NOTE — Anesthesia Postprocedure Evaluation (Signed)
Anesthesia Post Note  Patient: Danny Kerr  Procedure(s) Performed: INTRAMEDULLARY (IM) NAIL FEMORAL (Left Hip)     Patient location during evaluation: PACU Anesthesia Type: General Level of consciousness: awake and alert Pain management: pain level controlled Vital Signs Assessment: post-procedure vital signs reviewed and stable Respiratory status: spontaneous breathing, nonlabored ventilation, respiratory function stable and patient connected to nasal cannula oxygen Cardiovascular status: blood pressure returned to baseline and stable Postop Assessment: no apparent nausea or vomiting Anesthetic complications: no   No complications documented.  Last Vitals:  Vitals:   07/17/20 0600 07/17/20 0700  BP: 109/73 123/79  Pulse: 98 (!) 105  Resp: 12 15  Temp:    SpO2: 94% 96%    Last Pain:  Vitals:   07/17/20 0747  TempSrc:   PainSc: Asleep                 Javani Spratt DAVID

## 2020-07-17 NOTE — Progress Notes (Signed)
Patient ID: Danny Kerr, male   DOB: 1956/03/12, 64 y.o.   MRN: [id number] Subjective: 1 Day Post-Op Procedure(s) (LRB): INTRAMEDULLARY (IM) NAIL FEMORAL (Left)    Patient reports pain as moderate.  Dry mouth this am. No events reported  Objective:   VITALS:   Vitals:   07/17/20 0600 07/17/20 0700  BP: 109/73 123/79  Pulse: 98 (!) 105  Resp: 12 15  Temp:    SpO2: 94% 96%    Neurovascular intact Incision: dressing C/D/I  LABS Recent Labs    07/15/20 1637 07/15/20 1637 07/16/20 0106 07/16/20 2109 07/17/20 0446  HGB 14.1   < > 13.6 10.0* 9.4*  HCT 42.5   < > 39.6 30.7* 29.7*  WBC 6.8  --  15.4*  --  10.6*  PLT 427*  --  329  --  161   < > = values in this interval not displayed.    Recent Labs    07/15/20 1637 07/16/20 0106 07/17/20 0446  NA 145 147* 140  K 4.0 4.0 4.5  BUN <5* 6* 8  CREATININE 0.76 0.63 0.70  GLUCOSE 124* 169* 145*    Recent Labs    07/15/20 1637 07/16/20 0106  INR 1.0 1.2     Assessment/Plan: 1 Day Post-Op Procedure(s) (LRB): INTRAMEDULLARY (IM) NAIL FEMORAL (Left)   Advance diet  Definitive treatment of acetabulum fractures per Ortho trauma this week - further direction per OTS
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
## 2020-07-18 ENCOUNTER — Inpatient Hospital Stay (HOSPITAL_COMMUNITY): Payer: No Typology Code available for payment source

## 2020-07-18 LAB — CBC
HCT: 22.7 % — ABNORMAL LOW (ref 39.0–52.0)
Hemoglobin: 7.5 g/dL — ABNORMAL LOW (ref 13.0–17.0)
MCH: 34.1 pg — ABNORMAL HIGH (ref 26.0–34.0)
MCHC: 33 g/dL (ref 30.0–36.0)
MCV: 103.2 fL — ABNORMAL HIGH (ref 80.0–100.0)
Platelets: 125 10*3/uL — ABNORMAL LOW (ref 150–400)
RBC: 2.2 MIL/uL — ABNORMAL LOW (ref 4.22–5.81)
RDW: 13.7 % (ref 11.5–15.5)
WBC: 8.1 10*3/uL (ref 4.0–10.5)
nRBC: 0 % (ref 0.0–0.2)

## 2020-07-18 LAB — BASIC METABOLIC PANEL
Anion gap: 6 (ref 5–15)
BUN: 6 mg/dL — ABNORMAL LOW (ref 8–23)
CO2: 32 mmol/L (ref 22–32)
Calcium: 8.3 mg/dL — ABNORMAL LOW (ref 8.9–10.3)
Chloride: 104 mmol/L (ref 98–111)
Creatinine, Ser: 0.6 mg/dL — ABNORMAL LOW (ref 0.61–1.24)
GFR calc Af Amer: >60 mL/min (ref >60)
GFR calc non Af Amer: >60 mL/min (ref >60)
Glucose, Bld: 114 mg/dL — ABNORMAL HIGH (ref 70–99)
Potassium: 3.3 mmol/L — ABNORMAL LOW (ref 3.5–5.1)
Sodium: 142 mmol/L (ref 135–145)

## 2020-07-18 LAB — MAGNESIUM: Magnesium: 2.1 mg/dL (ref 1.7–2.4)

## 2020-07-18 LAB — BLOOD PRODUCT ORDER (VERBAL) VERIFICATION

## 2020-07-18 LAB — VITAMIN D 25 HYDROXY (VIT D DEFICIENCY, FRACTURES): Vit D, 25-Hydroxy: 28.34 ng/mL — ABNORMAL LOW (ref 30–100)

## 2020-07-18 MED ORDER — IBUPROFEN 200 MG PO TABS
400.0000 mg | ORAL_TABLET | Freq: Three times a day (TID) | ORAL | Status: DC | PRN
  Administered 2020-07-19 – 2020-07-25 (×2): 400 mg via ORAL
  Filled 2020-07-18 (×2): qty 2

## 2020-07-18 MED ORDER — GUAIFENESIN 200 MG PO TABS
200.0000 mg | ORAL_TABLET | Freq: Four times a day (QID) | ORAL | Status: DC
  Administered 2020-07-18 – 2020-07-29 (×40): 200 mg via ORAL
  Filled 2020-07-18 (×46): qty 1

## 2020-07-18 MED ORDER — IPRATROPIUM-ALBUTEROL 0.5-2.5 (3) MG/3ML IN SOLN
3.0000 mL | Freq: Four times a day (QID) | RESPIRATORY_TRACT | Status: AC
  Administered 2020-07-18 – 2020-07-19 (×4): 3 mL via RESPIRATORY_TRACT
  Filled 2020-07-18 (×4): qty 3

## 2020-07-18 MED ORDER — SODIUM CHLORIDE 0.9 % IV SOLN: INTRAVENOUS | Status: DC

## 2020-07-18 MED ORDER — TAMSULOSIN HCL 0.4 MG PO CAPS
0.4000 mg | ORAL_CAPSULE | Freq: Every day | ORAL | Status: DC
  Administered 2020-07-18 – 2020-07-29 (×11): 0.4 mg via ORAL
  Filled 2020-07-18 (×12): qty 1

## 2020-07-18 NOTE — Progress Notes (Signed)
Modified Barium Swallow Progress Note  Patient Details  Name: AMADU SCHLAGETER MRN: [id number] Date of Birth: 05-08-1956  Today's Date: 07/18/2020  Modified Barium Swallow completed.  Full report located under Chart Review in the Imaging Section.  Brief recommendations include the following:  Clinical Impression  During MBS, pt was cooperative but distractable and confused. He demonstrated a moderate oropharyngeal dysphagia as characterized by anterior bolus loss, premature spillage, pharyngeal residue, multiple swallows, and several instances of penetration and aspiration across POs. His oral cavity was observed to contain excessive dried secretions. His oral phase of swallowing is characterized by delayed oral transit and premature spillage to the level of the valleculae. Pharyngeal phase includes significant pharyngeal residue, and multiple swallows. Given nectar thick liquid, pt demonstrated a PAS 2 as material entered the airway above the vocal folds and was subsequently ejected out. Given thin liquid, pt demonstrated a PAS 8 as material entered the airway below the vocal folds without attempt to eject it. With puree, pt demonstrated a PAS 3 as material entered the airway above the folds with no attempt to be ejected. He was instructed to clear his throat, and he produced a weak, ineffective cough. Pharyngeal residue was observed most significantly at the level of the valleculae, and also at the pyriforms. Significant vallecular residue was observed after the solid and was followed by nectar in order to successfully clear. Recommend that pt continue on dys 1 diet (puree) with nectar thick liquids. Pt should alternate sips and bites in order to address pharyngeal residue. Suspect improvement in swallow function as pt's dried oral cavity, generalized weakness, and overall mentation improve.    Swallow Evaluation Recommendations       SLP Diet Recommendations: Dysphagia 1 (Puree) solids;Nectar  thick liquid   Liquid Administration via: Straw   Medication Administration: Crushed with puree   Supervision: Full assist for feeding   Compensations: Follow solids with liquid   Postural Changes: Seated upright at 90 degrees   Oral Care Recommendations: Oral care BID        Greggory Keen 07/18/2020,11:21 AM
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
## 2020-07-19 ENCOUNTER — Encounter (HOSPITAL_COMMUNITY): Payer: Self-pay | Admitting: Certified Registered"

## 2020-07-19 ENCOUNTER — Inpatient Hospital Stay (HOSPITAL_COMMUNITY): Payer: No Typology Code available for payment source

## 2020-07-19 ENCOUNTER — Encounter (HOSPITAL_COMMUNITY): Admission: EM | Disposition: A | Discharge status: Skilled Nursing Facility | Payer: Self-pay | Source: Home / Self Care

## 2020-07-19 ENCOUNTER — Encounter (HOSPITAL_COMMUNITY): Payer: Self-pay | Admitting: Orthopedic Surgery

## 2020-07-19 LAB — CBC
HCT: 22.9 % — ABNORMAL LOW (ref 39.0–52.0)
Hemoglobin: 7.7 g/dL — ABNORMAL LOW (ref 13.0–17.0)
MCH: 34.1 pg — ABNORMAL HIGH (ref 26.0–34.0)
MCHC: 33.6 g/dL (ref 30.0–36.0)
MCV: 101.3 fL — ABNORMAL HIGH (ref 80.0–100.0)
Platelets: 136 10*3/uL — ABNORMAL LOW (ref 150–400)
RBC: 2.26 MIL/uL — ABNORMAL LOW (ref 4.22–5.81)
RDW: 13.4 % (ref 11.5–15.5)
WBC: 7.3 10*3/uL (ref 4.0–10.5)
nRBC: 0 % (ref 0.0–0.2)

## 2020-07-19 LAB — BASIC METABOLIC PANEL
Anion gap: 7 (ref 5–15)
BUN: 9 mg/dL (ref 8–23)
CO2: 28 mmol/L (ref 22–32)
Calcium: 8.4 mg/dL — ABNORMAL LOW (ref 8.9–10.3)
Chloride: 108 mmol/L (ref 98–111)
Creatinine, Ser: 0.53 mg/dL — ABNORMAL LOW (ref 0.61–1.24)
GFR calc Af Amer: >60 mL/min (ref >60)
GFR calc non Af Amer: >60 mL/min (ref >60)
Glucose, Bld: 124 mg/dL — ABNORMAL HIGH (ref 70–99)
Potassium: 3.3 mmol/L — ABNORMAL LOW (ref 3.5–5.1)
Sodium: 143 mmol/L (ref 135–145)

## 2020-07-19 SURGERY — MRI WITH ANESTHESIA
Anesthesia: General

## 2020-07-19 MED ORDER — SODIUM CHLORIDE 0.9 % IV SOLN
2.0000 g | Freq: Three times a day (TID) | INTRAVENOUS | Status: DC
  Administered 2020-07-19 – 2020-07-20 (×4): 2 g via INTRAVENOUS
  Filled 2020-07-19 (×4): qty 2

## 2020-07-19 MED ORDER — POTASSIUM CHLORIDE CRYS ER 20 MEQ PO TBCR
40.0000 meq | EXTENDED_RELEASE_TABLET | Freq: Once | ORAL | Status: AC
  Administered 2020-07-19: 40 meq via ORAL
  Filled 2020-07-19: qty 2
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
## 2020-07-19 NOTE — Progress Notes (Signed)
Wife updated at bedside

## 2020-07-19 NOTE — Progress Notes (Signed)
To MRI with RN, Sat and HR monitored continuously.

## 2020-07-19 NOTE — Progress Notes (Signed)
Phlebotomy called to draw AM labs. Spoke to [employer]

## 2020-07-19 NOTE — TOC CAGE-AID Note (Signed)
Transition of Care [facility]) - CAGE-AID Screening   Patient Details  Name: Danny Kerr MRN: [id number] Date of Birth: 1956/04/04  Transition of Care [facility]) CM/SW Contact:    Emeterio Reeve, [place] Phone Number: 07/19/2020, 1:41 PM   Clinical Narrative:  Pt is unable to participate in assessment due to only being oriented to person.   CAGE-AID Screening: Substance Abuse Screening unable to be completed due to: : Patient unable to participate          [place] [place] Clinical Social Worker [phone]

## 2020-07-19 NOTE — Progress Notes (Signed)
MRI ordered several days ago. Called to set up time today.

## 2020-07-20 LAB — BASIC METABOLIC PANEL
Anion gap: 7 (ref 5–15)
BUN: 11 mg/dL (ref 8–23)
CO2: 29 mmol/L (ref 22–32)
Calcium: 8.4 mg/dL — ABNORMAL LOW (ref 8.9–10.3)
Chloride: 110 mmol/L (ref 98–111)
Creatinine, Ser: 0.58 mg/dL — ABNORMAL LOW (ref 0.61–1.24)
GFR calc Af Amer: >60 mL/min (ref >60)
GFR calc non Af Amer: >60 mL/min (ref >60)
Glucose, Bld: 118 mg/dL — ABNORMAL HIGH (ref 70–99)
Potassium: 3.3 mmol/L — ABNORMAL LOW (ref 3.5–5.1)
Sodium: 146 mmol/L — ABNORMAL HIGH (ref 135–145)

## 2020-07-20 LAB — CBC
HCT: 15.5 % — ABNORMAL LOW (ref 39.0–52.0)
Hemoglobin: 5 g/dL — CL (ref 13.0–17.0)
MCH: 33.6 pg (ref 26.0–34.0)
MCHC: 32.3 g/dL (ref 30.0–36.0)
MCV: 104 fL — ABNORMAL HIGH (ref 80.0–100.0)
Platelets: 147 10*3/uL — ABNORMAL LOW (ref 150–400)
RBC: 1.49 MIL/uL — ABNORMAL LOW (ref 4.22–5.81)
RDW: 14.2 % (ref 11.5–15.5)
WBC: 9.2 10*3/uL (ref 4.0–10.5)
nRBC: 0 % (ref 0.0–0.2)

## 2020-07-20 LAB — HEMOGLOBIN AND HEMATOCRIT, BLOOD
HCT: 29.5 % — ABNORMAL LOW (ref 39.0–52.0)
Hemoglobin: 9.6 g/dL — ABNORMAL LOW (ref 13.0–17.0)

## 2020-07-20 LAB — PREPARE RBC (CROSSMATCH)

## 2020-07-20 MED ORDER — OXYCODONE HCL 5 MG PO TABS
5.0000 mg | ORAL_TABLET | ORAL | Status: DC | PRN
  Administered 2020-07-21: 5 mg via ORAL
  Administered 2020-07-22 – 2020-07-24 (×4): 10 mg via ORAL
  Administered 2020-07-24: 5 mg via ORAL
  Administered 2020-07-24: 10 mg via ORAL
  Administered 2020-07-24 (×2): 5 mg via ORAL
  Administered 2020-07-25 (×3): 10 mg via ORAL
  Administered 2020-07-26: 5 mg via ORAL
  Administered 2020-07-26: 10 mg via ORAL
  Administered 2020-07-28: 5 mg via ORAL
  Administered 2020-07-29: 10 mg via ORAL
  Filled 2020-07-20 (×2): qty 1
  Filled 2020-07-20 (×2): qty 2
  Filled 2020-07-20: qty 1
  Filled 2020-07-20 (×2): qty 2
  Filled 2020-07-20: qty 1
  Filled 2020-07-20: qty 2
  Filled 2020-07-20 (×2): qty 1
  Filled 2020-07-20 (×2): qty 2
  Filled 2020-07-20: qty 1
  Filled 2020-07-20 (×3): qty 2

## 2020-07-20 MED ORDER — POTASSIUM CHLORIDE CRYS ER 20 MEQ PO TBCR
40.0000 meq | EXTENDED_RELEASE_TABLET | ORAL | Status: DC
  Filled 2020-07-20: qty 2

## 2020-07-20 MED ORDER — SODIUM CHLORIDE 0.9% IV SOLUTION: Freq: Once | INTRAVENOUS | Status: DC

## 2020-07-20 MED ORDER — CHLORHEXIDINE GLUCONATE CLOTH 2 % EX PADS
6.0000 | MEDICATED_PAD | Freq: Every day | CUTANEOUS | Status: DC
  Administered 2020-07-22 – 2020-07-28 (×6): 6 via TOPICAL

## 2020-07-20 MED ORDER — CEFAZOLIN SODIUM-DEXTROSE 2-4 GM/100ML-% IV SOLN
2.0000 g | Freq: Three times a day (TID) | INTRAVENOUS | Status: AC
  Administered 2020-07-20 – 2020-07-26 (×19): 2 g via INTRAVENOUS
  Filled 2020-07-20 (×19): qty 100

## 2020-07-20 MED ORDER — POTASSIUM CHLORIDE 20 MEQ PO PACK
40.0000 meq | PACK | ORAL | Status: AC
  Administered 2020-07-20 (×2): 40 meq via ORAL
  Filled 2020-07-20 (×2): qty 2
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
## 2020-07-20 NOTE — Progress Notes (Signed)
Patient ID: Danny Kerr, male   DOB: 06-13-1956, 64 y.o.   MRN: [id number] I called his wife and updated her. [name] lives alone and it does not seem like he will have a lot of support at D/C so SNF rehab may be the best plan. We will see how he progresses.  Georganna Skeans, MD, MPH, FACS Please use [email or web address] to contact on call provider

## 2020-07-20 NOTE — Progress Notes (Signed)
Orthopedic Tech Progress Note Patient Details:  Danny Kerr [birth year] [id number] Called in order to [facility] for a TLSO Patient ID: Danny Kerr, male   DOB: 01/21/1956, 64 y.o.   MRN: [id number]   Janit Pagan 07/20/2020, 9:26 AM
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
## 2020-07-20 NOTE — Progress Notes (Signed)
Orthopedic Tech Progress Note Patient Details:  Danny Kerr [birth year] [id number]  Ortho Devices Type of Ortho Device: Abduction pillow Ortho Device/Splint Location: BLE Ortho Device/Splint Interventions: Ordered, Application, Adjustment   Post Interventions Patient Tolerated: Well Instructions Provided: Other (comment)   [name] 07/20/2020, 7:04 PM
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
## 2020-07-20 NOTE — Progress Notes (Signed)
OT Cancellation Note  Patient Details Name: Danny Kerr MRN: [id number] DOB: Feb 14, 1956   Cancelled Treatment:    Reason Eval/Treat Not Completed: Medical issues which prohibited therapy; pt with low hgb this AM (5.0) to receive blood transfusion. Will follow up for OT eval as able.  Danny Kerr, OT Acute Rehabilitation Services Pager [phone] Office [phone]   Raymondo Band 07/20/2020, 11:21 AM

## 2020-07-21 ENCOUNTER — Inpatient Hospital Stay (HOSPITAL_COMMUNITY): Payer: No Typology Code available for payment source

## 2020-07-21 DIAGNOSIS — J9602 Acute respiratory failure with hypercapnia: Secondary | ICD-10-CM

## 2020-07-21 DIAGNOSIS — I4891 Unspecified atrial fibrillation: Secondary | ICD-10-CM

## 2020-07-21 DIAGNOSIS — R7989 Other specified abnormal findings of blood chemistry: Secondary | ICD-10-CM | POA: Diagnosis not present

## 2020-07-21 DIAGNOSIS — J90 Pleural effusion, not elsewhere classified: Secondary | ICD-10-CM | POA: Diagnosis not present

## 2020-07-21 LAB — CULTURE, RESPIRATORY W GRAM STAIN

## 2020-07-21 LAB — TSH: TSH: 0.782 u[IU]/mL (ref 0.350–4.500)

## 2020-07-21 LAB — TYPE AND SCREEN
ABO/RH(D): O NEG
Antibody Screen: NEGATIVE
Unit division: 0
Unit division: 0

## 2020-07-21 LAB — BASIC METABOLIC PANEL
Anion gap: 9 (ref 5–15)
BUN: 12 mg/dL (ref 8–23)
CO2: 33 mmol/L — ABNORMAL HIGH (ref 22–32)
Calcium: 8.9 mg/dL (ref 8.9–10.3)
Chloride: 105 mmol/L (ref 98–111)
Creatinine, Ser: 0.86 mg/dL (ref 0.61–1.24)
GFR calc Af Amer: >60 mL/min (ref >60)
GFR calc non Af Amer: >60 mL/min (ref >60)
Glucose, Bld: 143 mg/dL — ABNORMAL HIGH (ref 70–99)
Potassium: 4.3 mmol/L (ref 3.5–5.1)
Sodium: 147 mmol/L — ABNORMAL HIGH (ref 135–145)

## 2020-07-21 LAB — POCT I-STAT 7, (LYTES, BLD GAS, ICA,H+H)
Acid-Base Excess: 9 mmol/L — ABNORMAL HIGH (ref 0.0–2.0)
Bicarbonate: 33.6 mmol/L — ABNORMAL HIGH (ref 20.0–28.0)
Calcium, Ion: 1.2 mmol/L (ref 1.15–1.40)
HCT: 30 % — ABNORMAL LOW (ref 39.0–52.0)
Hemoglobin: 10.2 g/dL — ABNORMAL LOW (ref 13.0–17.0)
O2 Saturation: 99 %
Patient temperature: 99.4
Potassium: 3.3 mmol/L — ABNORMAL LOW (ref 3.5–5.1)
Sodium: 146 mmol/L — ABNORMAL HIGH (ref 135–145)
TCO2: 35 mmol/L — ABNORMAL HIGH (ref 22–32)
pCO2 arterial: 46.2 mmHg (ref 32.0–48.0)
pH, Arterial: 7.471 — ABNORMAL HIGH (ref 7.350–7.450)
pO2, Arterial: 152 mmHg — ABNORMAL HIGH (ref 83.0–108.0)

## 2020-07-21 LAB — GLUCOSE, CAPILLARY: Glucose-Capillary: 129 mg/dL — ABNORMAL HIGH (ref 70–99)

## 2020-07-21 LAB — URINALYSIS, ROUTINE W REFLEX MICROSCOPIC
Bilirubin Urine: NEGATIVE
Glucose, UA: NEGATIVE mg/dL
Hgb urine dipstick: NEGATIVE
Ketones, ur: NEGATIVE mg/dL
Leukocytes,Ua: NEGATIVE
Nitrite: NEGATIVE
Protein, ur: NEGATIVE mg/dL
Specific Gravity, Urine: 1.019 (ref 1.005–1.030)
pH: 5 (ref 5.0–8.0)

## 2020-07-21 LAB — BPAM RBC
Blood Product Expiration Date: 202110082359
Blood Product Expiration Date: 202110162359
ISSUE DATE / TIME: 202109291239
ISSUE DATE / TIME: 202109291439
Unit Type and Rh: 9500
Unit Type and Rh: 9500

## 2020-07-21 LAB — BLOOD GAS, ARTERIAL
Acid-Base Excess: 3.4 mmol/L — ABNORMAL HIGH (ref 0.0–2.0)
Bicarbonate: 30 mmol/L — ABNORMAL HIGH (ref 20.0–28.0)
Drawn by: 244901
FIO2: 80
O2 Saturation: 96.4 %
Patient temperature: 37.4
pCO2 arterial: 70.6 mmHg (ref 32.0–48.0)
pH, Arterial: 7.254 — ABNORMAL LOW (ref 7.350–7.450)
pO2, Arterial: 89.6 mmHg (ref 83.0–108.0)

## 2020-07-21 LAB — BRAIN NATRIURETIC PEPTIDE: B Natriuretic Peptide: 223.1 pg/mL — ABNORMAL HIGH (ref 0.0–100.0)

## 2020-07-21 LAB — AMMONIA: Ammonia: 38 umol/L — ABNORMAL HIGH (ref 9–35)

## 2020-07-21 LAB — CBC
HCT: 33 % — ABNORMAL LOW (ref 39.0–52.0)
Hemoglobin: 10.6 g/dL — ABNORMAL LOW (ref 13.0–17.0)
MCH: 32.5 pg (ref 26.0–34.0)
MCHC: 32.1 g/dL (ref 30.0–36.0)
MCV: 101.2 fL — ABNORMAL HIGH (ref 80.0–100.0)
Platelets: 152 10*3/uL (ref 150–400)
RBC: 3.26 MIL/uL — ABNORMAL LOW (ref 4.22–5.81)
RDW: 16.9 % — ABNORMAL HIGH (ref 11.5–15.5)
WBC: 7.8 10*3/uL (ref 4.0–10.5)
nRBC: 0 % (ref 0.0–0.2)

## 2020-07-21 LAB — VITAMIN B12: Vitamin B-12: 401 pg/mL (ref 180–914)

## 2020-07-21 LAB — MAGNESIUM: Magnesium: 2.1 mg/dL (ref 1.7–2.4)

## 2020-07-21 LAB — TROPONIN I (HIGH SENSITIVITY)
Troponin I (High Sensitivity): 193 ng/L (ref <18)
Troponin I (High Sensitivity): 139 ng/L (ref <18)

## 2020-07-21 MED ORDER — LORAZEPAM 1 MG PO TABS
1.0000 mg | ORAL_TABLET | ORAL | Status: AC | PRN
  Administered 2020-07-24: 2 mg via ORAL
  Administered 2020-07-24: 1 mg via ORAL
  Filled 2020-07-21: qty 2
  Filled 2020-07-21: qty 1

## 2020-07-21 MED ORDER — IOHEXOL 350 MG/ML SOLN
100.0000 mL | Freq: Once | INTRAVENOUS | Status: AC | PRN
  Administered 2020-07-21: 100 mL via INTRAVENOUS

## 2020-07-21 MED ORDER — LORAZEPAM 2 MG/ML IJ SOLN
1.0000 mg | INTRAMUSCULAR | Status: AC | PRN
  Administered 2020-07-21 – 2020-07-22 (×4): 2 mg via INTRAVENOUS
  Administered 2020-07-22: 4 mg via INTRAVENOUS
  Administered 2020-07-22: 2 mg via INTRAVENOUS
  Administered 2020-07-23: 1 mg via INTRAVENOUS
  Administered 2020-07-23 – 2020-07-24 (×5): 2 mg via INTRAVENOUS
  Filled 2020-07-21 (×10): qty 1
  Filled 2020-07-21: qty 2
  Filled 2020-07-21 (×2): qty 1

## 2020-07-21 MED ORDER — METOPROLOL TARTRATE 5 MG/5ML IV SOLN
INTRAVENOUS | Status: AC
  Administered 2020-07-21: 5 mg via INTRAMUSCULAR
  Administered 2020-07-21: 5 mg via INTRAVENOUS
  Filled 2020-07-21: qty 5

## 2020-07-21 MED ORDER — NALOXONE HCL 0.4 MG/ML IJ SOLN
INTRAMUSCULAR | Status: AC
  Filled 2020-07-21: qty 2

## 2020-07-21 MED ORDER — DILTIAZEM HCL-DEXTROSE 125-5 MG/125ML-% IV SOLN (PREMIX)
5.0000 mg/h | INTRAVENOUS | Status: DC
  Administered 2020-07-21: 5 mg/h via INTRAVENOUS
  Filled 2020-07-21: qty 125

## 2020-07-21 MED ORDER — FUROSEMIDE 10 MG/ML IJ SOLN
40.0000 mg | Freq: Once | INTRAMUSCULAR | Status: AC
  Administered 2020-07-21: 40 mg via INTRAVENOUS
  Filled 2020-07-21: qty 4

## 2020-07-21 MED ORDER — METOPROLOL TARTRATE 5 MG/5ML IV SOLN
5.0000 mg | Freq: Once | INTRAVENOUS | Status: AC
  Filled 2020-07-21: qty 5

## 2020-07-21 MED ORDER — EPINEPHRINE PF 1 MG/ML IJ SOLN
INTRAMUSCULAR | Status: AC
  Filled 2020-07-21: qty 1

## 2020-07-21 NOTE — Progress Notes (Signed)
Dr Zenia Resides on the 4NP into see patient while he was in Sinus Tach rate 136. Dr Zenia Resides ordered EKG and labs and said Trauma would follow up on.

## 2020-07-21 NOTE — Progress Notes (Signed)
This nurse was notified by [facility] Tele[facility] that pt had hr in the 180s. This nurse and charge nurse went to the pt room where pt was found to be laying in bed not responding to voice or pain. PA came to the room and rapid response was called to assess pt.

## 2020-07-21 NOTE — Progress Notes (Signed)
PT Cancellation Note  Patient Details Name: Danny Kerr MRN: [id number] DOB: Sep 05, 1956   Cancelled Treatment:    Reason Eval/Treat Not Completed: Patient's level of consciousness. Pt is unable to be arouse by RN, rapid response team and trauma team present at bedside. PT will follow up when the pt is more alert and better able to participate in PT intervention.   Zenaida Niece 07/21/2020, 9:27 AM

## 2020-07-21 NOTE — Progress Notes (Signed)
RT NOTE: RT transported patient on bipap from room 4NP12 to CT and then to room 1P50 with no complications. Vitals are stable. RT will continue to monitor.

## 2020-07-21 NOTE — Progress Notes (Signed)
Patient ID: Danny Kerr, male   DOB: 06-19-1956, 64 y.o.   MRN: [id number] ABG a lot better. Try [place] O2. I spoke with his wife at the bedside.  Georganna Skeans, MD, MPH, FACS Please use [email or web address] to contact on call provider
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
## 2020-07-21 NOTE — Progress Notes (Signed)
Patient ID: Danny Kerr, male   DOB: 25-Dec-1955, 64 y.o.   MRN: [id number] See other note. AF RVR, and decreased mental status. Only slight improvement after narcan. CXR B PNA. ABD shows PaCO2 70 so will place on BiPAP. Transfer to ICU. F/U ABG. ALso check NH4, CTA chest. Consult Cardiology.  Georganna Skeans, MD, MPH, FACS Please use [email or web address] to contact on call provider

## 2020-07-21 NOTE — Progress Notes (Signed)
Pt transferred to [place]. Pt calm, HR 118 AF, BP 117/76, sat 100 % on Bipap.

## 2020-07-21 NOTE — Progress Notes (Signed)
OT Cancellation Note  Patient Details Name: Danny Kerr MRN: [id number] DOB: [birth year]   Cancelled Treatment:    Reason Eval/Treat Not Completed: Medical issues which prohibited therapy (Pt currently with rapid response due inability to arouse). OT will continue to follow acutely  Jaci Carrel 07/21/2020, 8:55 AM   Jesse Sans OTR/L Acute Rehabilitation Services Pager: [phone] Office: [phone]

## 2020-07-21 NOTE — Significant Event (Signed)
Rapid Response Event Note   Reason for Call :  Decreased LOC, tachycardia  Initial Focused Assessment:  Pt lying in bed. Skin is warm, moist. Pt intermittently following simple commands. Lung sounds are clear, diminished. Pt snoring intermittently. No accessory muscle use. Pt pupils 19mm. Following Narcan administration pupils are 40mm, reactive. Left pupil sluggish, right pupil brisk. Pt opens eyes to voice and stimuli, eye contact is not sustained. Pt last received 2mg  IV Ativan at 0500 and 2mg  IV Dilaudid at 0600. Heart rate irregular.   VS: T 99.21F, 95/58, HR 133, RR 11, SpO2 99% on 3LNC CBG: 129  Interventions:  -Narcan 0.4mg  x2 -ABG: 7.25/ 70.6/ 89.6/ 30 -CXR, CTA Chest -Labs: Mg, Trop, Ammonia, CBC, BMP  Plan of Care:  -BiPAP 16/6, 50% -Transfer to ICU  Event Summary:  MD Notified: Dr. Grandville Silos Call Time: 8325 Arrival Time: 4982 End Time:  [place], RN
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
## 2020-07-22 ENCOUNTER — Inpatient Hospital Stay (HOSPITAL_COMMUNITY): Payer: No Typology Code available for payment source

## 2020-07-22 DIAGNOSIS — I4891 Unspecified atrial fibrillation: Secondary | ICD-10-CM

## 2020-07-22 DIAGNOSIS — R7989 Other specified abnormal findings of blood chemistry: Secondary | ICD-10-CM | POA: Diagnosis not present

## 2020-07-22 DIAGNOSIS — I361 Nonrheumatic tricuspid (valve) insufficiency: Secondary | ICD-10-CM

## 2020-07-22 DIAGNOSIS — J9602 Acute respiratory failure with hypercapnia: Secondary | ICD-10-CM | POA: Diagnosis not present

## 2020-07-22 DIAGNOSIS — J90 Pleural effusion, not elsewhere classified: Secondary | ICD-10-CM | POA: Diagnosis not present

## 2020-07-22 LAB — BASIC METABOLIC PANEL WITH GFR
Anion gap: 12 (ref 5–15)
BUN: 15 mg/dL (ref 8–23)
CO2: 28 mmol/L (ref 22–32)
Calcium: 8.7 mg/dL — ABNORMAL LOW (ref 8.9–10.3)
Chloride: 106 mmol/L (ref 98–111)
Creatinine, Ser: 0.57 mg/dL — ABNORMAL LOW (ref 0.61–1.24)
GFR calc Af Amer: >60 mL/min
GFR calc non Af Amer: >60 mL/min
Glucose, Bld: 118 mg/dL — ABNORMAL HIGH (ref 70–99)
Potassium: 3.3 mmol/L — ABNORMAL LOW (ref 3.5–5.1)
Sodium: 146 mmol/L — ABNORMAL HIGH (ref 135–145)

## 2020-07-22 LAB — ECHOCARDIOGRAM COMPLETE
Area-P 1/2: 3.93 cm2
Height: 75 in
S' Lateral: 3.2 cm
Weight: 3527.36 oz

## 2020-07-22 LAB — CBC
HCT: 31.3 % — ABNORMAL LOW (ref 39.0–52.0)
Hemoglobin: 10.5 g/dL — ABNORMAL LOW (ref 13.0–17.0)
MCH: 32.6 pg (ref 26.0–34.0)
MCHC: 33.5 g/dL (ref 30.0–36.0)
MCV: 97.2 fL (ref 80.0–100.0)
Platelets: 198 10*3/uL (ref 150–400)
RBC: 3.22 MIL/uL — ABNORMAL LOW (ref 4.22–5.81)
RDW: 15.7 % — ABNORMAL HIGH (ref 11.5–15.5)
WBC: 7.3 10*3/uL (ref 4.0–10.5)
nRBC: 0 % (ref 0.0–0.2)

## 2020-07-22 LAB — GLUCOSE, CAPILLARY
Glucose-Capillary: 126 mg/dL — ABNORMAL HIGH (ref 70–99)
Glucose-Capillary: 135 mg/dL — ABNORMAL HIGH (ref 70–99)
Glucose-Capillary: 123 mg/dL — ABNORMAL HIGH (ref 70–99)

## 2020-07-22 LAB — PHOSPHORUS
Phosphorus: 3.2 mg/dL (ref 2.5–4.6)
Phosphorus: 2.4 mg/dL — ABNORMAL LOW (ref 2.5–4.6)

## 2020-07-22 LAB — AMMONIA: Ammonia: 49 umol/L — ABNORMAL HIGH (ref 9–35)

## 2020-07-22 LAB — MAGNESIUM
Magnesium: 2 mg/dL (ref 1.7–2.4)
Magnesium: 2 mg/dL (ref 1.7–2.4)

## 2020-07-22 MED ORDER — DILTIAZEM HCL 30 MG PO TABS
30.0000 mg | ORAL_TABLET | Freq: Four times a day (QID) | ORAL | Status: DC
  Administered 2020-07-22 – 2020-07-25 (×12): 30 mg via ORAL
  Filled 2020-07-22 (×13): qty 1

## 2020-07-22 MED ORDER — VITAL 1.5 CAL PO LIQD
1000.0000 mL | ORAL | Status: DC
  Administered 2020-07-22 – 2020-07-25 (×4): 1000 mL
  Filled 2020-07-22 (×4): qty 1000

## 2020-07-22 MED ORDER — PROSOURCE TF PO LIQD
45.0000 mL | Freq: Three times a day (TID) | ORAL | Status: DC
  Administered 2020-07-22 – 2020-07-26 (×12): 45 mL
  Filled 2020-07-22 (×12): qty 45

## 2020-07-22 MED ORDER — POTASSIUM CHLORIDE 20 MEQ/15ML (10%) PO SOLN
40.0000 meq | ORAL | Status: AC
  Administered 2020-07-22 (×2): 40 meq via ORAL
  Filled 2020-07-22 (×2): qty 30

## 2020-07-22 MED ORDER — ACETAMINOPHEN 500 MG PO TABS
1000.0000 mg | ORAL_TABLET | Freq: Four times a day (QID) | ORAL | Status: DC
  Administered 2020-07-22 – 2020-07-29 (×26): 1000 mg via ORAL
  Filled 2020-07-22 (×26): qty 2

## 2020-07-22 MED ORDER — ENSURE ENLIVE PO LIQD
237.0000 mL | Freq: Two times a day (BID) | ORAL | Status: DC
  Administered 2020-07-22 – 2020-07-28 (×5): 237 mL via ORAL

## 2020-07-22 MED ORDER — METHOCARBAMOL 1000 MG/10ML IJ SOLN
1000.0000 mg | Freq: Three times a day (TID) | INTRAVENOUS | Status: DC
  Administered 2020-07-22 – 2020-07-26 (×13): 1000 mg via INTRAVENOUS
  Filled 2020-07-22 (×21): qty 10

## 2020-07-22 MED ORDER — NOREPINEPHRINE 4 MG/250ML-% IV SOLN
INTRAVENOUS | Status: AC
  Administered 2020-07-22: 2 ug/min via INTRAVENOUS
  Filled 2020-07-22: qty 250

## 2020-07-22 MED ORDER — NOREPINEPHRINE 4 MG/250ML-% IV SOLN: 0.0000 ug/min | INTRAVENOUS | Status: DC

## 2020-07-22 MED ORDER — POTASSIUM CHLORIDE CRYS ER 20 MEQ PO TBCR
40.0000 meq | EXTENDED_RELEASE_TABLET | Freq: Every day | ORAL | Status: DC
  Administered 2020-07-23 – 2020-07-29 (×7): 40 meq via ORAL
  Filled 2020-07-22 (×7): qty 2

## 2020-07-22 MED ORDER — LACTATED RINGERS IV BOLUS
500.0000 mL | Freq: Once | INTRAVENOUS | Status: AC
  Administered 2020-07-22: 500 mL via INTRAVENOUS

## 2020-07-22 MED ORDER — FUROSEMIDE 10 MG/ML IJ SOLN
INTRAMUSCULAR | Status: AC
  Filled 2020-07-22: qty 4

## 2020-07-22 MED ORDER — ENOXAPARIN SODIUM 30 MG/0.3ML ~~LOC~~ SOLN
30.0000 mg | Freq: Two times a day (BID) | SUBCUTANEOUS | Status: DC
  Administered 2020-07-22 – 2020-07-29 (×15): 30 mg via SUBCUTANEOUS
  Filled 2020-07-22 (×15): qty 0.3

## 2020-07-22 MED ORDER — FUROSEMIDE 10 MG/ML IJ SOLN
40.0000 mg | Freq: Once | INTRAMUSCULAR | Status: AC
  Administered 2020-07-22: 40 mg via INTRAVENOUS
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
## 2020-07-22 NOTE — Procedures (Signed)
Cortrak  Person Inserting Tube:  Armonee Bojanowski E, RD Tube Type:  Cortrak - 43 inches Tube Location:  Left nare Initial Placement:  Stomach Secured by: Bridle Technique Used to Measure Tube Placement:  Documented cm marking at nare/ corner of mouth Cortrak Secured At:  70 cm   Cortrak Tube Team Note:  Consult received to place a Cortrak feeding tube.   No x-ray is required. RN may begin using tube.   If the tube becomes dislodged please keep the tube and contact the Cortrak team at [email or web address] ([email or web address]) for replacement.  If after hours and replacement cannot be delayed, place a NG tube and confirm placement with an abdominal x-ray.    Zelda Reames, MS, RD, LDN RD pager number and weekend/on-call pager number located in Amion.    
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
## 2020-07-22 NOTE — Progress Notes (Signed)
  Echocardiogram 2D Echocardiogram has been performed.  Jennette Dubin 07/22/2020, 11:16 AM
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
## 2020-07-22 NOTE — Progress Notes (Signed)
BIPAP is SB. RT will check on pt again to see if he would like to go on the machine.
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
## 2020-07-23 DIAGNOSIS — I480 Paroxysmal atrial fibrillation: Secondary | ICD-10-CM

## 2020-07-23 LAB — BASIC METABOLIC PANEL
Anion gap: 9 (ref 5–15)
BUN: 14 mg/dL (ref 8–23)
CO2: 28 mmol/L (ref 22–32)
Calcium: 8.6 mg/dL — ABNORMAL LOW (ref 8.9–10.3)
Chloride: 110 mmol/L (ref 98–111)
Creatinine, Ser: 0.51 mg/dL — ABNORMAL LOW (ref 0.61–1.24)
GFR calc Af Amer: >60 mL/min (ref >60)
GFR calc non Af Amer: >60 mL/min (ref >60)
Glucose, Bld: 135 mg/dL — ABNORMAL HIGH (ref 70–99)
Potassium: 3 mmol/L — ABNORMAL LOW (ref 3.5–5.1)
Sodium: 147 mmol/L — ABNORMAL HIGH (ref 135–145)

## 2020-07-23 LAB — GLUCOSE, CAPILLARY
Glucose-Capillary: 128 mg/dL — ABNORMAL HIGH (ref 70–99)
Glucose-Capillary: 121 mg/dL — ABNORMAL HIGH (ref 70–99)
Glucose-Capillary: 107 mg/dL — ABNORMAL HIGH (ref 70–99)
Glucose-Capillary: 132 mg/dL — ABNORMAL HIGH (ref 70–99)
Glucose-Capillary: 118 mg/dL — ABNORMAL HIGH (ref 70–99)
Glucose-Capillary: 140 mg/dL — ABNORMAL HIGH (ref 70–99)

## 2020-07-23 LAB — MAGNESIUM
Magnesium: 1.9 mg/dL (ref 1.7–2.4)
Magnesium: 1.8 mg/dL (ref 1.7–2.4)

## 2020-07-23 LAB — PHOSPHORUS
Phosphorus: 2.3 mg/dL — ABNORMAL LOW (ref 2.5–4.6)
Phosphorus: 4.3 mg/dL (ref 2.5–4.6)

## 2020-07-23 MED ORDER — POTASSIUM PHOSPHATES 15 MMOLE/5ML IV SOLN
30.0000 mmol | Freq: Once | INTRAVENOUS | Status: AC
  Administered 2020-07-23: 30 mmol via INTRAVENOUS
  Filled 2020-07-23: qty 10

## 2020-07-23 NOTE — Progress Notes (Signed)
BIPAP SB. Pt is resting well.

## 2020-07-23 NOTE — Plan of Care (Signed)
°  Problem: Coping: °Goal: Level of anxiety will decrease °Outcome: Progressing °  °
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
## 2020-07-24 LAB — GLUCOSE, CAPILLARY
Glucose-Capillary: 105 mg/dL — ABNORMAL HIGH (ref 70–99)
Glucose-Capillary: 117 mg/dL — ABNORMAL HIGH (ref 70–99)
Glucose-Capillary: 124 mg/dL — ABNORMAL HIGH (ref 70–99)
Glucose-Capillary: 126 mg/dL — ABNORMAL HIGH (ref 70–99)
Glucose-Capillary: 129 mg/dL — ABNORMAL HIGH (ref 70–99)
Glucose-Capillary: 133 mg/dL — ABNORMAL HIGH (ref 70–99)

## 2020-07-24 LAB — URINE CULTURE: Culture: NO GROWTH

## 2020-07-24 MED ORDER — LORAZEPAM 1 MG PO TABS
1.0000 mg | ORAL_TABLET | ORAL | Status: AC | PRN
  Administered 2020-07-27: 4 mg via ORAL
  Administered 2020-07-27: 1 mg via ORAL
  Filled 2020-07-24: qty 1
  Filled 2020-07-24: qty 4

## 2020-07-24 MED ORDER — RESOURCE THICKENUP CLEAR PO POWD
ORAL | Status: DC | PRN
  Filled 2020-07-24 (×2): qty 125

## 2020-07-24 MED ORDER — LORAZEPAM 2 MG/ML IJ SOLN
1.0000 mg | INTRAMUSCULAR | Status: AC | PRN
  Administered 2020-07-24 (×2): 2 mg via INTRAVENOUS
  Administered 2020-07-26: 3 mg via INTRAVENOUS
  Administered 2020-07-26: 2 mg via INTRAVENOUS
  Administered 2020-07-27: 4 mg via INTRAVENOUS
  Filled 2020-07-24 (×2): qty 1
  Filled 2020-07-24 (×2): qty 2
  Filled 2020-07-24: qty 1

## 2020-07-24 NOTE — Brief Op Note (Signed)
#  [id number]  

## 2020-07-24 NOTE — Plan of Care (Signed)
  Problem: Elimination: Goal: Will not experience complications related to bowel motility Outcome: Progressing   
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
## 2020-07-25 DIAGNOSIS — I480 Paroxysmal atrial fibrillation: Secondary | ICD-10-CM | POA: Diagnosis not present

## 2020-07-25 LAB — BASIC METABOLIC PANEL
Anion gap: 6 (ref 5–15)
BUN: 14 mg/dL (ref 8–23)
CO2: 31 mmol/L (ref 22–32)
Calcium: 8.7 mg/dL — ABNORMAL LOW (ref 8.9–10.3)
Chloride: 106 mmol/L (ref 98–111)
Creatinine, Ser: 0.46 mg/dL — ABNORMAL LOW (ref 0.61–1.24)
GFR calc Af Amer: >60 mL/min (ref >60)
GFR calc non Af Amer: >60 mL/min (ref >60)
Glucose, Bld: 124 mg/dL — ABNORMAL HIGH (ref 70–99)
Potassium: 4 mmol/L (ref 3.5–5.1)
Sodium: 143 mmol/L (ref 135–145)

## 2020-07-25 LAB — GLUCOSE, CAPILLARY
Glucose-Capillary: 138 mg/dL — ABNORMAL HIGH (ref 70–99)
Glucose-Capillary: 115 mg/dL — ABNORMAL HIGH (ref 70–99)
Glucose-Capillary: 140 mg/dL — ABNORMAL HIGH (ref 70–99)
Glucose-Capillary: 105 mg/dL — ABNORMAL HIGH (ref 70–99)
Glucose-Capillary: 119 mg/dL — ABNORMAL HIGH (ref 70–99)

## 2020-07-25 MED ORDER — POLYETHYLENE GLYCOL 3350 17 G PO PACK
17.0000 g | PACK | Freq: Every day | ORAL | Status: DC
  Administered 2020-07-25 – 2020-07-27 (×2): 17 g via ORAL
  Filled 2020-07-25: qty 1

## 2020-07-25 MED ORDER — DILTIAZEM HCL ER COATED BEADS 120 MG PO CP24
120.0000 mg | ORAL_CAPSULE | Freq: Every day | ORAL | Status: DC
  Administered 2020-07-25 – 2020-07-29 (×5): 120 mg via ORAL
  Filled 2020-07-25 (×5): qty 1
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
## 2020-07-25 NOTE — Progress Notes (Signed)
Pt in no distress at this time requiring bipap.  No machine at bed side.  RT will monitor for need.
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
## 2020-07-26 LAB — BASIC METABOLIC PANEL
Anion gap: 9 (ref 5–15)
BUN: 14 mg/dL (ref 8–23)
CO2: 31 mmol/L (ref 22–32)
Calcium: 8.8 mg/dL — ABNORMAL LOW (ref 8.9–10.3)
Chloride: 103 mmol/L (ref 98–111)
Creatinine, Ser: 0.47 mg/dL — ABNORMAL LOW (ref 0.61–1.24)
GFR calc Af Amer: >60 mL/min (ref >60)
GFR calc non Af Amer: >60 mL/min (ref >60)
Glucose, Bld: 134 mg/dL — ABNORMAL HIGH (ref 70–99)
Potassium: 4 mmol/L (ref 3.5–5.1)
Sodium: 143 mmol/L (ref 135–145)

## 2020-07-26 LAB — GLUCOSE, CAPILLARY
Glucose-Capillary: 103 mg/dL — ABNORMAL HIGH (ref 70–99)
Glucose-Capillary: 103 mg/dL — ABNORMAL HIGH (ref 70–99)
Glucose-Capillary: 109 mg/dL — ABNORMAL HIGH (ref 70–99)
Glucose-Capillary: 115 mg/dL — ABNORMAL HIGH (ref 70–99)
Glucose-Capillary: 116 mg/dL — ABNORMAL HIGH (ref 70–99)
Glucose-Capillary: 121 mg/dL — ABNORMAL HIGH (ref 70–99)
Glucose-Capillary: 121 mg/dL — ABNORMAL HIGH (ref 70–99)

## 2020-07-26 MED ORDER — HALOPERIDOL LACTATE 5 MG/ML IJ SOLN
10.0000 mg | Freq: Four times a day (QID) | INTRAMUSCULAR | Status: DC | PRN
  Administered 2020-07-26 – 2020-07-29 (×3): 10 mg via INTRAVENOUS
  Filled 2020-07-26 (×4): qty 2
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
## 2020-07-27 ENCOUNTER — Inpatient Hospital Stay (HOSPITAL_COMMUNITY): Payer: No Typology Code available for payment source

## 2020-07-27 LAB — BASIC METABOLIC PANEL
Anion gap: 7 (ref 5–15)
BUN: 11 mg/dL (ref 8–23)
CO2: 32 mmol/L (ref 22–32)
Calcium: 8.7 mg/dL — ABNORMAL LOW (ref 8.9–10.3)
Chloride: 101 mmol/L (ref 98–111)
Creatinine, Ser: 0.44 mg/dL — ABNORMAL LOW (ref 0.61–1.24)
GFR calc non Af Amer: >60 mL/min (ref >60)
Glucose, Bld: 103 mg/dL — ABNORMAL HIGH (ref 70–99)
Potassium: 3.7 mmol/L (ref 3.5–5.1)
Sodium: 140 mmol/L (ref 135–145)

## 2020-07-27 LAB — SARS CORONAVIRUS 2 BY RT PCR (HOSPITAL ORDER, PERFORMED IN ~~LOC~~ HOSPITAL LAB): SARS Coronavirus 2: NEGATIVE

## 2020-07-27 LAB — GLUCOSE, CAPILLARY
Glucose-Capillary: 107 mg/dL — ABNORMAL HIGH (ref 70–99)
Glucose-Capillary: 84 mg/dL (ref 70–99)
Glucose-Capillary: 109 mg/dL — ABNORMAL HIGH (ref 70–99)
Glucose-Capillary: 103 mg/dL — ABNORMAL HIGH (ref 70–99)

## 2020-07-27 MED ORDER — SORBITOL 70 % SOLN
960.0000 mL | TOPICAL_OIL | Freq: Once | ORAL | Status: AC
  Administered 2020-07-27: 960 mL via RECTAL
  Filled 2020-07-27: qty 473

## 2020-07-27 MED ORDER — METHOCARBAMOL 500 MG PO TABS
1000.0000 mg | ORAL_TABLET | Freq: Three times a day (TID) | ORAL | Status: DC
  Administered 2020-07-27 – 2020-07-29 (×6): 1000 mg via ORAL
  Filled 2020-07-27 (×7): qty 2
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
## 2020-07-27 NOTE — Plan of Care (Signed)
  Problem: Health Behavior/Discharge Planning: °Goal: Ability to manage health-related needs will improve °Outcome: Progressing °  °Problem: Clinical Measurements: °Goal: Ability to maintain clinical measurements within normal limits will improve °Outcome: Progressing °  °Problem: Clinical Measurements: °Goal: Diagnostic test results will improve °Outcome: Progressing °  °Problem: Clinical Measurements: °Goal: Respiratory complications will improve °Outcome: Progressing °  °
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
## 2020-07-27 NOTE — Progress Notes (Signed)
Orthopedic Tech Progress Note Patient Details:  Danny Kerr August 10, 1956 [id number] Patient soiled HIP ABDUCTION PILLOW. And RN called requesting a new one Ortho Devices Type of Ortho Device: Abduction pillow Ortho Device/Splint Location: hips Ortho Device/Splint Interventions: Application, Adjustment   Post Interventions Patient Tolerated: Well Instructions Provided: Care of [place] 07/27/2020, 5:44 PM

## 2020-07-27 NOTE — TOC Progression Note (Signed)
Transition of Care [facility]) - Progression Note    Patient Details  Name: KAIVEN VESTER MRN: [id number] Date of Birth: 08-Jan-1956  [facility]) CM/SW Contact  Oren Section Cleta Alberts, RN Phone Number: 07/27/2020, 2:17 PM  Clinical Narrative:   Spoke with Pearla Dubonnet, patient's spouse, to discuss bed offers.  She states she is not happy with current bed offers, and would like him faxed out to a larger area, as he has family in the [place] area.  Agreed to send his information out to [facility] area, and provide results to her as they are available.  Will follow up with patient's wife in a.m. with additional bed offers.    Expected Discharge Plan: [place] Barriers to Discharge: Continued Medical Work up  Expected Discharge Plan and Services Expected Discharge Plan: [place]   Discharge Planning Services: CM Consult Post Acute Care Choice: [place] Living arrangements for the past 2 months: Single Family Home                                       Social Determinants of Health (SDOH) Interventions    Readmission Risk Interventions No flowsheet data found.  Reinaldo Raddle, RN, BSN  Trauma/Neuro ICU Case Manager [phone]
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
## 2020-07-27 NOTE — Progress Notes (Signed)
Per order this nurse removed cortrak with no difficulty. Pt tolerated well. IV was also removed from [place]. Mits also removed at this time. Katherina Right RN

## 2020-07-27 NOTE — Care Management Important Message (Signed)
Important Message  Patient Details  Name: Danny Kerr MRN: [id number] Date of Birth: 1956/06/03   Medicare Important Message Given:  Yes     Orbie Pyo 07/27/2020, 2:43 PM
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
## 2020-07-28 ENCOUNTER — Inpatient Hospital Stay (HOSPITAL_COMMUNITY): Payer: No Typology Code available for payment source

## 2020-07-28 LAB — BASIC METABOLIC PANEL
Anion gap: 8 (ref 5–15)
BUN: 8 mg/dL (ref 8–23)
CO2: 29 mmol/L (ref 22–32)
Calcium: 8.5 mg/dL — ABNORMAL LOW (ref 8.9–10.3)
Chloride: 101 mmol/L (ref 98–111)
Creatinine, Ser: 0.47 mg/dL — ABNORMAL LOW (ref 0.61–1.24)
GFR calc non Af Amer: >60 mL/min (ref >60)
Glucose, Bld: 104 mg/dL — ABNORMAL HIGH (ref 70–99)
Potassium: 3.5 mmol/L (ref 3.5–5.1)
Sodium: 138 mmol/L (ref 135–145)

## 2020-07-28 MED ORDER — ENSURE ENLIVE PO LIQD: 237.0000 mL | Freq: Three times a day (TID) | ORAL | Status: DC
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
## 2020-07-28 NOTE — Discharge Instructions (Addendum)
Orthopaedic Discharge instructions   Injuries     R proximal humerus fracture treated non-operatively     Left femur fracture treated with intramedullary nailing     Bilateral acetabulum fractures treated non-operatively   Instructions      Ok to leave surgical sites L thigh open to air. Clean with soap and water only             Sutures can be removed from L thigh 08/03/2020     Nonweightbearing left leg      Weightbearing as tolerated right leg for transfers only at this time      Posterior hip precautions bilaterally      Unrestricted motion of B knees and ankle              Recommend PROM and AROM sessions at least daily and preferably 2x a day
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
## 2020-07-28 NOTE — TOC Progression Note (Signed)
Transition of Care [facility]) - Progression Note    Patient Details  Name: KALYB PEMBLE MRN: [id number] Date of Birth: May 15, 1956  [facility]) CM/SW Contact  Oren Section Cleta Alberts, RN Phone Number: 07/28/2020, 5:10 PM  Clinical Narrative:   Patient's wife has chosen a bed at [facility] care in [place].  Spoke with Harriet Pho in admissions at facility; she states that currently they do not have a bed available.  Patient medically stable for discharge on Friday, October 8.  Admissions liaison states that she will have a bed meeting at 9 AM on Friday morning to determine if she will have discharges available on Friday, and thus a bed available for patient.  Notified patient's wife of issues with bed availability at her chosen facility.  She is aware that if bed not available, she may have to choose another facility for discharge.  She verbalizes understanding of this.  Will follow up with [facility] in a.m. to determine if bed available.  Patient will not need insurance authorization, as he has Medicare.    Expected Discharge Plan: Skilled Nursing Facility Barriers to Discharge: SNF Pending discharge orders  Expected Discharge Plan and Services Expected Discharge Plan: [place]   Discharge Planning Services: CM Consult Post Acute Care Choice: [place] Living arrangements for the past 2 months: Single Family Home                                       Social Determinants of Health (SDOH) Interventions    Readmission Risk Interventions No flowsheet data found.  Reinaldo Raddle, RN, BSN  Trauma/Neuro ICU Case Manager [phone]
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
## 2020-07-28 NOTE — Progress Notes (Signed)
Called Danny Kerr, patient's wife, at her request for an update.  We discussed his current situation and status.  We discussed that he has been faxed out by our [facility] to other places at her request and we are still waiting to hear from them.  She raised concern about some facial droop on one side of his face last night when she visited.  He did not have that this morning on my exam or noted by nursing staff overnight.  His neuro status, albeit, very confused, has not otherwise changed since I have been seeing him.  However, she did request a follow up head CT to his one on admission.  I have d/w Dr. Bobbye Morton and given her request, this will be ordered.  I did discuss with the wife my low suspicion for any new findings and that we do not typically order scans "just to see" given it is still radiation.  However, as stated earlier given what she saw last night, we will proceed.    [name] 12:25 PM 07/28/2020

## 2020-07-29 MED ORDER — GUAIFENESIN 200 MG PO TABS: 200.0000 mg | ORAL_TABLET | Freq: Four times a day (QID) | ORAL | 0 refills | Status: DC | PRN

## 2020-07-29 MED ORDER — POLYETHYLENE GLYCOL 3350 17 G PO PACK: 17.0000 g | PACK | Freq: Every day | ORAL | 0 refills | Status: DC

## 2020-07-29 MED ORDER — METHOCARBAMOL 500 MG PO TABS: 1000.0000 mg | ORAL_TABLET | Freq: Three times a day (TID) | ORAL | Status: DC | PRN

## 2020-07-29 MED ORDER — IBUPROFEN 400 MG PO TABS: 400.0000 mg | ORAL_TABLET | Freq: Three times a day (TID) | ORAL | 0 refills | Status: DC | PRN

## 2020-07-29 MED ORDER — BACITRACIN ZINC 500 UNIT/GM EX OINT: TOPICAL_OINTMENT | Freq: Two times a day (BID) | CUTANEOUS | 0 refills | Status: DC

## 2020-07-29 MED ORDER — TAMSULOSIN HCL 0.4 MG PO CAPS
0.4000 mg | ORAL_CAPSULE | Freq: Every day | ORAL | Status: DC
Start: 2020-07-29 — End: 2020-10-11

## 2020-07-29 MED ORDER — DILTIAZEM HCL ER COATED BEADS 120 MG PO CP24: 120.0000 mg | ORAL_CAPSULE | Freq: Every day | ORAL | Status: DC

## 2020-07-29 MED ORDER — OXYCODONE HCL 5 MG PO TABS
5.0000 mg | ORAL_TABLET | ORAL | 0 refills | Status: DC | PRN
Start: 2020-07-29 — End: 2020-10-11

## 2020-07-29 MED ORDER — ACETAMINOPHEN 500 MG PO TABS
1000.0000 mg | ORAL_TABLET | Freq: Four times a day (QID) | ORAL | 0 refills | Status: DC | PRN
Start: 2020-07-29 — End: 2021-07-28

## 2020-07-29 NOTE — Progress Notes (Signed)
Second attempt to give report made to [facility] with no success. Piv removed pt tolerated well. PTAR here to transport

## 2020-07-29 NOTE — TOC Transition Note (Signed)
Transition of Care [facility]) - CM/SW Discharge Note   Patient Details  Name: AJ CRUNKLETON MRN: [id number] Date of Birth: Feb 21, 1956  Transition [facility] Care [facility]) CM/SW Contact:  Ella Bodo, RN Phone Number: 07/29/2020, 3:55 PM   Clinical Narrative: [place] unable to offer a bed today for patient, as has no available beds, per admissions liaison.  Wife disappointed, but understands that patient is medically stable for discharge today.  She does asked that I check with two other facilities that have not responded to the original bed request.  Phoned [facility], per her request; both facilities state they will call me back if able to accommodate patient.  Addendum 3:00 PM [place] able to admit patient today; notified wife, and she is pleased with this bed offer.  She is able to go to facility today and sign paperwork before 4 PM, as requested by facility.  Discharge summary and transfer report sent to facility per protocol.  Attending MD bedside nurse aware of discharge; patient discharging to room 3250.  Bedside nurse to call report to [phone].  PTAR called for transport at 4:05 PM.    Final next level of care: [place] Barriers to Discharge: Barriers Resolved   Patient Goals and CMS Choice   CMS Medicare.gov Compare Post Acute Care list provided to:: Patient Represenative (must comment) (Spouse [name]) Choice offered to / list presented to : Spouse  Discharge Placement PASRR number recieved: 07/23/20            Patient chooses bed at: [facility] Patient to be transferred to facility by: [place] Name of family member notified: Demerius Podolak Patient and family notified of of transfer: 07/29/20  Discharge Plan and Services   Discharge Planning Services: CM Consult Post Acute Care Choice: [place]                               Social  Determinants of Health (SDOH) Interventions     Readmission Risk Interventions No flowsheet data found.  Reinaldo Raddle, RN, BSN  Trauma/Neuro ICU Case Manager [phone]
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
## 2020-07-29 NOTE — Progress Notes (Signed)
Attempted to call report to [facility] nurse. Left a voice mail. Katherina Right RN

## 2020-08-01 ENCOUNTER — Encounter (HOSPITAL_COMMUNITY): Payer: Self-pay

## 2020-08-01 ENCOUNTER — Telehealth: Payer: Self-pay

## 2020-08-01 NOTE — Telephone Encounter (Signed)
  Patient was recently discharged from the hospital on 07/29/20.  No TCM completed, patient does not qualify for TCM services due to being d/c to a SNF.   Per discharge summary patient needs follow up with PCP. No HFU scheduled at this time- FYI.

## 2020-08-19 ENCOUNTER — Ambulatory Visit: Payer: Medicare Other | Admitting: Cardiovascular Disease
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
## 2020-09-23 ENCOUNTER — Other Ambulatory Visit: Payer: Self-pay

## 2020-09-23 ENCOUNTER — Ambulatory Visit (INDEPENDENT_AMBULATORY_CARE_PROVIDER_SITE_OTHER): Payer: Medicare Other | Admitting: Cardiovascular Disease

## 2020-09-23 ENCOUNTER — Encounter: Payer: Self-pay | Admitting: Cardiovascular Disease

## 2020-09-23 VITALS — BP 102/70 | HR 104 | Ht 72.0 in | Wt 166.0 lb

## 2020-09-23 DIAGNOSIS — I4510 Unspecified right bundle-branch block: Secondary | ICD-10-CM

## 2020-09-23 DIAGNOSIS — I444 Left anterior fascicular block: Secondary | ICD-10-CM

## 2020-09-23 DIAGNOSIS — I4819 Other persistent atrial fibrillation: Secondary | ICD-10-CM

## 2020-09-23 NOTE — Patient Instructions (Signed)
Medication Instructions:  Your physician has recommended you make the following change in your medication:  1. Discontinue Diltiazem   *If you need a refill on your cardiac medications before your next appointment, please call your pharmacy*   Lab Work: -None If you have labs (blood work) drawn today and your tests are completely normal, you will receive your results only by: Marland Kitchen MyChart Message (if you have MyChart) OR . A paper copy in the mail If you have any lab test that is abnormal or we need to change your treatment, we will call you to review the results.   Testing/Procedures: -None   Follow-Up: At [facility], you and your health needs are our priority.  As part of our continuing mission to provide you with exceptional heart care, we have created designated Provider Care Teams.  These Care Teams include your primary Cardiologist (physician) and Advanced Practice Providers (APPs -  Physician Assistants and Nurse Practitioners) who all work together to provide you with the care you need, when you need it.  We recommend signing up for the patient portal called "MyChart".  Sign up information is provided on this After Visit Summary.  MyChart is used to connect with patients for Virtual Visits (Telemedicine).  Patients are able to view lab/test results, encounter notes, upcoming appointments, etc.  Non-urgent messages can be sent to your provider as well.   To learn more about what you can do with MyChart, go to [email or web address].    Your next appointment:   1 year(s)  The format for your next appointment:   In Person  Provider:   Eleonore Chiquito, MD   Other Instructions Your physician wants you to follow-up in: 1 year with Dr. Audie Box.  You will receive a reminder letter in the mail two months in advance. If you don't receive a letter, please call our office to schedule the follow-up appointment.

## 2020-09-28 ENCOUNTER — Ambulatory Visit: Payer: Medicare Other | Admitting: Cardiovascular Disease

## 2020-10-11 ENCOUNTER — Ambulatory Visit (INDEPENDENT_AMBULATORY_CARE_PROVIDER_SITE_OTHER): Payer: Medicare Other | Admitting: Family Medicine

## 2020-10-11 ENCOUNTER — Other Ambulatory Visit: Payer: Self-pay

## 2020-10-11 ENCOUNTER — Encounter: Payer: Self-pay | Admitting: Family Medicine

## 2020-10-11 VITALS — BP 104/69 | HR 82 | Temp 97.8°F | Ht 72.0 in | Wt 171.2 lb

## 2020-10-11 DIAGNOSIS — D696 Thrombocytopenia, unspecified: Secondary | ICD-10-CM | POA: Diagnosis not present

## 2020-10-11 DIAGNOSIS — F419 Anxiety disorder, unspecified: Secondary | ICD-10-CM | POA: Diagnosis not present

## 2020-10-11 DIAGNOSIS — F1010 Alcohol abuse, uncomplicated: Secondary | ICD-10-CM

## 2020-10-11 NOTE — Assessment & Plan Note (Signed)
No longer on Zoloft.  Mood is overall stable.  He has been following with a therapist which seems to be doing well.

## 2020-10-11 NOTE — Patient Instructions (Signed)
It is okayIt was very nice to see you today!  For you to stop your Flomax and other medications.  You should continue taking multivitamin and protein shakes.  I will see back in a few months and we can check blood work at that time.  Please come back to see me sooner if needed.  Take care, Dr Jerline Pain  Please try these tips to maintain a healthy lifestyle:   Eat at least 3 REAL meals and 1-2 snacks per day.  Aim for no more than 5 hours between eating.  If you eat breakfast, please do so within one hour of getting up.    Each meal should contain half fruits/vegetables, one quarter protein, and one quarter carbs (no bigger than a computer mouse)   Cut down on sweet beverages. This includes juice, soda, and sweet tea.     Drink at least 1 glass of water with each meal and aim for at least 8 glasses per day   Exercise at least 150 minutes every week.
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
## 2020-10-11 NOTE — Assessment & Plan Note (Addendum)
Patient has been abstinent for the past 90 days.  Encourage continued cessation.  He will continue seeing his therapits.

## 2020-10-17 ENCOUNTER — Telehealth: Payer: Self-pay

## 2020-10-17 NOTE — Telephone Encounter (Signed)
Ok with me. Please place any necessary orders. 

## 2020-10-17 NOTE — Telephone Encounter (Signed)
Does the patient need to be seen in office or can we place orders for the patient to go to [facility] and have xrays done? Please advise

## 2020-10-17 NOTE — Telephone Encounter (Signed)
Patient called in and stated when he was in rehab they had him do xrays and it showed he had pneumonia. Patient wants to see if Dr. Jimmey Kerr will put in orders for him to get xrays here to see if it is still showing up. Please advise.

## 2020-10-18 ENCOUNTER — Other Ambulatory Visit: Payer: Self-pay | Admitting: *Deleted

## 2020-10-18 DIAGNOSIS — J189 Pneumonia, unspecified organism: Secondary | ICD-10-CM

## 2020-10-18 NOTE — Telephone Encounter (Signed)
Xray placed  Patient notified

## 2020-10-25 ENCOUNTER — Other Ambulatory Visit: Payer: Self-pay

## 2020-10-25 ENCOUNTER — Ambulatory Visit (INDEPENDENT_AMBULATORY_CARE_PROVIDER_SITE_OTHER)
Admission: RE | Admit: 2020-10-25 | Discharge: 2020-10-25 | Disposition: A | Discharge status: Home / Self Care | Payer: Medicare Other | Source: Ambulatory Visit | Attending: Family Medicine | Admitting: Family Medicine

## 2020-10-25 DIAGNOSIS — J189 Pneumonia, unspecified organism: Secondary | ICD-10-CM

## 2020-10-26 ENCOUNTER — Ambulatory Visit: Payer: Medicare Other | Attending: Orthopedic Surgery | Admitting: Physical Therapy

## 2020-10-26 ENCOUNTER — Encounter: Payer: Self-pay | Admitting: Physical Therapy

## 2020-10-26 ENCOUNTER — Telehealth: Payer: Self-pay

## 2020-10-26 ENCOUNTER — Other Ambulatory Visit: Payer: Self-pay

## 2020-10-26 DIAGNOSIS — M25552 Pain in left hip: Secondary | ICD-10-CM | POA: Diagnosis present

## 2020-10-26 DIAGNOSIS — R262 Difficulty in walking, not elsewhere classified: Secondary | ICD-10-CM | POA: Diagnosis present

## 2020-10-26 DIAGNOSIS — M25611 Stiffness of right shoulder, not elsewhere classified: Secondary | ICD-10-CM | POA: Diagnosis present

## 2020-10-26 DIAGNOSIS — R2689 Other abnormalities of gait and mobility: Secondary | ICD-10-CM | POA: Insufficient documentation

## 2020-10-26 DIAGNOSIS — M6281 Muscle weakness (generalized): Secondary | ICD-10-CM | POA: Diagnosis present

## 2020-10-26 NOTE — Telephone Encounter (Signed)
Please advise 

## 2020-10-26 NOTE — Telephone Encounter (Signed)
error 
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
## 2020-10-26 NOTE — Telephone Encounter (Signed)
Patient is calling in asking if the pneumonia antibiotics could be sent to the rehab facility he is currently at. [facility] fax:[phone] attn; christian feilds

## 2020-10-27 NOTE — Telephone Encounter (Signed)
LVM for patient to schedule.

## 2020-10-27 NOTE — Telephone Encounter (Signed)
If there are any visits left even virtual lets get patient set up- full history will need to be reviewed on this with patient and treatment strategy made- may or may not include antibiotics

## 2020-10-27 NOTE — Telephone Encounter (Signed)
None today. But dr.kim has virtual appts open tomorrow

## 2020-10-27 NOTE — Telephone Encounter (Signed)
Please schedule the patient with Dr. Selena Batten . Thank you

## 2020-10-27 NOTE — Telephone Encounter (Signed)
Pt called office again about antibiotics for pneumonia. Please advise.

## 2020-10-27 NOTE — Telephone Encounter (Signed)
Please advise Pt has appointment with Dr Jimmey Ralph on 10/31/2020
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
## 2020-10-31 ENCOUNTER — Encounter: Payer: Self-pay | Admitting: Family Medicine

## 2020-10-31 ENCOUNTER — Ambulatory Visit: Payer: Medicare Other | Admitting: Physical Therapy

## 2020-10-31 ENCOUNTER — Other Ambulatory Visit: Payer: Self-pay

## 2020-10-31 ENCOUNTER — Ambulatory Visit (INDEPENDENT_AMBULATORY_CARE_PROVIDER_SITE_OTHER): Payer: Medicare Other | Admitting: Family Medicine

## 2020-10-31 VITALS — BP 128/81 | HR 79 | Temp 97.6°F | Ht 72.0 in | Wt 174.0 lb

## 2020-10-31 DIAGNOSIS — J90 Pleural effusion, not elsewhere classified: Secondary | ICD-10-CM

## 2020-10-31 DIAGNOSIS — M25552 Pain in left hip: Secondary | ICD-10-CM | POA: Diagnosis not present

## 2020-10-31 DIAGNOSIS — R2689 Other abnormalities of gait and mobility: Secondary | ICD-10-CM

## 2020-10-31 DIAGNOSIS — M25611 Stiffness of right shoulder, not elsewhere classified: Secondary | ICD-10-CM

## 2020-10-31 DIAGNOSIS — M6281 Muscle weakness (generalized): Secondary | ICD-10-CM
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
## 2020-11-02 ENCOUNTER — Other Ambulatory Visit: Payer: Self-pay

## 2020-11-02 ENCOUNTER — Ambulatory Visit: Payer: Medicare Other

## 2020-11-02 DIAGNOSIS — M6281 Muscle weakness (generalized): Secondary | ICD-10-CM

## 2020-11-02 DIAGNOSIS — R2689 Other abnormalities of gait and mobility: Secondary | ICD-10-CM

## 2020-11-02 DIAGNOSIS — M25552 Pain in left hip: Secondary | ICD-10-CM

## 2020-11-02 DIAGNOSIS — M25611 Stiffness of right shoulder, not elsewhere classified: Secondary | ICD-10-CM

## 2020-11-02 NOTE — Therapy (Signed)
[place], [place], [address] Phone: [phone]   Fax:  [phone]  Physical Therapy Treatment  Patient Details  Name: Danny Kerr MRN: [id number] Date of Birth: 1956/07/27 Referring Provider (PT): Altamese [place] MD   Encounter Date: 11/02/2020   PT End of Session - 11/02/20 1526    Visit Number 3    Number of Visits 17    Date for PT Re-Evaluation 12/21/20    Authorization Type MCR - KX mod at 15th visit    Progress Note Due on Visit 10    PT Start Time 1530    PT Stop Time 1615    PT Time Calculation (min) 45 min    Activity Tolerance Patient tolerated treatment well    Behavior During Therapy [facility] for tasks assessed/performed           Past Medical History:  Diagnosis Date  . Arrhythmia   . Arthritis   . Displaced segmental fracture of shaft of left femur, initial encounter for closed fracture ([place]) 07/16/2020  . ETOH abuse   . Pneumonia   . Rosacea   . Seasonal allergies     Past Surgical History:  Procedure Laterality Date  . COLONOSCOPY    . FEMUR IM NAIL Left 07/16/2020   Procedure: INTRAMEDULLARY (IM) NAIL FEMORAL;  Surgeon: Altamese [place], MD;  Location: [place];  Service: Orthopedics;  Laterality: Left;  . KNEE ARTHROTOMY  1977   rt knee  . RADIOLOGY WITH ANESTHESIA N/A 09/03/2017   Procedure: MIR CERVICAL Burt Ek;  Surgeon: Radiologist, Medication, MD;  Location: [place];  Service: Radiology;  Laterality: N/A;  . TOTAL KNEE ARTHROPLASTY Right 09/06/2014   Procedure: RIGHT TOTAL KNEE ARTHROPLASTY;  Surgeon: Gearlean Alf, MD;  [facility];  Service: Orthopedics;  Laterality: Right;    There were no vitals filed for this visit.   Subjective Assessment - 11/02/20 1727    Subjective Pt reports havig some soreness with the initiation of PT and completion of his HEP.    Patient Stated Goals increase strenth, reduce atrophy, maintain healthy weight    Currently in  Pain? Yes    Pain Score 1     Pain Location Leg    Pain Orientation Left    Pain Descriptors / Indicators Aching    Pain Onset More than a month ago    Pain Frequency Intermittent    Aggravating Factors  over doing exercise              [facility] PT Assessment - 11/02/20 0001      AROM   Right Shoulder Flexion 110 Degrees   PROM 150                        [facility] Adult PT Treatment/Exercise - 11/02/20 0001      Ambulation/Gait   Ambulation/Gait Yes    Gait Pattern Step-through pattern;Decreased stride length;Trendelenburg;Antalgic      Exercises   Exercises Shoulder;Knee/Hip      Knee/Hip Exercises: Stretches   Active Hamstring Stretch Right;Left;3 reps;20 seconds    Active Hamstring Stretch Limitations Seated c hip flexion not past 90d      Knee/Hip Exercises: Standing   Hip Flexion Stengthening;Right;1 set;15 reps    Hip Flexion Limitations red Tband    Hip Abduction Stengthening;Both;15 reps;1 set    Abduction Limitations red tband    Hip Extension Stengthening;Both;15 reps;1 set    Extension Limitations  red tband    Lateral Step Up Right;Left;2 sets;10 reps;Hand Hold: 2    Lateral Step Up Limitations cueing for slower pace.      Knee/Hip Exercises: Seated   Sit to Sand 10 reps;without UE support      Shoulder Exercises: Standing   External Rotation Right;10 reps   2 sets   Theraband Level (Shoulder External Rotation) Level 2 (Red)    Internal Rotation Right;10 reps   2 sets   Theraband Level (Shoulder Internal Rotation) Level 2 (Red)      Shoulder Exercises: Pulleys   Flexion 2 minutes   3x; 20 sec stretch     Manual Therapy   Manual Therapy Joint mobilization    Manual therapy comments PROM for flexion & abduction    Joint Mobilization Grade III PA & inferior mobs R shoulder                  PT Education - 11/02/20 1730    Education Details To have a day of rest if he is experiencing alot of soreness. Verbal queing for proper pacing  and breathing to minimize lightheadedness.    Person(s) Educated Patient    Methods Explanation    Comprehension Verbalized understanding            PT Short Term Goals - 10/26/20 1724      PT SHORT TERM GOAL #1   Title pt to be I with inital HEP    Time 4    Period Weeks    Status New    Target Date 11/23/20      PT SHORT TERM GOAL #2   Title pt to increase gross hip strength to 4-/5 for therapuetic progression    Time 4    Period Weeks    Status New    Target Date 11/23/20      PT SHORT TERM GOAL #3   Title increase R shoulder flexion/ abductoin AROM by >/= 8 degress for functional progression    Time 4    Status New    Target Date 11/23/20      PT SHORT TERM GOAL #4   Title perfrom berg balance and 6 min walk test and update LTG's    Time 1    Period Weeks    Status New    Target Date 11/02/20             PT Long Term Goals - 10/26/20 1726      PT LONG TERM GOAL #1   Title pt to increase R shoulder AROM to [facility] compared bil with max of </= 2/10 pain for mobility required for ADLS    Time 8    Period Weeks    Status New    Target Date 12/21/20      PT LONG TERM GOAL #2   Title pt incrase gross RUE strength to >/= 4+/5 in available ROM to promote functioal strength for ADL's    Time 8    Period Weeks    Status New    Target Date 12/21/20      PT LONG TERM GOAL #3   Title increase bil hip gross strength to >/= 4+/5 to promote hip stability with walking/ standing max pain </= 2/10.    Time 8    Period Weeks    Status New    Target Date 12/21/20      PT LONG TERM GOAL #4   Title pt to be able to  return to personal exercise and walking routine with no report of limitations per pts personal goals.    Time 8    Period Weeks    Status New    Target Date 12/21/20      PT LONG TERM GOAL #5   Title increase FOTO score to >/= 61% function to demo improvement in function    Time 8    Period Weeks    Status New    Target Date 12/21/20                  Plan - 11/02/20 1527    Clinical Impression Statement PT was completed for LE and UE strengthening, and for R shoulder ROM with joint mobs and PROM. Pt participated with good effort. Pt did report occasional lightheadedness with some exs. Pt has a tendency to complete the exs quickly. Verbal queing was provided for proper pacing and breathing with to minimize lightheaded. Pt voiced understanding. R shoulder PROM for flexion measured 150d, indicating potential for improved R shoulder AROM and functional use of the R UE.    Personal Factors and Comorbidities Comorbidity 1;Social Background    Comorbidities poly Trauma,    Examination-Activity Limitations Stand    Stability/Clinical Decision Making Evolving/Moderate complexity    Clinical Decision Making Moderate    Rehab Potential Good    PT Frequency 2x / week    PT Duration 8 weeks    PT Treatment/Interventions ADLs/Self Care Home Management;Cryotherapy;Iontophoresis 4mg /ml Dexamethasone;Moist Heat;Ultrasound;Gait training;Stair training;Functional mobility training;Therapeutic activities;Therapeutic exercise;Balance training;Neuromuscular re-education;Patient/family education;Manual techniques;Passive range of motion;Dry needling;Taping    PT Next Visit Plan review/ update HEP PRN: BIL POSTERIOR HIP PRECAUTIONS UNTIL 11/22/2020, assess berg balance, 6 min walk test, R shoulder mobs and AROM, gross shoulder and hip STrengthening, bil hip strengthening.    PT Home Exercise Plan [id number] - Sit to Stand, Standing Hip Abduction & extension with Counter Support + red tband ,  Supine Hamstring Stretch with Strap, SLR ,Supine flexion with 1 lb, shoulder Internal Rotation,reps  Shoulder External Rotation, Standing Shoulder Row with Anchored Resistance    Consulted and Agree with Plan of Care Patient           Patient will benefit from skilled therapeutic intervention in order to improve the following deficits and impairments:  Improper body  mechanics,Increased muscle spasms,Decreased knowledge of precautions,Decreased strength,Postural dysfunction  Visit Diagnosis: Pain in left hip  Muscle weakness (generalized)  Stiffness of right shoulder, not elsewhere classified  Other abnormalities of gait and mobility     Problem List Patient Active Problem List   Diagnosis Date Noted  . Anxiety 03/23/2020  . Thrombocytopenia ([place])   . Alcohol abuse 10/03/2019  . 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

## 2020-11-08 ENCOUNTER — Other Ambulatory Visit: Payer: Self-pay

## 2020-11-08 ENCOUNTER — Ambulatory Visit (INDEPENDENT_AMBULATORY_CARE_PROVIDER_SITE_OTHER): Payer: Medicare Other

## 2020-11-08 ENCOUNTER — Ambulatory Visit: Payer: Medicare Other

## 2020-11-08 ENCOUNTER — Other Ambulatory Visit: Payer: Medicare Other

## 2020-11-08 DIAGNOSIS — M25611 Stiffness of right shoulder, not elsewhere classified: Secondary | ICD-10-CM

## 2020-11-08 DIAGNOSIS — M25552 Pain in left hip: Secondary | ICD-10-CM

## 2020-11-08 DIAGNOSIS — R2689 Other abnormalities of gait and mobility: Secondary | ICD-10-CM

## 2020-11-08 DIAGNOSIS — J90 Pleural effusion, not elsewhere classified: Secondary | ICD-10-CM | POA: Diagnosis not present

## 2020-11-08 DIAGNOSIS — M6281 Muscle weakness (generalized): Secondary | ICD-10-CM

## 2020-11-08 NOTE — Therapy (Signed)
[place], [place], [address] Phone: [phone]   Fax:  [phone]  Physical Therapy Treatment  Patient Details  Name: Danny Kerr MRN: [id number] Date of Birth: 11-16-1955 Referring Provider (PT): Altamese Brimfield MD   Encounter Date: 11/08/2020   PT End of Session - 11/08/20 2212    Visit Number 4    Number of Visits 17    Date for PT Re-Evaluation 12/21/20    Authorization Type MCR - KX mod at 15th visit    Progress Note Due on Visit 10    PT Start Time 1620    PT Stop Time 1705    PT Time Calculation (min) 45 min    Activity Tolerance Patient tolerated treatment well    Behavior During Therapy [facility] for tasks assessed/performed           Past Medical History:  Diagnosis Date  . Arrhythmia   . Arthritis   . Displaced segmental fracture of shaft of left femur, initial encounter for closed fracture ([place]) 07/16/2020  . ETOH abuse   . Pneumonia   . Rosacea   . Seasonal allergies     Past Surgical History:  Procedure Laterality Date  . COLONOSCOPY    . FEMUR IM NAIL Left 07/16/2020   Procedure: INTRAMEDULLARY (IM) NAIL FEMORAL;  Surgeon: Altamese [place], MD;  Location: [place];  Service: Orthopedics;  Laterality: Left;  . KNEE ARTHROTOMY  1977   rt knee  . RADIOLOGY WITH ANESTHESIA N/A 09/03/2017   Procedure: MIR CERVICAL [place];  Surgeon: Radiologist, Medication, MD;  Location: [place];  Service: Radiology;  Laterality: N/A;  . TOTAL KNEE ARTHROPLASTY Right 09/06/2014   Procedure: RIGHT TOTAL KNEE ARTHROPLASTY;  Surgeon: Gearlean Alf, MD;  [facility];  Service: Orthopedics;  Laterality: Right;    There were no vitals filed for this visit.   Subjective Assessment - 11/08/20 1626    Subjective Pt reports being consistent with his HEP. He reports L knee pain going up and own steps, and that his L quad feels weak.    Patient Stated Goals increase strenth, reduce atrophy,  maintain healthy weight    Currently in Pain? Yes    Pain Score 7    intermittent with steps; cuurently 0/10   Pain Location Knee    Pain Orientation Right    Pain Descriptors / Indicators Aching    Pain Type Chronic pain    Pain Onset More than a month ago    Pain Frequency Intermittent    Aggravating Factors  steps, over doing exs              [facility] PT Assessment - 11/08/20 0001      AROM   Right Shoulder Flexion 110 Degrees   PROM 150                        [facility] Adult PT Treatment/Exercise - 11/08/20 0001      Exercises   Exercises Knee/Hip;Shoulder      Knee/Hip Exercises: Stretches   Active Hamstring Stretch Right;Left;3 reps;20 seconds    Active Hamstring Stretch Limitations Seated c hip flexion not past 90d      Knee/Hip Exercises: Seated   Long Arc Quad Right;Left;2 sets;15 reps      Knee/Hip Exercises: Supine   Bridges Both;15 reps    Straight Leg Raises Right;Left;2 sets    Straight Leg Raises Limitations R 15,12 reps.  L 12x2      Knee/Hip Exercises: Sidelying   Hip ABduction Right;Left;2 sets;15 reps    Hip ABduction Limitations 2 lbs      Shoulder Exercises: ROM/Strengthening   Other ROM/Strengthening Exercises Shoulder ladder 3x; 20 sec      Manual Therapy   Manual Therapy Joint mobilization    Manual therapy comments PROM for flexion    Joint Mobilization Grade III PA & inferior mobs R shoulder                    PT Short Term Goals - 10/26/20 1724      PT SHORT TERM GOAL #1   Title pt to be I with inital HEP    Time 4    Period Weeks    Status New    Target Date 11/23/20      PT SHORT TERM GOAL #2   Title pt to increase gross hip strength to 4-/5 for therapuetic progression    Time 4    Period Weeks    Status New    Target Date 11/23/20      PT SHORT TERM GOAL #3   Title increase R shoulder flexion/ abductoin AROM by >/= 8 degress for functional progression    Time 4    Status New    Target Date 11/23/20       PT SHORT TERM GOAL #4   Title perfrom berg balance and 6 min walk test and update LTG's    Time 1    Period Weeks    Status New    Target Date 11/02/20             PT Long Term Goals - 10/26/20 1726      PT LONG TERM GOAL #1   Title pt to increase R shoulder AROM to [facility] compared bil with max of </= 2/10 pain for mobility required for ADLS    Time 8    Period Weeks    Status New    Target Date 12/21/20      PT LONG TERM GOAL #2   Title pt incrase gross RUE strength to >/= 4+/5 in available ROM to promote functioal strength for ADL's    Time 8    Period Weeks    Status New    Target Date 12/21/20      PT LONG TERM GOAL #3   Title increase bil hip gross strength to >/= 4+/5 to promote hip stability with walking/ standing max pain </= 2/10.    Time 8    Period Weeks    Status New    Target Date 12/21/20      PT LONG TERM GOAL #4   Title pt to be able to return to personal exercise and walking routine with no report of limitations per pts personal goals.    Time 8    Period Weeks    Status New    Target Date 12/21/20      PT LONG TERM GOAL #5   Title increase FOTO score to >/= 61% function to demo improvement in function    Time 8    Period Weeks    Status New    Target Date 12/21/20                 Plan - 11/08/20 2212    Clinical Impression Statement PT was focused on quad/LE strengthening to address functional ability to asc/dsc steps. Additionally, joint mobs and  PROM was provided to address R shoulder flexion. Pt completed with good effort and experinced no adverse effects.    Personal Factors and Comorbidities Comorbidity 1;Social Background    Comorbidities poly Trauma,    Examination-Activity Limitations Stand    Stability/Clinical Decision Making Evolving/Moderate complexity    Clinical Decision Making Moderate    Rehab Potential Good    PT Frequency 2x / week    PT Duration 8 weeks    PT Treatment/Interventions ADLs/Self Care Home  Management;Cryotherapy;Iontophoresis 4mg /ml Dexamethasone;Moist Heat;Ultrasound;Gait training;Stair training;Functional mobility training;Therapeutic activities;Therapeutic exercise;Balance training;Neuromuscular re-education;Patient/family education;Manual techniques;Passive range of motion;Dry needling;Taping    PT Next Visit Plan review/ update HEP PRN: BIL POSTERIOR HIP PRECAUTIONS UNTIL 11/22/2020, assess berg balance, 6 min walk test, R shoulder mobs and AROM, gross shoulder and hip STrengthening, bil hip strengthening.    PT Home Exercise Plan [id number] - Sit to Stand, Standing Hip Abduction & extension with Counter Support + red tband ,  Supine Hamstring Stretch with Strap, SLR ,Supine flexion with 1 lb, shoulder Internal Rotation,reps  Shoulder External Rotation, Standing Shoulder Row with Anchored Resistance    Consulted and Agree with Plan of Care Patient           Patient will benefit from skilled therapeutic intervention in order to improve the following deficits and impairments:  Improper body mechanics,Increased muscle spasms,Decreased knowledge of precautions,Decreased strength,Postural dysfunction  Visit Diagnosis: Pain in left hip  Muscle weakness (generalized)  Stiffness of right shoulder, not elsewhere classified  Other abnormalities of gait and mobility     Problem List Patient Active Problem List   Diagnosis Date Noted  . Anxiety 03/23/2020  . Thrombocytopenia ([place])   . Alcohol abuse 10/03/2019  . 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

## 2020-11-09 ENCOUNTER — Other Ambulatory Visit: Payer: Self-pay

## 2020-11-09 DIAGNOSIS — J90 Pleural effusion, not elsewhere classified: Secondary | ICD-10-CM

## 2020-11-09 NOTE — Progress Notes (Signed)
Please inform patient of the following:  Xray shows persistent pleural effusion. Recommend referral to cardiothoracic surgery to have it drained and analyzed. Please place referral.  Sharry Beining M. Jerline Pain, MD 11/09/2020 10:18 AM

## 2020-11-11 ENCOUNTER — Other Ambulatory Visit: Payer: Self-pay

## 2020-11-11 ENCOUNTER — Ambulatory Visit: Payer: Medicare Other

## 2020-11-11 DIAGNOSIS — M6281 Muscle weakness (generalized): Secondary | ICD-10-CM

## 2020-11-11 DIAGNOSIS — M25611 Stiffness of right shoulder, not elsewhere classified: Secondary | ICD-10-CM

## 2020-11-11 DIAGNOSIS — M25552 Pain in left hip: Secondary | ICD-10-CM | POA: Diagnosis not present

## 2020-11-11 DIAGNOSIS — R2689 Other abnormalities of gait and mobility: Secondary | ICD-10-CM

## 2020-11-11 NOTE — Therapy (Signed)
[place], [place], [address] Phone: [phone]   Fax:  [phone]  Physical Therapy Treatment  Patient Details  Name: Danny Kerr MRN: [id number] Date of Birth: October 02, 1956 Referring Provider (PT): Altamese [place] MD   Encounter Date: 11/11/2020   PT End of Session - 11/11/20 1238    Visit Number 5    Number of Visits 17    Date for PT Re-Evaluation 12/21/20    Authorization Type MCR - KX mod at 15th visit    Progress Note Due on Visit 10    PT Start Time 1230    PT Stop Time 1310    PT Time Calculation (min) 40 min    Activity Tolerance Patient tolerated treatment well    Behavior During Therapy [facility] for tasks assessed/performed           Past Medical History:  Diagnosis Date   Arrhythmia    Arthritis    Displaced segmental fracture of shaft of left femur, initial encounter for closed fracture ([place]) 07/16/2020   ETOH abuse    Pneumonia    Rosacea    Seasonal allergies     Past Surgical History:  Procedure Laterality Date   COLONOSCOPY     FEMUR IM NAIL Left 07/16/2020   Procedure: INTRAMEDULLARY (IM) NAIL FEMORAL;  Surgeon: Altamese [place]ard, MD;  Location: [place];  Service: Orthopedics;  Laterality: Left;   KNEE ARTHROTOMY  1977   rt knee   RADIOLOGY WITH ANESTHESIA N/A 09/03/2017   Procedure: MIR CERVICAL [name] CONSTRAST;  Surgeon: Radiologist, Medication, MD;  Location: [place];  Service: Radiology;  Laterality: N/A;   TOTAL KNEE ARTHROPLASTY Right 09/06/2014   Procedure: RIGHT TOTAL KNEE ARTHROPLASTY;  Surgeon: Gearlean Alf, MD;  [facility];  Service: Orthopedics;  Laterality: Right;    There were no vitals filed for this visit.   Subjective Assessment - 11/11/20 1237    Subjective "I was here the other day, so I'm a little stiff but that's it."    How long can you sit comfortably? unlimited    How long can you stand comfortably? unlimtied    How long can you  walk comfortably? unlimited    Patient Stated Goals increase strenth, reduce atrophy, maintain healthy weight    Currently in Pain? No/denies    Pain Onset More than a month ago              [facility] PT Assessment - 11/11/20 0001      AROM   Right Shoulder Flexion 124 Degrees      6 minute walk test results    Aerobic Endurance Distance Walked 1160                         [facility] Adult PT Treatment/Exercise - 11/11/20 0001      Knee/Hip Exercises: Stretches   Active Hamstring Stretch Right;Left;3 reps;20 seconds    Active Hamstring Stretch Limitations Seated c hip flexion not past 90d      Knee/Hip Exercises: Standing   Heel Raises Both;2 sets;15 reps    Heel Raises Limitations 1 set on level ground; 1 set on airex    Hip Abduction Stengthening;Both;2 sets;15 reps;Knee straight    Forward Step Up Left;2 sets;15 reps;Step Height: 4"    Forward Step Up Limitations LLE ascend; RLE descend    SLS 4 x 10 sec on airex with UE support at airex  Knee/Hip Exercises: Seated   Long Arc Quad Right;Left;2 sets;15 reps    Long Arc Quad Weight 3 lbs.    Sit to Sand 2 sets;15 reps;without UE support   lifting yellow medicine ball to 90 deg with BUE     Knee/Hip Exercises: Supine   Bridges with [employer] Strengthening;Both;2 sets;15 reps    Straight Leg Raises Strengthening;Both;2 sets;15 reps    Straight Leg Raises Limitations 2# ankle weights    Straight Leg Raise with External Rotation Strengthening;Both;2 sets;10 reps      Knee/Hip Exercises: Sidelying   Hip ABduction Strengthening;Both;2 sets;15 reps      Shoulder Exercises: ROM/Strengthening   Other ROM/Strengthening Exercises Finger ladder 5 x 10 seconds FL and ABD                  PT Education - 11/11/20 1519    Education Details To continue with HEP within tolerance but to avoid excessive soreness and rest as needed    Person(s) Educated Patient    Methods Explanation;Verbal cues     Comprehension Verbalized understanding            PT Short Term Goals - 10/26/20 1724      PT SHORT TERM GOAL #1   Title pt to be I with inital HEP    Time 4    Period Weeks    Status New    Target Date 11/23/20      PT SHORT TERM GOAL #2   Title pt to increase gross hip strength to 4-/5 for therapuetic progression    Time 4    Period Weeks    Status New    Target Date 11/23/20      PT SHORT TERM GOAL #3   Title increase R shoulder flexion/ abductoin AROM by >/= 8 degress for functional progression    Time 4    Status New    Target Date 11/23/20      PT SHORT TERM GOAL #4   Title perfrom berg balance and 6 min walk test and update LTG's    Time 1    Period Weeks    Status New    Target Date 11/02/20             PT Long Term Goals - 10/26/20 1726      PT LONG TERM GOAL #1   Title pt to increase R shoulder AROM to [facility] compared bil with max of </= 2/10 pain for mobility required for ADLS    Time 8    Period Weeks    Status New    Target Date 12/21/20      PT LONG TERM GOAL #2   Title pt incrase gross RUE strength to >/= 4+/5 in available ROM to promote functioal strength for ADL's    Time 8    Period Weeks    Status New    Target Date 12/21/20      PT LONG TERM GOAL #3   Title increase bil hip gross strength to >/= 4+/5 to promote hip stability with walking/ standing max pain </= 2/10.    Time 8    Period Weeks    Status New    Target Date 12/21/20      PT LONG TERM GOAL #4   Title pt to be able to return to personal exercise and walking routine with no report of limitations per pts personal goals.    Time 8    Period Weeks  Status New    Target Date 12/21/20      PT LONG TERM GOAL #5   Title increase FOTO score to >/= 61% function to demo improvement in function    Time 8    Period Weeks    Status New    Target Date 12/21/20                 Plan - 11/11/20 1241    Clinical Impression Statement Patient tolerated session well with  some fatigue but no complaints of pain. Pt expresses that he is more limited due to getting "winded" more than his muscle fatigue but states he can tell he is working his muscles. Pt is very motivated and should continue to progress well with skilled PT.    Personal Factors and Comorbidities Comorbidity 1;Social Background    Comorbidities poly Trauma,    Examination-Activity Limitations Stand    Stability/Clinical Decision Making Evolving/Moderate complexity    Rehab Potential Good    PT Frequency 2x / week    PT Duration 8 weeks    PT Treatment/Interventions ADLs/Self Care Home Management;Cryotherapy;Iontophoresis 4mg /ml Dexamethasone;Moist Heat;Ultrasound;Gait training;Stair training;Functional mobility training;Therapeutic activities;Therapeutic exercise;Balance training;Neuromuscular re-education;Patient/family education;Manual techniques;Passive range of motion;Dry needling;Taping    PT Next Visit Plan review/update HEP PRN: BIL POSTERIOR HIP PRECAUTIONS UNTIL 11/22/2020, R shoulder mobs and AROM, gross shoulder and hip strengthening, bil hip strengthening.    PT Home Exercise Plan [id number] - Sit to Stand, Standing Hip Abduction & extension with Counter Support + red tband ,  Supine Hamstring Stretch with Strap, SLR ,Supine flexion with 1 lb, shoulder Internal Rotation,reps  Shoulder External Rotation, Standing Shoulder Row with Anchored Resistance    Consulted and Agree with Plan of Care Patient           Patient will benefit from skilled therapeutic intervention in order to improve the following deficits and impairments:  Improper body mechanics,Increased muscle spasms,Decreased knowledge of precautions,Decreased strength,Postural dysfunction  Visit Diagnosis: Pain in left hip  Muscle weakness (generalized)  Stiffness of right shoulder, not elsewhere classified  Other abnormalities of gait and mobility     Problem List Patient Active Problem List   Diagnosis Date Noted    Anxiety 03/23/2020   Thrombocytopenia ([place])    Alcohol abuse 10/03/2019   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 [place], [place], [address] Phone: [phone]   Fax:  [phone]  Name: Danny Kerr MRN: [id number] Date of Birth: [birth year]

## 2020-11-15 ENCOUNTER — Ambulatory Visit: Payer: Medicare Other

## 2020-11-15 ENCOUNTER — Other Ambulatory Visit: Payer: Self-pay

## 2020-11-15 DIAGNOSIS — M25552 Pain in left hip: Secondary | ICD-10-CM

## 2020-11-15 DIAGNOSIS — R2689 Other abnormalities of gait and mobility: Secondary | ICD-10-CM

## 2020-11-15 DIAGNOSIS — M25611 Stiffness of right shoulder, not elsewhere classified: Secondary | ICD-10-CM

## 2020-11-15 DIAGNOSIS — M6281 Muscle weakness (generalized): Secondary | ICD-10-CM

## 2020-11-15 NOTE — Therapy (Signed)
[place], [place], [address] Phone: [phone]   Fax:  [phone]  Physical Therapy Treatment  Patient Details  Name: Danny Kerr MRN: [id number] Date of Birth: 07-22-1956 Referring Provider (PT): Altamese [place] MD   Encounter Date: 11/15/2020   PT End of Session - 11/15/20 1809    Visit Number 6    Date for PT Re-Evaluation 12/21/20    Authorization Type MCR - KX mod at 15th visit    Progress Note Due on Visit 10    PT Start Time 1535    PT Stop Time 1625    PT Time Calculation (min) 50 min    Activity Tolerance Patient tolerated treatment well    Behavior During Therapy [facility] for tasks assessed/performed           Past Medical History:  Diagnosis Date  . Arrhythmia   . Arthritis   . Displaced segmental fracture of shaft of left femur, initial encounter for closed fracture ([place]) 07/16/2020  . ETOH abuse   . Pneumonia   . Rosacea   . Seasonal allergies     Past Surgical History:  Procedure Laterality Date  . COLONOSCOPY    . FEMUR IM NAIL Left 07/16/2020   Procedure: INTRAMEDULLARY (IM) NAIL FEMORAL;  Surgeon: Altamese [place], MD;  Location: [place];  Service: Orthopedics;  Laterality: Left;  . KNEE ARTHROTOMY  1977   rt knee  . RADIOLOGY WITH ANESTHESIA N/A 09/03/2017   Procedure: MIR CERVICAL Burt Ek;  Surgeon: Radiologist, Medication, MD;  Location: [place];  Service: Radiology;  Laterality: N/A;  . TOTAL KNEE ARTHROPLASTY Right 09/06/2014   Procedure: RIGHT TOTAL KNEE ARTHROPLASTY;  Surgeon: Gearlean Alf, MD;  [facility];  Service: Orthopedics;  Laterality: Right;    There were no vitals filed for this visit.   Subjective Assessment - 11/15/20 1543    Subjective I'm having L knee pain, especially with asc/dsc steps.    Patient Stated Goals increase strenth, reduce atrophy, maintain healthy weight    Currently in Pain? Yes    Pain Score 7     Pain Location Knee     Pain Orientation Left    Pain Descriptors / Indicators Aching;Sharp    Pain Type Acute pain    Pain Onset In the past 7 days    Pain Frequency Intermittent    Aggravating Factors  steps                             [facility] Adult PT Treatment/Exercise - 11/15/20 0001      Exercises   Exercises Knee/Hip;Shoulder      Knee/Hip Exercises: Aerobic   Nustep L7; 8 mins; UEs /LES      Knee/Hip Exercises: Standing   Hip Flexion Stengthening;Right;Left;1 set;15 reps;Knee straight    Hip Abduction Stengthening;15 reps;Knee straight;Right;Left;1 set    Hip Extension Stengthening;Right;Left;1 set;15 reps      Knee/Hip Exercises: Seated   Long Arc Quad Left;2 sets   12x   Long Arc Quad Limitations ball squeeze      Knee/Hip Exercises: Supine   Straight Leg Raises Strengthening;Both;2 sets;15 reps    Straight Leg Raises Limitations 2# ankle weights    Straight Leg Raise with External Rotation Strengthening;Both;2 sets;10 reps    Other Supine Knee/Hip Exercises L clam 15x2; green Tband      Knee/Hip Exercises: Sidelying   Hip ABduction Strengthening;Both;2 sets;15  reps                  PT Education - 11/15/20 1808    Education Details HEP, DC sit to stand exs for the near future    Person(s) Educated Patient    Methods Explanation;Demonstration;Tactile cues;Verbal cues;Handout    Comprehension Verbalized understanding;Returned demonstration;Verbal cues required;Tactile cues required;Need further instruction            PT Short Term Goals - 10/26/20 1724      PT SHORT TERM GOAL #1   Title pt to be I with inital HEP    Time 4    Period Weeks    Status New    Target Date 11/23/20      PT SHORT TERM GOAL #2   Title pt to increase gross hip strength to 4-/5 for therapuetic progression    Time 4    Period Weeks    Status New    Target Date 11/23/20      PT SHORT TERM GOAL #3   Title increase R shoulder flexion/ abductoin AROM by >/= 8 degress for  functional progression    Time 4    Status New    Target Date 11/23/20      PT SHORT TERM GOAL #4   Title perfrom berg balance and 6 min walk test and update LTG's    Time 1    Period Weeks    Status New    Target Date 11/02/20             PT Long Term Goals - 10/26/20 1726      PT LONG TERM GOAL #1   Title pt to increase R shoulder AROM to [facility] compared bil with max of </= 2/10 pain for mobility required for ADLS    Time 8    Period Weeks    Status New    Target Date 12/21/20      PT LONG TERM GOAL #2   Title pt incrase gross RUE strength to >/= 4+/5 in available ROM to promote functioal strength for ADL's    Time 8    Period Weeks    Status New    Target Date 12/21/20      PT LONG TERM GOAL #3   Title increase bil hip gross strength to >/= 4+/5 to promote hip stability with walking/ standing max pain </= 2/10.    Time 8    Period Weeks    Status New    Target Date 12/21/20      PT LONG TERM GOAL #4   Title pt to be able to return to personal exercise and walking routine with no report of limitations per pts personal goals.    Time 8    Period Weeks    Status New    Target Date 12/21/20      PT LONG TERM GOAL #5   Title increase FOTO score to >/= 61% function to demo improvement in function    Time 8    Period Weeks    Status New    Target Date 12/21/20                 Plan - 11/15/20 1811    Clinical Impression Statement Pt is having more complaints of L knee pain. The [place] and ligamentous tests were negative. Pt is TTP of the lateral border of the patellar tendon. Pian seams to be parellofemoral in nature. CKC exs with knee bending  involved were stopped for the time being. WIll continue with quad strengthening primarily with the L knee extended in supine and standing. Pt participates with good effort, and tolerated the session without adverse effects.    Personal Factors and Comorbidities Comorbidity 1;Social Background    Comorbidities poly  Trauma,    Examination-Activity Limitations Stand    Stability/Clinical Decision Making Evolving/Moderate complexity    Clinical Decision Making Moderate    Rehab Potential Good    PT Frequency 2x / week    PT Duration 8 weeks    PT Treatment/Interventions ADLs/Self Care Home Management;Cryotherapy;Iontophoresis 4mg /ml Dexamethasone;Moist Heat;Ultrasound;Gait training;Stair training;Functional mobility training;Therapeutic activities;Therapeutic exercise;Balance training;Neuromuscular re-education;Patient/family education;Manual techniques;Passive range of motion;Dry needling;Taping    PT Next Visit Plan Complete FOTO. Assess response of L knee to open chain and CKC chain exs without knee bending.    PT Home Exercise Plan [id number] - Sit to Stand, Standing Hip Abduction & extension with Counter Support + red tband ,  Supine Hamstring Stretch with Strap, SLR ,Supine flexion with 1 lb, shoulder Internal Rotation,reps  Shoulder External Rotation, Standing Shoulder Row with Anchored Resistance. pt is to hold on Sit to Stand exs    Consulted and Agree with Plan of Care Patient           Patient will benefit from skilled therapeutic intervention in order to improve the following deficits and impairments:  Improper body mechanics,Increased muscle spasms,Decreased knowledge of precautions,Decreased strength,Postural dysfunction  Visit Diagnosis: Pain in left hip  Muscle weakness (generalized)  Other abnormalities of gait and mobility  Stiffness of right shoulder, not elsewhere classified     Problem List Patient Active Problem List   Diagnosis Date Noted  . Anxiety 03/23/2020  . Thrombocytopenia ([place])   . Alcohol abuse 10/03/2019  . 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

## 2020-11-17 ENCOUNTER — Other Ambulatory Visit: Payer: Self-pay

## 2020-11-17 ENCOUNTER — Ambulatory Visit: Payer: Medicare Other

## 2020-11-17 DIAGNOSIS — M6281 Muscle weakness (generalized): Secondary | ICD-10-CM

## 2020-11-17 DIAGNOSIS — M25611 Stiffness of right shoulder, not elsewhere classified: Secondary | ICD-10-CM

## 2020-11-17 DIAGNOSIS — M25552 Pain in left hip: Secondary | ICD-10-CM

## 2020-11-17 DIAGNOSIS — R2689 Other abnormalities of gait and mobility: Secondary | ICD-10-CM

## 2020-11-17 DIAGNOSIS — R262 Difficulty in walking, not elsewhere classified: Secondary | ICD-10-CM

## 2020-11-17 NOTE — Therapy (Signed)
[place], [place], [address] Phone: [phone]   Fax:  [phone]  Physical Therapy Treatment  Patient Details  Name: Danny Kerr MRN: [id number] Date of Birth: [birth year] Referring Provider (PT): Altamese [place] MD   Encounter Date: 11/17/2020   PT End of Session - 11/17/20 1719    Visit Number 7    Number of Visits 17    Date for PT Re-Evaluation 12/21/20    Authorization Type MCR - KX mod at 15th visit    Progress Note Due on Visit 10    PT Start Time 1700    PT Stop Time 1745    PT Time Calculation (min) 45 min    Activity Tolerance Patient tolerated treatment well    Behavior During Therapy [facility] for tasks assessed/performed           Past Medical History:  Diagnosis Date  . Arrhythmia   . Arthritis   . Displaced segmental fracture of shaft of left femur, initial encounter for closed fracture ([place]) 07/16/2020  . ETOH abuse   . Pneumonia   . Rosacea   . Seasonal allergies     Past Surgical History:  Procedure Laterality Date  . COLONOSCOPY    . FEMUR IM NAIL Left 07/16/2020   Procedure: INTRAMEDULLARY (IM) NAIL FEMORAL;  Surgeon: Altamese [place], MD;  Location: [place];  Service: Orthopedics;  Laterality: Left;  . KNEE ARTHROTOMY  1977   rt knee  . RADIOLOGY WITH ANESTHESIA N/A 09/03/2017   Procedure: MIR CERVICAL Burt Ek;  Surgeon: Radiologist, Medication, MD;  Location: [place];  Service: Radiology;  Laterality: N/A;  . TOTAL KNEE ARTHROPLASTY Right 09/06/2014   Procedure: RIGHT TOTAL KNEE ARTHROPLASTY;  Surgeon: Gearlean Alf, MD;  [facility];  Service: Orthopedics;  Laterality: Right;    There were no vitals filed for this visit.   Subjective Assessment - 11/17/20 1707    Subjective Pt reports his L knee pain is improved    Patient Stated Goals increase strenth, reduce atrophy, maintain healthy weight    Currently in Pain? Yes    Pain Score 2     Pain Location  Knee    Pain Orientation Left    Pain Descriptors / Indicators Aching;Sharp    Pain Type Acute pain    Pain Onset In the past 7 days    Pain Frequency Intermittent    Aggravating Factors  Steps    Multiple Pain Sites No              [facility] PT Assessment - 11/17/20 0001      AROM   Right Shoulder Flexion 123 Degrees                         [facility] Adult PT Treatment/Exercise - 11/17/20 0001      Knee/Hip Exercises: Aerobic   Nustep L7; 8 mins; UEs /LES      Knee/Hip Exercises: Standing   Hip Flexion Stengthening;Right;Left;1 set;15 reps;Knee straight    Hip Flexion Limitations 2 lbs    Hip Abduction Stengthening;15 reps;Knee straight;Right;Left;1 set    Abduction Limitations 2 lbs    Hip Extension Stengthening;Right;Left;1 set;15 reps    Extension Limitations 2 lbs    SLS 2x, each LE, 30 sec, light 2 hand touch      Knee/Hip Exercises: Seated   Long Arc Quad Right;Left;2 sets;10 reps   12x   Long [employer]  Weight 2 lbs.    Long Arc Quad Limitations ball squeeze      Shoulder Exercises: Standing   Flexion Both;10 reps   2 sets   Shoulder Flexion Weight (lbs) 2    Other Standing Exercises [place]; Both, 10x2, 2 lbs      Shoulder Exercises: Pulleys   Flexion 1 minute    Flexion Limitations f/g 3x, shoulder flexion stretch for 30 sec               Balance Exercises - 11/17/20 0001      Balance Exercises: Standing   Tandem Stance Eyes open;Upper extremity support 1;2 reps;30 secs    Other Standing Exercises Soft rocker board, balance, 2x, 30 sec, 2 hand assist               PT Short Term Goals - 10/26/20 1724      PT SHORT TERM GOAL #1   Title pt to be I with inital HEP    Time 4    Period Weeks    Status New    Target Date 11/23/20      PT SHORT TERM GOAL #2   Title pt to increase gross hip strength to 4-/5 for therapuetic progression    Time 4    Period Weeks    Status New    Target Date 11/23/20      PT SHORT TERM GOAL #3    Title increase R shoulder flexion/ abductoin AROM by >/= 8 degress for functional progression    Time 4    Status New    Target Date 11/23/20      PT SHORT TERM GOAL #4   Title perfrom berg balance and 6 min walk test and update LTG's    Time 1    Period Weeks    Status New    Target Date 11/02/20             PT Long Term Goals - 11/17/20 2350      PT LONG TERM GOAL #5   Title increase FOTO score to >/= 61% function to demo improvement in function. 11/17/20: 75%    Status Achieved    Target Date 11/17/20                 Plan - 11/17/20 1722    Clinical Impression Statement Pt reports improved L knee pain since the last session. Strengthening and balance exs were completd in the open chain or closed chain without knee joint motion. The FOTO was re-assess and it surpassed the predicted value of 61% to 75%. ROM and strengthen was also completed for the shoulders. Overall, R shoulder ROM has increased , but is similar to 1 week ago at 123d. Pt is making appropriate functional gains for the time frame of his care.    Personal Factors and Comorbidities Comorbidity 1;Social Background    Comorbidities poly Trauma,    Examination-Activity Limitations Stand    Stability/Clinical Decision Making Evolving/Moderate complexity    Clinical Decision Making Moderate    Rehab Potential Good    PT Frequency 2x / week    PT Duration 8 weeks    PT Treatment/Interventions ADLs/Self Care Home Management;Cryotherapy;Iontophoresis 4mg /ml Dexamethasone;Moist Heat;Ultrasound;Gait training;Stair training;Functional mobility training;Therapeutic activities;Therapeutic exercise;Balance training;Neuromuscular re-education;Patient/family education;Manual techniques;Passive range of motion;Dry needling;Taping    PT Next Visit Plan Complete FOTO. Assess response of L knee to open chain and CKC chain exs without knee bending.    PT Home Exercise Plan [id number] -  Sit to Stand, Standing Hip Abduction &  extension with Counter Support + red tband ,  Supine Hamstring Stretch with Strap, SLR ,Supine flexion with 1 lb, shoulder Internal Rotation,reps  Shoulder External Rotation, Standing Shoulder Row with Anchored Resistance. pt is to hold on Sit to Stand exs    Consulted and Agree with Plan of Care Patient           Patient will benefit from skilled therapeutic intervention in order to improve the following deficits and impairments:  Improper body mechanics,Increased muscle spasms,Decreased knowledge of precautions,Decreased strength,Postural dysfunction  Visit Diagnosis: No diagnosis found.     Problem List Patient Active Problem List   Diagnosis Date Noted  . Anxiety 03/23/2020  . Thrombocytopenia ([place])   . Alcohol abuse 10/03/2019  . 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 [place], [place], [address] Phone: [phone]   Fax:  [phone]  Name: Danny Kerr MRN: [id number] Date of Birth: [birth year]

## 2020-11-21 ENCOUNTER — Other Ambulatory Visit: Payer: Self-pay | Admitting: Thoracic Surgery (Cardiothoracic Vascular Surgery)

## 2020-11-21 DIAGNOSIS — D696 Thrombocytopenia, unspecified: Secondary | ICD-10-CM

## 2020-11-22 ENCOUNTER — Institutional Professional Consult (permissible substitution) (INDEPENDENT_AMBULATORY_CARE_PROVIDER_SITE_OTHER): Payer: Medicare Other | Admitting: Thoracic Surgery (Cardiothoracic Vascular Surgery)

## 2020-11-22 ENCOUNTER — Other Ambulatory Visit: Payer: Self-pay

## 2020-11-22 ENCOUNTER — Encounter: Payer: Self-pay | Admitting: Physical Therapy

## 2020-11-22 ENCOUNTER — Encounter: Payer: Self-pay | Admitting: Thoracic Surgery (Cardiothoracic Vascular Surgery)

## 2020-11-22 ENCOUNTER — Ambulatory Visit
Admission: RE | Admit: 2020-11-22 | Discharge: 2020-11-22 | Disposition: A | Discharge status: Home / Self Care | Payer: Medicare Other | Source: Ambulatory Visit | Attending: Thoracic Surgery (Cardiothoracic Vascular Surgery) | Admitting: Thoracic Surgery (Cardiothoracic Vascular Surgery)

## 2020-11-22 ENCOUNTER — Ambulatory Visit: Payer: Medicare Other | Attending: Orthopedic Surgery | Admitting: Physical Therapy

## 2020-11-22 ENCOUNTER — Other Ambulatory Visit: Payer: Self-pay | Admitting: Thoracic Surgery (Cardiothoracic Vascular Surgery)

## 2020-11-22 DIAGNOSIS — D696 Thrombocytopenia, unspecified: Secondary | ICD-10-CM

## 2020-11-22 DIAGNOSIS — M25552 Pain in left hip: Secondary | ICD-10-CM

## 2020-11-22 DIAGNOSIS — M25611 Stiffness of right shoulder, not elsewhere classified: Secondary | ICD-10-CM

## 2020-11-22 DIAGNOSIS — M6281 Muscle weakness (generalized): Secondary | ICD-10-CM | POA: Insufficient documentation

## 2020-11-22 DIAGNOSIS — R2689 Other abnormalities of gait and mobility: Secondary | ICD-10-CM | POA: Diagnosis present

## 2020-11-22 DIAGNOSIS — J90 Pleural effusion, not elsewhere classified: Secondary | ICD-10-CM | POA: Insufficient documentation

## 2020-11-22 DIAGNOSIS — R262 Difficulty in walking, not elsewhere classified: Secondary | ICD-10-CM

## 2020-11-22 MED ORDER — PREDNISONE 10 MG PO TABS: 10.0000 mg | ORAL_TABLET | Freq: Every day | ORAL | 0 refills | Status: DC

## 2020-11-22 NOTE — Therapy (Signed)
[place], [place], [address] Phone: [phone]   Fax:  [phone]  Physical Therapy Treatment  Patient Details  Name: Danny Kerr MRN: [id number] Date of Birth: October 02, 1956 Referring Provider (PT): Danny [place] MD   Encounter Date: 11/22/2020   PT End of Session - 11/22/20 1458    Visit Number 8    Number of Visits 17    Date for PT Re-Evaluation 12/21/20    Authorization Type MCR - KX mod at 15th visit    PT Start Time 1751    PT Stop Time 1540    PT Time Calculation (min) 42 min    Activity Tolerance Patient tolerated treatment well    Behavior During Therapy Danny Kerr [place] for tasks assessed/performed           Past Medical History:  Diagnosis Date  . Arrhythmia   . Arthritis   . Displaced segmental fracture of shaft of left femur, initial encounter for closed fracture ([place]) 07/16/2020  . ETOH abuse   . Pneumonia   . Rosacea   . Seasonal allergies     Past Surgical History:  Procedure Laterality Date  . COLONOSCOPY    . FEMUR IM NAIL Left 07/16/2020   Procedure: INTRAMEDULLARY (IM) NAIL FEMORAL;  Surgeon: Danny [place], MD;  Location: [place];  Service: Orthopedics;  Laterality: Left;  . KNEE ARTHROTOMY  1977   rt knee  . RADIOLOGY WITH ANESTHESIA N/A 09/03/2017   Procedure: MIR CERVICAL Burt Ek;  Surgeon: Radiologist, Medication, MD;  Location: [place];  Service: Radiology;  Laterality: N/A;  . TOTAL KNEE ARTHROPLASTY Right 09/06/2014   Procedure: RIGHT TOTAL KNEE ARTHROPLASTY;  Surgeon: Danny Alf, MD;  [facility];  Service: Orthopedics;  Laterality: Right;    There were no vitals filed for this visit.   Subjective Assessment - 11/22/20 1458    Subjective "I've been having alittle pain int he L knee, but I think it has been going on before the accident"    Patient Stated Goals increase strenth, reduce atrophy, maintain healthy weight    Currently in Pain? Yes    Pain  Score 0-No pain    Pain Orientation Left    Pain Descriptors / Indicators Aching    Pain Type Chronic pain    Pain Onset More than a month ago    Pain Frequency Intermittent                             [facility] Adult PT Treatment/Exercise - 11/22/20 0001      Knee/Hip Exercises: Aerobic   Nustep L7 x 8 min LE only      Knee/Hip Exercises: Machines for Strengthening   Cybex Knee Extension 2 x 15 15#    Cybex Knee Flexion 2 x 15 20#      Knee/Hip Exercises: Standing   Hip Flexion Stengthening;Both;15 reps   with red therdaband around ankles   Hip Flexion Limitations bil hand hold onto free motion    Hip Abduction Stengthening;Both;2 sets;15 reps;Knee straight   green band around ankles   Hip Extension Stengthening;Both;2 sets;15 reps;Knee straight   green band around the ankles   Forward Step Up 2 sets;15 reps;Step Height: 8"   with 1 HHA for stability   Step Down 1 set;10 reps;Step Height: 6"    Functional Squat 2 sets;10 reps   holding onto free motion   Gait Training forward/  retro gait in // cues for proper form, heel strike/ toe off, 4 x 25 ft heel strike/ toe off               Balance Exercises - 11/22/20 0001      Balance Exercises: Standing   Standing Eyes Opened 2 reps;30 secs;Narrow base of support (BOS)    Standing Eyes Closed Narrow base of support (BOS);30 secs;3 reps    Partial Tandem Stance Eyes open;4 reps;30 secs   alternating lead leg            PT Education - 11/22/20 1538    Education Details Reviewed benefits of heel strike/ toe off to promote efficient gait pattern    Person(s) Educated Patient    Methods Explanation;Verbal cues    Comprehension Verbalized understanding;Verbal cues required            PT Short Term Goals - 10/26/20 1724      PT SHORT TERM GOAL #1   Title pt to be I with inital HEP    Time 4    Period Weeks    Status New    Target Date 11/23/20      PT SHORT TERM GOAL #2   Title pt to increase gross  hip strength to 4-/5 for therapuetic progression    Time 4    Period Weeks    Status New    Target Date 11/23/20      PT SHORT TERM GOAL #3   Title increase R shoulder flexion/ abductoin AROM by >/= 8 degress for functional progression    Time 4    Status New    Target Date 11/23/20      PT SHORT TERM GOAL #4   Title perfrom berg balance and 6 min walk test and update LTG's    Time 1    Period Weeks    Status New    Target Date 11/02/20             PT Long Term Goals - 11/17/20 2350      PT LONG TERM GOAL #5   Title increase FOTO score to >/= 61% function to demo improvement in function. 11/17/20: 75%    Status Achieved    Target Date 11/17/20                 Plan - 11/22/20 1540    Clinical Impression Statement Danny Kerr is making good progress with physical therapy. Focused on LE strengthening per pt request. worked on [place] with increaed reps to continue working on endurance training. cues needed for gait and practiced in // which he did well with. end of session he reported having good soreness from exercise.    PT Treatment/Interventions ADLs/Self Care Home Management;Cryotherapy;Iontophoresis 4mg /ml Dexamethasone;Moist Heat;Ultrasound;Gait training;Stair training;Functional mobility training;Therapeutic activities;Therapeutic exercise;Balance training;Neuromuscular re-education;Patient/family education;Manual techniques;Passive range of motion;Dry needling;Taping    PT Next Visit Plan Assess response of L knee to open chain and CKC chain exs without knee bending. endurance training,    PT Home Exercise Plan [id number] - Sit to Stand, Standing Hip Abduction & extension with Counter Support + red tband ,  Supine Hamstring Stretch with Strap, SLR ,Supine flexion with 1 lb, shoulder Internal Rotation,reps  Shoulder External Rotation, Standing Shoulder Row with Anchored Resistance. pt is to hold on Sit to Stand exs           Patient will benefit from skilled  therapeutic intervention in order to improve the following deficits  and impairments:  Improper body mechanics,Increased muscle spasms,Decreased knowledge of precautions,Decreased strength,Postural dysfunction  Visit Diagnosis: Pain in left hip  Muscle weakness (generalized)  Other abnormalities of gait and mobility  Stiffness of right shoulder, not elsewhere classified  Difficulty in walking, not elsewhere classified     Problem List Patient Active Problem List   Diagnosis Date Noted  . Pleural effusion 11/22/2020  . Anxiety 03/23/2020  . Thrombocytopenia ([place])   . Alcohol abuse 10/03/2019  . 

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
## 2020-11-23 ENCOUNTER — Other Ambulatory Visit (HOSPITAL_COMMUNITY)
Admission: RE | Admit: 2020-11-23 | Discharge: 2020-11-23 | Disposition: A | Discharge status: Home / Self Care | Payer: Medicare Other | Source: Ambulatory Visit | Attending: Thoracic Surgery (Cardiothoracic Vascular Surgery) | Admitting: Thoracic Surgery (Cardiothoracic Vascular Surgery)

## 2020-11-23 DIAGNOSIS — Z01812 Encounter for preprocedural laboratory examination: Secondary | ICD-10-CM | POA: Diagnosis present

## 2020-11-23 DIAGNOSIS — Z20822 Contact with and (suspected) exposure to covid-19: Secondary | ICD-10-CM | POA: Insufficient documentation

## 2020-11-23 LAB — SARS CORONAVIRUS 2 (TAT 6-24 HRS): SARS Coronavirus 2: NEGATIVE

## 2020-11-24 ENCOUNTER — Ambulatory Visit: Payer: Medicare Other | Admitting: Physical Therapy

## 2020-11-24 ENCOUNTER — Other Ambulatory Visit: Payer: Self-pay

## 2020-11-24 ENCOUNTER — Ambulatory Visit: Payer: Medicare Other

## 2020-11-24 DIAGNOSIS — R2689 Other abnormalities of gait and mobility: Secondary | ICD-10-CM

## 2020-11-24 DIAGNOSIS — R262 Difficulty in walking, not elsewhere classified: Secondary | ICD-10-CM

## 2020-11-24 DIAGNOSIS — M25611 Stiffness of right shoulder, not elsewhere classified: Secondary | ICD-10-CM

## 2020-11-24 DIAGNOSIS — M25552 Pain in left hip: Secondary | ICD-10-CM | POA: Diagnosis not present

## 2020-11-24 DIAGNOSIS — M6281 Muscle weakness (generalized): Secondary | ICD-10-CM

## 2020-11-25 ENCOUNTER — Other Ambulatory Visit: Payer: Self-pay | Admitting: Thoracic Surgery (Cardiothoracic Vascular Surgery)

## 2020-11-25 ENCOUNTER — Encounter: Payer: Self-pay | Admitting: Student

## 2020-11-25 ENCOUNTER — Ambulatory Visit (HOSPITAL_COMMUNITY)
Admission: RE | Admit: 2020-11-25 | Discharge: 2020-11-25 | Disposition: A | Discharge status: Home / Self Care | Payer: Medicare Other | Source: Ambulatory Visit | Attending: Thoracic Surgery (Cardiothoracic Vascular Surgery) | Admitting: Thoracic Surgery (Cardiothoracic Vascular Surgery)

## 2020-11-25 DIAGNOSIS — J90 Pleural effusion, not elsewhere classified: Secondary | ICD-10-CM | POA: Diagnosis not present

## 2020-11-25 HISTORY — PX: IR THORACENTESIS ASP PLEURAL SPACE W/IMG GUIDE: IMG5380

## 2020-11-25 LAB — GRAM STAIN

## 2020-11-25 LAB — GLUCOSE, PLEURAL OR PERITONEAL FLUID: Glucose, Fluid: 74 mg/dL

## 2020-11-25 LAB — PROTEIN, PLEURAL OR PERITONEAL FLUID: Total protein, fluid: <3 g/dL

## 2020-11-25 MED ORDER — LIDOCAINE HCL 1 % IJ SOLN
INTRAMUSCULAR | Status: AC
  Filled 2020-11-25: qty 20

## 2020-11-25 NOTE — Procedures (Deleted)
PROCEDURE SUMMARY:  Successful image-guided right thoracentesis from right lower chest  Yielded 250 cc of amber hazy fluid.  No immediate complications.  EBL = trace. Patient reported cramping sensation on right ribs at the end of the procedure, but tolerated well.   Specimen sent for labs.  Please see imaging section of Epic for full dictation.   Greg Cratty H Brownie Gockel PA-C 11/25/2020 1:24 PM

## 2020-11-25 NOTE — Procedures (Addendum)
PROCEDURE SUMMARY:  Successful image-guided right thoracentesis from right lower chest  Yielded 250 cc of amber hazy fluid.  No immediate complications.  EBL = trace. Patient reported cramping sensation on right ribs at the end of the procedure, but tolerated well.   Specimen sent for labs.  Full dictation:  INDICATION: History of pneumonia and right plural effusion. Diagnostic and therapeutic right thoracentesis.  EXAM: ULTRASOUND GUIDED DIAGNOSTIC AND THERAPEUTIC RIGHT THORACENTESIS  MEDICATIONS: 1% lidocaine   COMPLICATIONS: None immediate.  PROCEDURE: An ultrasound guided thoracentesis was thoroughly discussed with the patient and questions answered. The benefits, risks, alternatives and complications were also discussed. The patient understands and wishes to proceed with the procedure. Written consent was obtained.  Ultrasound was performed to localize and mark an adequate pocket of fluid in the right chest. The area was then prepped and draped in the normal sterile fashion. 1% Lidocaine was used for local anesthesia. Under ultrasound guidance a 6 Fr Safe-T-Centesis catheter was introduced. Thoracentesis was performed. The catheter was removed and a dressing applied.  FINDINGS: A total of approximately 250 cc of hazy, amber fluid was removed. Samples were sent to the laboratory as requested by the clinical team.  IMPRESSION: Successful ultrasound guided diagnostic and therapeutic right thoracentesis yielding 250 cc of pleural fluid.   Armando Gang Edit Ricciardelli PA-C 11/25/2020 3:11 PM
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
## 2020-11-28 LAB — CYTOLOGY - NON PAP

## 2020-11-28 LAB — PATHOLOGIST SMEAR REVIEW

## 2020-11-29 ENCOUNTER — Ambulatory Visit: Payer: Medicare Other | Admitting: Physical Therapy

## 2020-11-29 ENCOUNTER — Encounter: Payer: Self-pay | Admitting: Physical Therapy

## 2020-11-29 ENCOUNTER — Other Ambulatory Visit: Payer: Self-pay

## 2020-11-29 DIAGNOSIS — R2689 Other abnormalities of gait and mobility: Secondary | ICD-10-CM

## 2020-11-29 DIAGNOSIS — M25552 Pain in left hip: Secondary | ICD-10-CM | POA: Diagnosis not present

## 2020-11-29 DIAGNOSIS — R262 Difficulty in walking, not elsewhere classified: Secondary | ICD-10-CM

## 2020-11-29 DIAGNOSIS — M6281 Muscle weakness (generalized): Secondary | ICD-10-CM

## 2020-11-29 DIAGNOSIS — M25611 Stiffness of right shoulder, not elsewhere classified: Secondary | ICD-10-CM

## 2020-11-29 NOTE — Therapy (Signed)
[place], [place], [address] Phone: [phone]   Fax:  [phone]  Physical Therapy Treatment Progress Note Reporting Period 10/26/2020 to 11/29/2020  See note below for Objective Data and Assessment of Progress/Goals.       Patient Details  Name: ELGIE MAZIARZ MRN: [id number] Date of Birth: 05/16/1956 Referring Provider (PT): Altamese Richton MD   Encounter Date: 11/29/2020   PT End of Session - 11/29/20 1448    Visit Number 10    Number of Visits 17    Date for PT Re-Evaluation 12/21/20    Authorization Type MCR - KX mod at 15th visit    Progress Note Due on Visit 20    PT Start Time 4128    PT Stop Time 1532    PT Time Calculation (min) 43 min    Activity Tolerance Patient tolerated treatment well    Behavior During Therapy [facility] for tasks assessed/performed           Past Medical History:  Diagnosis Date  . Arrhythmia   . Arthritis   . Displaced segmental fracture of shaft of left femur, initial encounter for closed fracture ([place]) 07/16/2020  . ETOH abuse   . Pneumonia   . Rosacea   . Seasonal allergies     Past Surgical History:  Procedure Laterality Date  . COLONOSCOPY    . FEMUR IM NAIL Left 07/16/2020   Procedure: INTRAMEDULLARY (IM) NAIL FEMORAL;  Surgeon: Altamese Nelson, MD;  Location: [place];  Service: Orthopedics;  Laterality: Left;  . IR THORACENTESIS ASP PLEURAL SPACE W/IMG GUIDE  11/25/2020  . KNEE ARTHROTOMY  1977   rt knee  . RADIOLOGY WITH ANESTHESIA N/A 09/03/2017   Procedure: MIR CERVICAL Burt Ek;  Surgeon: Radiologist, Medication, MD;  Location: [place];  Service: Radiology;  Laterality: N/A;  . TOTAL KNEE ARTHROPLASTY Right 09/06/2014   Procedure: RIGHT TOTAL KNEE ARTHROPLASTY;  Surgeon: Gearlean Alf, MD;  Location: [facility];  Service: Orthopedics;  Laterality: Right;    There were no vitals filed for this visit.   Subjective Assessment - 11/29/20 1454     Subjective "I am doing pretty good, no issues. I saw the Md last friday and they pulled 300-500 ml of fluid of my lungs."    Patient Stated Goals increase strenth, reduce atrophy, maintain healthy weight    Currently in Pain? No/denies    Pain Score 0-No pain    Pain Orientation Left              [facility] PT Assessment - 11/29/20 0001      Assessment   Medical Diagnosis L IM nail, bil acetabular fx, R humeral fx    Referring Provider (PT) Altamese Covington MD      AROM   Right Shoulder Flexion 132 Degrees    Right Shoulder ABduction 96 Degrees      Strength   Right Shoulder Flexion 4-/5    Right Shoulder Extension 5/5    Right Shoulder ABduction 4-/5    Right Shoulder Internal Rotation 4/5    Right Shoulder External Rotation 4/5    Left Shoulder Flexion 4-/5    Left Shoulder Extension 5/5    Left Shoulder ABduction 4-/5    Left Shoulder Internal Rotation 4/5    Left Shoulder External Rotation 4/5    Right Hip Flexion 4+/5    Right Hip Extension 4-/5    Right Hip ABduction 4-/5    Right Hip  ADduction 4+/5    Left Hip Extension 4-/5    Left Hip ABduction 4/5    Right Knee Flexion 4+/5    Right Knee Extension 4+/5    Left Knee Flexion 4+/5    Left Knee Extension 4+/5                         [facility] Adult PT Treatment/Exercise - 11/29/20 0001      Knee/Hip Exercises: Aerobic   Nustep L6 x 8 min LE only      Knee/Hip Exercises: Machines for Strengthening   Cybex Knee Extension 2x 20# 25#    Cybex Knee Flexion 2 x 20 25#    Total Gym Leg Press 3 x 15, 1 set 403, 2nd set 60#, 3rd 80#    Hip Cybex bil hip abduction / flexion 2 x 15 12.#      Shoulder Exercises: Standing   Other Standing Exercises wand abduction 2 x 10                  PT Education - 11/29/20 1458    Education Details Reviewed FOTO  assessment. plan of care working on machine and gym specific equipment.    Person(s) Educated Patient    Methods Explanation;Verbal cues;Handout     Comprehension Verbalized understanding;Verbal cues required            PT Short Term Goals - 11/29/20 1509      PT SHORT TERM GOAL #1   Title pt to be I with inital HEP    Status Achieved      PT SHORT TERM GOAL #2   Title pt to increase gross hip strength to 4-/5 for therapuetic progression    Period Weeks    Status Achieved      PT SHORT TERM GOAL #3   Title increase R shoulder flexion/ abductoin AROM by >/= 8 degress for functional progression. Flexion 130d    Period Weeks    Status Achieved      PT SHORT TERM GOAL #4   Title perfrom berg balance and 6 min walk test and update LTG's    Period Weeks    Status Achieved             PT Long Term Goals - 11/29/20 1512      PT LONG TERM GOAL #1   Title pt to increase R shoulder AROM to [facility] compared bil with max of </= 2/10 pain for mobility required for ADLS    Period Weeks    Status On-going      PT LONG TERM GOAL #2   Title pt incrase gross RUE strength to >/= 4+/5 in available ROM to promote functioal strength for ADL's    Period Weeks    Status Partially Met      PT LONG TERM GOAL #3   Title increase bil hip gross strength to >/= 4+/5 to promote hip stability with walking/ standing max pain </= 2/10.    Period Weeks    Status On-going    Target Date 12/21/20      PT LONG TERM GOAL #4   Title pt to be able to return to personal exercise and walking routine with no report of limitations per pts personal goals.    Period Weeks    Status On-going      PT LONG TERM GOAL #5   Title increase FOTO score to >/= 61% function to demo improvement  in function. 11/17/20: 75%    Period Weeks    Status Achieved    Target Date 12/21/20                 Plan - 11/29/20 1521    Clinical Impression Statement pt reports no issues today and feels he is making great progress with PT. He is making good progress with both hip and shoulder mobility and strength. He continues to demo limited R shoulder AROM especially with  abduction, and mild deficits in bil hip strength. He mentions wanting to start going to a gym, focused session on gym specific equipment with increased reps to promote strength and endurance. plan to see pt for remainder of his visits and reassess if more PT is needed vs discharge.    PT Treatment/Interventions ADLs/Self Care Home Management;Cryotherapy;Iontophoresis 81m/ml Dexamethasone;Moist Heat;Ultrasound;Gait training;Stair training;Functional mobility training;Therapeutic activities;Therapeutic exercise;Balance training;Neuromuscular re-education;Patient/family education;Manual techniques;Passive range of motion;Dry needling;Taping    PT Next Visit Plan Progress to [facility] exs as tolerated. Continue strengthening and ROM progressions for the R shoulder    PT Home Exercise Plan [id number]- Sit to Stand, Standing Hip Abduction & extension with Counter Support + red tband ,  Supine Hamstring Stretch with Strap, SLR ,Supine flexion with 1 lb, shoulder Internal Rotation,reps  Shoulder External Rotation, Standing Shoulder Row with Anchored Resistance. pt is to hold on Sit to Stand exs           Patient will benefit from skilled therapeutic intervention in order to improve the following deficits and impairments:  Improper body mechanics,Increased muscle spasms,Decreased knowledge of precautions,Decreased strength,Postural dysfunction  Visit Diagnosis: Pain in left hip  Muscle weakness (generalized)  Other abnormalities of gait and mobility  Stiffness of right shoulder, not elsewhere classified  Difficulty in walking, not elsewhere classified     Problem List Patient Active Problem List   Diagnosis Date Noted  . Pleural effusion 11/22/2020  . Anxiety 03/23/2020  . Thrombocytopenia ([place]   . Alcohol abuse 10/03/2019  . 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

## 2020-11-30 LAB — CULTURE, BODY FLUID W GRAM STAIN -BOTTLE: Culture: NO GROWTH

## 2020-11-30 LAB — BODY FLUID CELL COUNT WITH DIFFERENTIAL
Eos, Fluid: 0 %
Lymphs, Fluid: 100 %
Monocyte-Macrophage-Serous Fluid: 0 % — ABNORMAL LOW (ref 50–90)
Neutrophil Count, Fluid: 0 % (ref 0–25)
Total Nucleated Cell Count, Fluid: 3340 cu mm — ABNORMAL HIGH (ref 0–1000)

## 2020-12-01 ENCOUNTER — Other Ambulatory Visit: Payer: Self-pay

## 2020-12-01 ENCOUNTER — Ambulatory Visit: Payer: Medicare Other | Admitting: Physical Therapy

## 2020-12-01 ENCOUNTER — Encounter: Payer: Self-pay | Admitting: Physical Therapy

## 2020-12-01 DIAGNOSIS — M6281 Muscle weakness (generalized): Secondary | ICD-10-CM

## 2020-12-01 DIAGNOSIS — R2689 Other abnormalities of gait and mobility: Secondary | ICD-10-CM

## 2020-12-01 DIAGNOSIS — R262 Difficulty in walking, not elsewhere classified: Secondary | ICD-10-CM

## 2020-12-01 DIAGNOSIS — M25552 Pain in left hip: Secondary | ICD-10-CM

## 2020-12-01 DIAGNOSIS — M25611 Stiffness of right shoulder, not elsewhere classified: Secondary | ICD-10-CM

## 2020-12-01 NOTE — Therapy (Signed)
[place], [place], [address] Phone: [phone]   Fax:  [phone]  Physical Therapy Treatment  Patient Details  Name: Danny Kerr MRN: [id number] Date of Birth: 1955-11-17 Referring Provider (PT): Altamese Cologne MD   Encounter Date: 12/01/2020   PT End of Session - 12/01/20 1457    Visit Number 11    Number of Visits 17    Date for PT Re-Evaluation 12/21/20    Authorization Type MCR - KX mod at 15th visit    Progress Note Due on Visit 20    PT Start Time 1458    PT Stop Time 1538    PT Time Calculation (min) 40 min    Activity Tolerance Patient tolerated treatment well    Behavior During Therapy [facility] for tasks assessed/performed           Past Medical History:  Diagnosis Date  . Arrhythmia   . Arthritis   . Displaced segmental fracture of shaft of left femur, initial encounter for closed fracture ([place]) 07/16/2020  . ETOH abuse   . Pneumonia   . Rosacea   . Seasonal allergies     Past Surgical History:  Procedure Laterality Date  . COLONOSCOPY    . FEMUR IM NAIL Left 07/16/2020   Procedure: INTRAMEDULLARY (IM) NAIL FEMORAL;  Surgeon: Altamese Benton, MD;  Location: [place];  Service: Orthopedics;  Laterality: Left;  . IR THORACENTESIS ASP PLEURAL SPACE W/IMG GUIDE  11/25/2020  . KNEE ARTHROTOMY  1977   rt knee  . RADIOLOGY WITH ANESTHESIA N/A 09/03/2017   Procedure: MIR CERVICAL Burt Ek;  Surgeon: Radiologist, Medication, MD;  Location: [place];  Service: Radiology;  Laterality: N/A;  . TOTAL KNEE ARTHROPLASTY Right 09/06/2014   Procedure: RIGHT TOTAL KNEE ARTHROPLASTY;  Surgeon: Gearlean Alf, MD;  [facility];  Service: Orthopedics;  Laterality: Right;    There were no vitals filed for this visit.   Subjective Assessment - 12/01/20 1458    Subjective " I am having no pain but I do feel some weakness still."    Patient Stated Goals increase strenth, reduce atrophy,  maintain healthy weight    Currently in Pain? No/denies              [facility] PT Assessment - 12/01/20 0001      Assessment   Medical Diagnosis L IM nail, bil acetabular fx, R humeral fx    Referring Provider (PT) Altamese Kingman MD                         [facility] Adult PT Treatment/Exercise - 12/01/20 0001      Knee/Hip Exercises: Aerobic   Tread Mill 2.0 x 2 min, 2.5 x 67mn, 3.0 x 2 min with bil HH  0 ramp      Knee/Hip Exercises: Machines for Strengthening   Cybex Knee Extension 3 x 15 25#    Cybex Knee Flexion 3 x 15 25#    Total Gym Leg Press 1 x 15 45 #, 1 x 15 55#, 1 x 15 65#      Knee/Hip Exercises: Standing   Lateral Step Up --    Forward Step Up --      Knee/Hip Exercises: Seated   Sit to Sand 2 sets;15 reps   1 set unweight, 1 set ith 10# kettlebell     Knee/Hip Exercises: Sidelying   Hip ABduction 2 sets;Both;Strengthening;20 reps  Shoulder Exercises: Supine   Other Supine Exercises wand flexion 1 x 20 bil AAROM      Shoulder Exercises: Standing   Other Standing Exercises wand abduction 1 x 20 AAROM               Balance Exercises - 12/01/20 0001      Balance Exercises: Standing   Rockerboard Anterior/posterior   soft tap 2 x 20   Other Standing Exercises Soft rocker board, balance,  3 x 30               PT Short Term Goals - 11/29/20 1509      PT SHORT TERM GOAL #1   Title pt to be I with inital HEP    Status Achieved      PT SHORT TERM GOAL #2   Title pt to increase gross hip strength to 4-/5 for therapuetic progression    Period Weeks    Status Achieved      PT SHORT TERM GOAL #3   Title increase R shoulder flexion/ abductoin AROM by >/= 8 degress for functional progression. Flexion 130d    Period Weeks    Status Achieved      PT SHORT TERM GOAL #4   Title perfrom berg balance and 6 min walk test and update LTG's    Period Weeks    Status Achieved             PT Long Term Goals - 11/29/20 1512      PT  LONG TERM GOAL #1   Title pt to increase R shoulder AROM to [facility] compared bil with max of </= 2/10 pain for mobility required for ADLS    Period Weeks    Status On-going      PT LONG TERM GOAL #2   Title pt incrase gross RUE strength to >/= 4+/5 in available ROM to promote functioal strength for ADL's    Period Weeks    Status Partially Met      PT LONG TERM GOAL #3   Title increase bil hip gross strength to >/= 4+/5 to promote hip stability with walking/ standing max pain </= 2/10.    Period Weeks    Status On-going    Target Date 12/21/20      PT LONG TERM GOAL #4   Title pt to be able to return to personal exercise and walking routine with no report of limitations per pts personal goals.    Period Weeks    Status On-going      PT LONG TERM GOAL #5   Title increase FOTO score to >/= 61% function to demo improvement in function. 11/17/20: 75%    Period Weeks    Status Achieved    Target Date 12/21/20                 Plan - 12/01/20 1511    Clinical Impression Statement Danny Kerr continues to report no pain and notes only intermittent weakness. continued working functional endurance and gross hip/ knee strengthening utilzing gym specific equipment to assist with his potential transfer to home exercises/ gym exercises.    PT Treatment/Interventions ADLs/Self Care Home Management;Cryotherapy;Iontophoresis 27m/ml Dexamethasone;Moist Heat;Ultrasound;Gait training;Stair training;Functional mobility training;Therapeutic activities;Therapeutic exercise;Balance training;Neuromuscular re-education;Patient/family education;Manual techniques;Passive range of motion;Dry needling;Taping    PT Next Visit Plan Progress to CKC exs as tolerated. Continue strengthening and ROM progressions for the R shoulder    PT Home Exercise Plan [id number]- Sit to Stand, Standing Hip  Abduction & extension with Counter Support + red tband ,  Supine Hamstring Stretch with Strap, SLR ,Supine flexion with 1 lb,  shoulder Internal Rotation,reps  Shoulder External Rotation, Standing Shoulder Row with Anchored Resistance. pt is to hold on Sit to Stand exs           Patient will benefit from skilled therapeutic intervention in order to improve the following deficits and impairments:  Improper body mechanics,Increased muscle spasms,Decreased knowledge of precautions,Decreased strength,Postural dysfunction  Visit Diagnosis: Pain in left hip  Muscle weakness (generalized)  Other abnormalities of gait and mobility  Stiffness of right shoulder, not elsewhere classified  Difficulty in walking, not elsewhere classified     Problem List Patient Active Problem List   Diagnosis Date Noted  . Pleural effusion 11/22/2020  . Anxiety 03/23/2020  . Thrombocytopenia ([place])   . Alcohol abuse 10/03/2019  . 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

## 2020-12-06 ENCOUNTER — Other Ambulatory Visit: Payer: Self-pay

## 2020-12-06 ENCOUNTER — Encounter: Payer: Self-pay | Admitting: Family Medicine

## 2020-12-06 ENCOUNTER — Ambulatory Visit (INDEPENDENT_AMBULATORY_CARE_PROVIDER_SITE_OTHER): Payer: Medicare Other | Admitting: Family Medicine

## 2020-12-06 ENCOUNTER — Ambulatory Visit: Payer: Medicare Other

## 2020-12-06 VITALS — BP 134/81 | HR 86 | Temp 98.0°F | Ht 75.0 in | Wt 180.0 lb

## 2020-12-06 DIAGNOSIS — R739 Hyperglycemia, unspecified: Secondary | ICD-10-CM

## 2020-12-06 DIAGNOSIS — Z0000 Encounter for general adult medical examination without abnormal findings: Secondary | ICD-10-CM | POA: Diagnosis not present

## 2020-12-06 DIAGNOSIS — F1010 Alcohol abuse, uncomplicated: Secondary | ICD-10-CM

## 2020-12-06 DIAGNOSIS — R262 Difficulty in walking, not elsewhere classified: Secondary | ICD-10-CM

## 2020-12-06 DIAGNOSIS — D696 Thrombocytopenia, unspecified: Secondary | ICD-10-CM | POA: Diagnosis not present

## 2020-12-06 DIAGNOSIS — R351 Nocturia: Secondary | ICD-10-CM

## 2020-12-06 DIAGNOSIS — M6281 Muscle weakness (generalized): Secondary | ICD-10-CM

## 2020-12-06 DIAGNOSIS — M25552 Pain in left hip: Secondary | ICD-10-CM

## 2020-12-06 DIAGNOSIS — M25611 Stiffness of right shoulder, not elsewhere classified: Secondary | ICD-10-CM

## 2020-12-06 DIAGNOSIS — J90 Pleural effusion, not elsewhere classified: Secondary | ICD-10-CM | POA: Diagnosis not present

## 2020-12-06 DIAGNOSIS — F419 Anxiety disorder, unspecified: Secondary | ICD-10-CM | POA: Diagnosis not present

## 2020-12-06 DIAGNOSIS — R2689 Other abnormalities of gait and mobility: Secondary | ICD-10-CM

## 2020-12-06 DIAGNOSIS — Z13220 Encounter for screening for lipoid disorders: Secondary | ICD-10-CM | POA: Diagnosis not present

## 2020-12-06 NOTE — Patient Instructions (Signed)
It was very nice to see you today!  We will check blood work today.  I will see back in year.  Come back to see me sooner if needed.  Take care, Dr Jerline Pain  Please try these tips to maintain a healthy lifestyle:   Eat at least 3 REAL meals and 1-2 snacks per day.  Aim for no more than 5 hours between eating.  If you eat breakfast, please do so within one hour of getting up.    Each meal should contain half fruits/vegetables, one quarter protein, and one quarter carbs (no bigger than a computer mouse)   Cut down on sweet beverages. This includes juice, soda, and sweet tea.     Drink at least 1 glass of water with each meal and aim for at least 8 glasses per day   Exercise at least 150 minutes every week.    Preventive Care 66 Years and Older, Male Preventive care refers to lifestyle choices and visits with your health care provider that can promote health and wellness. This includes:  A yearly physical exam. This is also called an annual wellness visit.  Regular dental and eye exams.  Immunizations.  Screening for certain conditions.  Healthy lifestyle choices, such as: ? Eating a healthy diet. ? Getting regular exercise. ? Not using drugs or products that contain nicotine and tobacco. ? Limiting alcohol use. What can I expect for my preventive care visit? Physical exam Your health care provider will check your:  Height and weight. These may be used to calculate your BMI (body mass index). BMI is a measurement that tells if you are at a healthy weight.  Heart rate and blood pressure.  Body temperature.  Skin for abnormal spots. Counseling Your health care provider may ask you questions about your:  Past medical problems.  Family's medical history.  Alcohol, tobacco, and drug use.  Emotional well-being.  Home life and relationship well-being.  Sexual activity.  Diet, exercise, and sleep habits.  History of falls.  Memory and ability to understand  (cognition).  Work and work Statistician.  Access to firearms. What immunizations do I need? Vaccines are usually given at various ages, according to a schedule. Your health care provider will recommend vaccines for you based on your age, medical history, and lifestyle or other factors, such as travel or where you work.   What tests do I need? Blood tests  Lipid and cholesterol levels. These may be checked every 5 years, or more often depending on your overall health.  Hepatitis C test.  Hepatitis B test. Screening  Lung cancer screening. You may have this screening every year starting at age 70 if you have a 30-pack-year history of smoking and currently smoke or have quit within the past 15 years.  Colorectal cancer screening. ? All adults should have this screening starting at age 74 and continuing until age 55. ? Your health care provider may recommend screening at age 63 if you are at increased risk. ? You will have tests every 1-10 years, depending on your results and the type of screening test.  Prostate cancer screening. Recommendations will vary depending on your family history and other risks.  Genital exam to check for testicular cancer or hernias.  Diabetes screening. ? This is done by checking your blood sugar (glucose) after you have not eaten for a while (fasting). ? You may have this done every 1-3 years.  Abdominal aortic aneurysm (AAA) screening. You may need this if you  are a current or former smoker.  STD (sexually transmitted disease) testing, if you are at risk. Follow these instructions at home: Eating and drinking  Eat a diet that includes fresh fruits and vegetables, whole grains, lean protein, and low-fat dairy products. Limit your intake of foods with high amounts of sugar, saturated fats, and salt.  Take vitamin and mineral supplements as recommended by your health care provider.  Do not drink alcohol if your health care provider tells you not to  drink.  If you drink alcohol: ? Limit how much you have to 0-2 drinks a day. ? Be aware of how much alcohol is in your drink. In the [place] one drink equals one 12 oz bottle of beer (355 mL), one 5 oz glass of wine (148 mL), or one 1 oz glass of hard liquor (44 mL).   Lifestyle  Take daily care of your teeth and gums. Brush your teeth every morning and night with fluoride toothpaste. Floss one time each day.  Stay active. Exercise for at least 30 minutes 5 or more days each week.  Do not use any products that contain nicotine or tobacco, such as cigarettes, e-cigarettes, and chewing tobacco. If you need help quitting, ask your health care provider.  Do not use drugs.  If you are sexually active, practice safe sex. Use a condom or other form of protection to prevent STIs (sexually transmitted infections).  Talk with your health care provider about taking a low-dose aspirin or statin.  Find healthy ways to cope with stress, such as: ? Meditation, yoga, or listening to music. ? Journaling. ? Talking to a trusted person. ? Spending time with friends and family. Safety  Always wear your seat belt while driving or riding in a vehicle.  Do not drive: ? If you have been drinking alcohol. Do not ride with someone who has been drinking. ? When you are tired or distracted. ? While texting.  Wear a helmet and other protective equipment during sports activities.  If you have firearms in your house, make sure you follow all gun safety procedures. What's next?  Visit your health care provider once a year for an annual wellness visit.  Ask your health care provider how often you should have your eyes and teeth checked.  Stay up to date on all vaccines. This information is not intended to replace advice given to you by your health care provider. Make sure you discuss any questions you have with your health care provider. Document Revised: 07/07/2019 Document Reviewed: 10/02/2018 Elsevier  Patient Education  2021 [employer].

## 2020-12-06 NOTE — Assessment & Plan Note (Signed)
Recheck labs 
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
## 2020-12-06 NOTE — Assessment & Plan Note (Signed)
Managed by cardiothoracic surgery.

## 2020-12-06 NOTE — Progress Notes (Signed)
Chief Complaint:  Danny Kerr is a 65 y.o. male who presents today for a Medicare Initial Preventive Examination.  Assessment/Plan:  Chronic Problems Addressed Today: Pleural effusion Managed by cardiothoracic surgery.   Anxiety Stable off zoloft.   Thrombocytopenia ([place]) Recheck labs.   Alcohol abuse Continue cessation. Has been following with his therapist.   Hyperglycemia Check A1c.   Nocturia Check PSA.  Preventative Healthcare Health Maintenance  Topic Date Due  . Hepatitis C Screening  Never done  . INFLUENZA VACCINE  01/19/2021 (Originally 05/22/2020)  . TETANUS/TDAP  12/06/2021 (Originally 06/26/1974)  . PNA vac Low Risk Adult (1 of 2 - PCV13) 12/06/2021 (Originally 06/26/2020)  . COLONOSCOPY (Pts 45-52yrs Insurance coverage will need to be confirmed)  11/22/2024  . COVID-19 Vaccine  Completed  . HIV Screening  Completed    Patient Counseling(The following topics were reviewed and/or handout was given):  -Nutrition: Stressed importance of moderation in sodium/caffeine intake, saturated fat and cholesterol, caloric balance, sufficient intake of fresh fruits, vegetables, and fiber.  -Stressed the importance of regular exercise.   -Substance Abuse: Discussed cessation/primary prevention of tobacco, alcohol, or other drug use; driving or other dangerous activities under the influence; availability of treatment for abuse.   -Injury prevention: Discussed safety belts, safety helmets, smoke detector, smoking near bedding or upholstery.   -Sexuality: Discussed sexually transmitted diseases, partner selection, use of condoms, avoidance of unintended pregnancy and contraceptive alternatives.   -Dental health: Discussed importance of regular tooth brushing, flossing, and dental visits.  -Health maintenance and immunizations reviewed. Please refer to Health maintenance section.  Advance Directives were discussed with the patient.   Patient Instructions (the written  plan) was given to the patient.  Medicare Attestation I have personally reviewed: The patient's medical and social history Their use of alcohol, tobacco or illicit drugs Their current medications and supplements The patient's functional ability including ADLs,fall risks, home safety risks, cognitive, and hearing and visual impairment Diet and physical activities Evidence for depression or mood disorders His use of opioid medications, reviewed Opioid use disorder risk factors, evaluated pain severity and current treatment plan, and provided information on non-opioid treatment options - He is not on narcotics.    The patient's weight, height, BMI, and visual acuity have been recorded in the chart.  I have made referrals, counseling, and provided education to the patient based on review of the above and I have provided the patient with a written personalized care plan for preventive services.    During the course of the visit the patient was educated and counseled about appropriate screening and preventive services including:        Colorectal cancer screening Diabetes screening Nutrition counseling  Advanced directives: has an advanced directive - a copy has been provided    Subjective:  HPI:  Activities of Daily Living: He has no difficulty performing the following: . Preparing food and eating . Bathing  . Getting dressed . Using the toilet . Shopping . Managing Finances . Moving around from place to place  Fall Screening: He has not had any falls within the past year.   Hearing Screening: Patient has no concerns over hearing.  Safety Screening Patient feels safe at home.  There are no smokers at home.   Depression Screening: Depression screen [facility] 2/9 10/11/2020  Decreased Interest 0  Down, Depressed, Hopeless 0  PHQ - 2 Score 0  Some encounter information is confidential and restricted. Go to Review Flowsheets activity to see all data.  Lifestyle Factors: Diet:  Balanced.  Exercise: Working with physical therapy.    Patient Care Team: [name], MD as PCP - General (Family Medicine) O'Neal, Cassie Freer, MD as PCP - Cardiology (Internal Medicine) [name], MD (Family Medicine)   ROS: Per HPI  PMH:  The following were reviewed and entered/updated in epic: Past Medical History:  Diagnosis Date  . Arrhythmia   . Arthritis   . Displaced segmental fracture of shaft of left femur, initial encounter for closed fracture ([place]) 07/16/2020  . ETOH abuse   . Pneumonia   . Rosacea   . Seasonal allergies    Past Surgical History:  Procedure Laterality Date  . COLONOSCOPY    . FEMUR IM NAIL Left 07/16/2020   Procedure: INTRAMEDULLARY (IM) NAIL FEMORAL;  Surgeon: Altamese [place], MD;  Location: [place];  Service: Orthopedics;  Laterality: Left;  . IR THORACENTESIS ASP PLEURAL SPACE W/IMG GUIDE  11/25/2020  . KNEE ARTHROTOMY  1977   rt knee  . RADIOLOGY WITH ANESTHESIA N/A 09/03/2017   Procedure: MIR CERVICAL Burt Ek;  Surgeon: Radiologist, Medication, MD;  Location: [place];  Service: Radiology;  Laterality: N/A;  . TOTAL KNEE ARTHROPLASTY Right 09/06/2014   Procedure: RIGHT TOTAL KNEE ARTHROPLASTY;  Surgeon: Gearlean Alf, MD;  [facility];  Service: Orthopedics;  Laterality: Right;   Family History  Problem Relation Age of Onset  . Lung cancer Mother   . Cancer Mother   . Parkinson's disease Sister   . Heart disease Sister     Medications- reviewed and updated Current Outpatient Medications  Medication Sig Dispense Refill  . acetaminophen (TYLENOL) 500 MG tablet Take 2 tablets (1,000 mg total) by mouth every 6 (six) hours as needed. 30 tablet 0  . folic acid (FOLVITE) 1 MG tablet Take by mouth.    . predniSONE (DELTASONE) 10 MG tablet Take 1 tablet (10 mg total) by mouth daily with breakfast. (Patient not taking: Reported on 12/06/2020) 21 tablet 0   No current facility-administered medications for this visit.     Allergies-reviewed and updated No Known Allergies  Social History   Socioeconomic History  . Marital status: Legally Separated    Spouse name: Not on file  . Number of children: Not on file  . Years of education: Not on file  . Highest education level: Not on file  Occupational History  . Occupation: retired  Tobacco Use  . Smoking status: Former Smoker    Years: 5.00    Quit date: 08/26/1989    Years since quitting: 31.3  . Smokeless tobacco: Never Used  Vaping Use  . Vaping Use: Never used  Substance and Sexual Activity  . Alcohol use: Not Currently    Comment: heavy daily alcohol use  . Drug use: No  . Sexual activity: Not on file  Other Topics Concern  . Not on file  Social History Narrative   ** Merged History Encounter **       Social Determinants of Health   Financial Resource Strain: Not on file  Food Insecurity: Not on file  Transportation Needs: Not on file  Physical Activity: Not on file  Stress: Not on file  Social Connections: Not on file         Objective/Observations  Physical Exam: BP 134/81   Pulse 86   Temp 98 F (36.7 C) (Temporal)   Ht 6\' 3"  (1.905 m)   Wt 180 lb (81.6 kg)   SpO2 98%  BMI 22.50 kg/m  No flowsheet data found.  Hearing Screening   Method: Audiometry   125Hz  250Hz  500Hz  1000Hz  2000Hz  3000Hz  4000Hz  6000Hz  8000Hz   Right ear:           Left ear:           Comments: Pass bilateral    Visual Acuity Screening   Right eye Left eye Both eyes  Without correction:     With correction: 20/20 20/50 20/20    Gen: NAD, resting comfortably CV: RRR with no murmurs appreciated Pulm: NWOB, CTAB with no crackles, wheezes, or rhonchi GI: Normal bowel sounds present. Soft, Nontender, Nondistended. MSK: no edema, cyanosis, or clubbing noted Skin: warm, dry Neuro: grossly normal, moves all extremities Psych: Normal affect and thought content. Normal minicog.       Algis Greenhouse. Jerline Pain, MD 12/06/2020 3:32 PM

## 2020-12-06 NOTE — Assessment & Plan Note (Signed)
Stable off zoloft.

## 2020-12-06 NOTE — Assessment & Plan Note (Signed)
Check A1c. 

## 2020-12-06 NOTE — Assessment & Plan Note (Signed)
Continue cessation. Has been following with his therapist.

## 2020-12-07 LAB — TSH: TSH: 0.55 u[IU]/mL (ref 0.35–4.50)

## 2020-12-07 LAB — CBC
HCT: 41 % (ref 39.0–52.0)
Hemoglobin: 13.8 g/dL (ref 13.0–17.0)
MCHC: 33.7 g/dL (ref 30.0–36.0)
MCV: 85.6 fl (ref 78.0–100.0)
Platelets: 336 10*3/uL (ref 150.0–400.0)
RBC: 4.78 Mil/uL (ref 4.22–5.81)
RDW: 15.3 % (ref 11.5–15.5)
WBC: 9.5 10*3/uL (ref 4.0–10.5)

## 2020-12-07 LAB — LIPID PANEL
Cholesterol: 167 mg/dL (ref 0–200)
HDL: 51 mg/dL (ref >39.00)
LDL Cholesterol: 102 mg/dL — ABNORMAL HIGH (ref 0–99)
NonHDL: 115.97
Total CHOL/HDL Ratio: 3
Triglycerides: 69 mg/dL (ref 0.0–149.0)
VLDL: 13.8 mg/dL (ref 0.0–40.0)

## 2020-12-07 LAB — COMPREHENSIVE METABOLIC PANEL
ALT: 2 U/L (ref 0–53)
AST: 9 U/L (ref 0–37)
Albumin: 4 g/dL (ref 3.5–5.2)
Alkaline Phosphatase: 128 U/L — ABNORMAL HIGH (ref 39–117)
BUN: 8 mg/dL (ref 6–23)
CO2: 29 mEq/L (ref 19–32)
Calcium: 9.6 mg/dL (ref 8.4–10.5)
Chloride: 100 mEq/L (ref 96–112)
Creatinine, Ser: 0.61 mg/dL (ref 0.40–1.50)
GFR: 100.84 mL/min (ref >60.00)
Glucose, Bld: 103 mg/dL — ABNORMAL HIGH (ref 70–99)
Potassium: 4.3 mEq/L (ref 3.5–5.1)
Sodium: 138 mEq/L (ref 135–145)
Total Bilirubin: 0.5 mg/dL (ref 0.2–1.2)
Total Protein: 7.1 g/dL (ref 6.0–8.3)

## 2020-12-07 LAB — HEMOGLOBIN A1C: Hgb A1c MFr Bld: 5.4 % (ref 4.6–6.5)

## 2020-12-07 LAB — PSA: PSA: 0.64 ng/mL (ref 0.10–4.00)

## 2020-12-08 ENCOUNTER — Ambulatory Visit: Payer: Medicare Other | Admitting: Physical Therapy

## 2020-12-08 NOTE — Progress Notes (Signed)
Please inform patient of the following:  Labs are all stable. Do not need to make any changes to his treatment plan at this time. Would like for him to keep up the good work and we can recheck in a year or so.  Danny Kerr. Jerline Pain, MD 12/08/2020 11:35 AM

## 2020-12-09 ENCOUNTER — Other Ambulatory Visit: Payer: Self-pay

## 2020-12-09 ENCOUNTER — Ambulatory Visit: Payer: Medicare Other

## 2020-12-09 ENCOUNTER — Ambulatory Visit
Admission: RE | Admit: 2020-12-09 | Discharge: 2020-12-09 | Disposition: A | Discharge status: Home / Self Care | Payer: Medicare Other | Source: Ambulatory Visit | Attending: Thoracic Surgery (Cardiothoracic Vascular Surgery) | Admitting: Thoracic Surgery (Cardiothoracic Vascular Surgery)

## 2020-12-09 DIAGNOSIS — M25552 Pain in left hip: Secondary | ICD-10-CM

## 2020-12-09 DIAGNOSIS — M25611 Stiffness of right shoulder, not elsewhere classified: Secondary | ICD-10-CM

## 2020-12-09 DIAGNOSIS — J90 Pleural effusion, not elsewhere classified: Secondary | ICD-10-CM

## 2020-12-09 DIAGNOSIS — R2689 Other abnormalities of gait and mobility: Secondary | ICD-10-CM

## 2020-12-09 DIAGNOSIS — M6281 Muscle weakness (generalized): Secondary | ICD-10-CM

## 2020-12-09 DIAGNOSIS — R262 Difficulty in walking, not elsewhere classified: Secondary | ICD-10-CM
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
## 2020-12-13 ENCOUNTER — Ambulatory Visit: Payer: Medicare Other | Admitting: Physical Therapy

## 2020-12-13 ENCOUNTER — Encounter: Payer: Self-pay | Admitting: Physical Therapy

## 2020-12-13 ENCOUNTER — Encounter: Payer: Self-pay | Admitting: Thoracic Surgery (Cardiothoracic Vascular Surgery)

## 2020-12-13 ENCOUNTER — Other Ambulatory Visit: Payer: Self-pay

## 2020-12-13 ENCOUNTER — Ambulatory Visit (INDEPENDENT_AMBULATORY_CARE_PROVIDER_SITE_OTHER): Payer: Medicare Other | Admitting: Thoracic Surgery (Cardiothoracic Vascular Surgery)

## 2020-12-13 VITALS — BP 152/75 | HR 81 | Resp 20 | Ht 75.0 in | Wt 180.0 lb

## 2020-12-13 DIAGNOSIS — M25552 Pain in left hip: Secondary | ICD-10-CM

## 2020-12-13 DIAGNOSIS — J90 Pleural effusion, not elsewhere classified: Secondary | ICD-10-CM | POA: Diagnosis not present

## 2020-12-13 DIAGNOSIS — R262 Difficulty in walking, not elsewhere classified: Secondary | ICD-10-CM

## 2020-12-13 DIAGNOSIS — M6281 Muscle weakness (generalized): Secondary | ICD-10-CM

## 2020-12-13 DIAGNOSIS — R2689 Other abnormalities of gait and mobility: Secondary | ICD-10-CM

## 2020-12-13 DIAGNOSIS — M25611 Stiffness of right shoulder, not elsewhere classified: Secondary | ICD-10-CM

## 2020-12-13 NOTE — Therapy (Signed)
[place], [place], [address] Phone: [phone]   Fax:  [phone]  Physical Therapy Treatment  Patient Details  Name: Danny [place] MRN: [id number] Date of Birth: 1955/11/03 Referring Provider (PT): Altamese Falun MD   Encounter Date: 12/13/2020   PT End of Session - 12/13/20 1149    Visit Number 14    Number of Visits 17    Date for PT Re-Evaluation 12/21/20    Authorization Type MCR - KX mod at 15th visit    PT Start Time 1112   pt was checked in at 11:02 and PT called him for his appointment at 11:03, he acknowledged being called kept texting on his phone until 11:12.   PT Stop Time 1145    PT Time Calculation (min) 33 min    Activity Tolerance Patient tolerated treatment well    Behavior During Therapy WFL for tasks assessed/performed           Past Medical History:  Diagnosis Date  . Arrhythmia   . Arthritis   . Displaced segmental fracture of shaft of left femur, initial encounter for closed fracture ([place]) 07/16/2020  . ETOH abuse   . Pneumonia   . Rosacea   . Seasonal allergies     Past Surgical History:  Procedure Laterality Date  . COLONOSCOPY    . FEMUR IM NAIL Left 07/16/2020   Procedure: INTRAMEDULLARY (IM) NAIL FEMORAL;  Surgeon: Altamese [place], MD;  Location: [facility];  Service: Orthopedics;  Laterality: Left;  . IR THORACENTESIS ASP PLEURAL SPACE W/IMG GUIDE  11/25/2020  . KNEE ARTHROTOMY  1977   rt knee  . RADIOLOGY WITH ANESTHESIA N/A 09/03/2017   Procedure: MIR CERVICAL Burt Ek;  Surgeon: Radiologist, Medication, MD;  Location: [facility];  Service: Radiology;  Laterality: N/A;  . TOTAL KNEE ARTHROPLASTY Right 09/06/2014   Procedure: RIGHT TOTAL KNEE ARTHROPLASTY;  Surgeon: Gearlean Alf, MD;  Location: [facility];  Service: Orthopedics;  Laterality: Right;    There were no vitals filed for this visit.                      [facility] PT  Treatment/Exercise - 12/13/20 0001      Knee/Hip Exercises: Aerobic   Tread Mill 2.0 x 2 min, 2.5 x 7mn, 3.0 x 2 min with bil HH  0 ramp      Knee/Hip Exercises: Machines for Strengthening   Cybex Knee Extension 2 x 20 35#    Cybex Knee Flexion 2 x 20 35#    Total Gym Leg Press 1 x 20 55#, 1 x 20 66#      Knee/Hip Exercises: Standing   Hip Abduction Stengthening;2 sets;Knee straight   with green therdaband   Lateral Step Up 2 sets;10 reps;Both   lateral steppig on to bosu   Forward Step Up 2 sets;10 reps   onto Bosu     Shoulder Exercises: Standing   External Rotation Strengthening;Left;Theraband;20 reps   x 2 sets   Theraband Level (Shoulder External Rotation) Level 3 (Green)    Internal Rotation Strengthening;Left;20 reps;Theraband    Theraband Level (Shoulder Internal Rotation) Level 3 (Green)    Extension Strengthening;Both;20 reps;Theraband    Theraband Level (Shoulder Extension) Level 3 (Green)    Row SApache CorporationTheraband    Theraband Level (Shoulder Row) Level 3 (Green)    Other Standing Exercises reaching in to cabinet middle shelf 1 x 10 2#, progressed to  2# reaching to middle shelf, and 2 x 10 reaching to top shelf               Balance Exercises - 12/13/20 0001      Balance Exercises: Standing   SLS Eyes open;2 reps;30 secs   bil, then progressed to 2 x 30 bil on airex pad              PT Short Term Goals - 11/29/20 1509      PT SHORT TERM GOAL #1   Title pt to be I with inital HEP    Status Achieved      PT SHORT TERM GOAL #2   Title pt to increase gross hip strength to 4-/5 for therapuetic progression    Period Weeks    Status Achieved      PT SHORT TERM GOAL #3   Title increase R shoulder flexion/ abductoin AROM by >/= 8 degress for functional progression. Flexion 130d    Period Weeks    Status Achieved      PT SHORT TERM GOAL #4   Title perfrom berg balance and 6 min walk test and update LTG's    Period Weeks    Status  Achieved             PT Long Term Goals - 11/29/20 1512      PT LONG TERM GOAL #1   Title pt to increase R shoulder AROM to [facility] compared bil with max of </= 2/10 pain for mobility required for ADLS    Period Weeks    Status On-going      PT LONG TERM GOAL #2   Title pt incrase gross RUE strength to >/= 4+/5 in available ROM to promote functioal strength for ADL's    Period Weeks    Status Partially Met      PT LONG TERM GOAL #3   Title increase bil hip gross strength to >/= 4+/5 to promote hip stability with walking/ standing max pain </= 2/10.    Period Weeks    Status On-going    Target Date 12/21/20      PT LONG TERM GOAL #4   Title pt to be able to return to personal exercise and walking routine with no report of limitations per pts personal goals.    Period Weeks    Status On-going      PT LONG TERM GOAL #5   Title increase FOTO score to >/= 61% function to demo improvement in function. 11/17/20: 75%    Period Weeks    Status Achieved    Target Date 12/21/20                 Plan - 12/13/20 1151    Clinical Impression Statement limited session due today secondary to pt texting on his phone before coming back to gym. Discussed cellphone policy and it taking time for him to come back takes away from his session, pt reported being lectured about the cellphone policy and that he arrives early every time before his appointment. I reported we appreciate him coming early but cannot get him before his scheduled appontment time. He replied that he is helping the clinic out because the clinic called and scheduled him today in a open slot. He stated his sessions aren't the full time which I stated we keep him for 40-45 min every session, to which he replied that "I needed to shut up if I knew what was good  for me". multiple therapist present during interaction. PT descalated the situation and focused on gait treadmill training gradually increasing speed. continued working on  shoulder strength and functional strength and reaching overhead with RUE. Continued working on LE strengthening increasing reps and resistance which he did well with but did fatigue quickly with standing hip abduction. end of session pt noted no pain and left abruptly.    PT Treatment/Interventions ADLs/Self Care Home Management;Cryotherapy;Iontophoresis 85m/ml Dexamethasone;Moist Heat;Ultrasound;Gait training;Stair training;Functional mobility training;Therapeutic activities;Therapeutic exercise;Balance training;Neuromuscular re-education;Patient/family education;Manual techniques;Passive range of motion;Dry needling;Taping    PT Next Visit Plan Assess SL stance times. Complete balance and ankle strengthening exs. Progress to CKC exs as tolerated. Continue strengthening and ROM progressions for the R shoulder    Consulted and Agree with Plan of Care Patient           Patient will benefit from skilled therapeutic intervention in order to improve the following deficits and impairments:  Improper body mechanics,Increased muscle spasms,Decreased knowledge of precautions,Decreased strength,Postural dysfunction  Visit Diagnosis: Pain in left hip  Muscle weakness (generalized)  Other abnormalities of gait and mobility  Stiffness of right shoulder, not elsewhere classified  Difficulty in walking, not elsewhere classified     Problem List Patient Active Problem List   Diagnosis Date Noted  . Hyperglycemia 12/06/2020  . Pleural effusion 11/22/2020  . Anxiety 03/23/2020  . [place]   . Alcohol abuse 10/03/2019  . 

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
## 2020-12-15 ENCOUNTER — Other Ambulatory Visit: Payer: Self-pay

## 2020-12-15 ENCOUNTER — Ambulatory Visit: Payer: Medicare Other

## 2020-12-15 DIAGNOSIS — M25552 Pain in left hip: Secondary | ICD-10-CM

## 2020-12-15 DIAGNOSIS — M25611 Stiffness of right shoulder, not elsewhere classified: Secondary | ICD-10-CM

## 2020-12-15 DIAGNOSIS — R262 Difficulty in walking, not elsewhere classified: Secondary | ICD-10-CM

## 2020-12-15 DIAGNOSIS — R2689 Other abnormalities of gait and mobility: Secondary | ICD-10-CM

## 2020-12-15 DIAGNOSIS — M6281 Muscle weakness (generalized): Secondary | ICD-10-CM
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
## 2020-12-27 ENCOUNTER — Ambulatory Visit: Payer: Medicare Other | Attending: Orthopedic Surgery

## 2020-12-27 ENCOUNTER — Other Ambulatory Visit: Payer: Self-pay

## 2020-12-27 DIAGNOSIS — M25511 Pain in right shoulder: Secondary | ICD-10-CM | POA: Diagnosis present

## 2020-12-27 DIAGNOSIS — M6281 Muscle weakness (generalized): Secondary | ICD-10-CM | POA: Diagnosis present

## 2020-12-27 DIAGNOSIS — M25611 Stiffness of right shoulder, not elsewhere classified: Secondary | ICD-10-CM | POA: Insufficient documentation

## 2020-12-27 DIAGNOSIS — R262 Difficulty in walking, not elsewhere classified: Secondary | ICD-10-CM | POA: Insufficient documentation

## 2020-12-27 DIAGNOSIS — R2689 Other abnormalities of gait and mobility: Secondary | ICD-10-CM | POA: Insufficient documentation

## 2020-12-27 DIAGNOSIS — M25552 Pain in left hip: Secondary | ICD-10-CM | POA: Diagnosis present

## 2020-12-27 DIAGNOSIS — G8929 Other chronic pain: Secondary | ICD-10-CM | POA: Diagnosis present

## 2020-12-27 NOTE — Therapy (Signed)
[place], [place], [address] Phone: [phone]   Fax:  [phone]  Physical Therapy Treatment/Re-Cert  Patient Details  Name: Danny Kerr MRN: [id number] Date of Birth: 12-22-1955 Referring Provider (PT): Altamese [place] MD   Encounter Date: 12/27/2020   PT End of Session - 12/27/20 1434    Visit Number 16    Number of Visits 20    Date for PT Re-Evaluation 02/11/21    Authorization Type MCR - KX mod at 15th visit    Progress Note Due on Visit 20    PT Start Time 1053    PT Stop Time 1137    PT Time Calculation (min) 44 min    Activity Tolerance Patient tolerated treatment well    Behavior During Therapy [facility] for tasks assessed/performed           Past Medical History:  Diagnosis Date  . Arrhythmia   . Arthritis   . Displaced segmental fracture of shaft of left femur, initial encounter for closed fracture ([place]) 07/16/2020  . ETOH abuse   . Pneumonia   . Rosacea   . Seasonal allergies     Past Surgical History:  Procedure Laterality Date  . COLONOSCOPY    . FEMUR IM NAIL Left 07/16/2020   Procedure: INTRAMEDULLARY (IM) NAIL FEMORAL;  Surgeon: Altamese [place], MD;  Location: [place];  Service: Orthopedics;  Laterality: Left;  . IR THORACENTESIS ASP PLEURAL SPACE W/IMG GUIDE  11/25/2020  . KNEE ARTHROTOMY  1977   rt knee  . RADIOLOGY WITH ANESTHESIA N/A 09/03/2017   Procedure: MIR CERVICAL Burt Ek;  Surgeon: Radiologist, Medication, MD;  Location: [place];  Service: Radiology;  Laterality: N/A;  . TOTAL KNEE ARTHROPLASTY Right 09/06/2014   Procedure: RIGHT TOTAL KNEE ARTHROPLASTY;  Surgeon: Gearlean Alf, MD;  [facility];  Service: Orthopedics;  Laterality: Right;    There were no vitals filed for this visit.   Subjective Assessment - 12/27/20 1426    Subjective Pt reports he has been having intermittent R shoulder pain. He states it has been present over course of PT, but  did not mention thinking it would get better over time.    Patient Stated Goals increase strenth, reduce atrophy, maintain healthy weight    Currently in Pain? Yes    Pain Score 5     Pain Location Shoulder    Pain Orientation Left    Pain Descriptors / Indicators Aching;Tightness    Pain Type Chronic pain    Pain Onset More than a month ago    Pain Frequency Intermittent    Aggravating Factors  Reaching R arm out to the side    Pain Relieving Factors Rest              [facility] PT Assessment - 12/27/20 0001      AROM   Right Shoulder Flexion 130 Degrees      Strength   Right Shoulder Flexion 4+/5    Right Shoulder Extension 5/5    Right Shoulder ABduction 4+/5    Right Shoulder Internal Rotation --    Right Shoulder External Rotation 4+/5    Right Hip Flexion 4+/5    Right Hip Extension 4+/5    Right Hip ABduction 4+/5    Right Hip ADduction 5/5                         [facility] Adult PT Treatment/Exercise -  12/27/20 0001      Exercises   Exercises Shoulder      Shoulder Exercises: Standing   External Rotation Right;15 reps   2 sets   Theraband Level (Shoulder External Rotation) Level 3 (Green)    Extension Right;15 reps   2 sets   Theraband Level (Shoulder Extension) Level 3 (Green)    Row Right;15 reps   2 sets   Theraband Level (Shoulder Row) Level 3 (Green)      Shoulder Exercises: Stretch   Other Shoulder Stretches Doorway pectoral stretch at 60 and 90d; 2x each; 20 sec                  PT Education - 12/27/20 1433    Education Details HEP- R shoulder, cross friction massage to the R ant. GH jt    Person(s) Educated Patient    Methods Explanation;Demonstration;Tactile cues;Verbal cues;Handout    Comprehension Verbalized understanding;Returned demonstration;Verbal cues required;Tactile cues required            PT Short Term Goals - 11/29/20 1509      PT SHORT TERM GOAL #1   Title pt to be I with inital HEP    Status Achieved       PT SHORT TERM GOAL #2   Title pt to increase gross hip strength to 4-/5 for therapuetic progression    Period Weeks    Status Achieved      PT SHORT TERM GOAL #3   Title increase R shoulder flexion/ abductoin AROM by >/= 8 degress for functional progression. Flexion 130d    Period Weeks    Status Achieved      PT SHORT TERM GOAL #4   Title perfrom berg balance and 6 min walk test and update LTG's    Period Weeks    Status Achieved             PT Long Term Goals - 12/27/20 1440      PT LONG TERM GOAL #1   Title pt to increase R shoulder AROM to [facility] compared bil with max of </= 2/10 pain for mobility required for ADLS. 04/21/05: R shoulder ROM is functional c pain of 5/10 intermittently.    Status On-going    Target Date 02/11/21      PT LONG TERM GOAL #2   Title pt incrase gross RUE strength to >/= 4+/5 in available ROM to promote functioal strength for ADL's    Status Achieved    Target Date 12/27/20      PT LONG TERM GOAL #3   Title increase bil hip gross strength to >/= 4+/5 to promote hip stability with walking/ standing max pain </= 2/10.    Status Achieved    Target Date 12/27/20      PT LONG TERM GOAL #4   Title pt to be able to return to personal exercise and walking routine with no report of limitations per pts personal goals. 12/27/20: Met for L LE. Ongoing for r shoulder    Status Partially Met    Target Date 12/27/20      PT LONG TERM GOAL #5   Title increase FOTO score to >/= 61% function to demo improvement in function. 11/17/20: 75%    Status Achieved    Target Date 12/21/20                 Plan - 12/27/20 1438    Clinical Impression Statement Pt reports today concern with his R  shoulder pain. Pt states over the course of PT being more concerned with his legs, and he felt is R shoulder pain was going to get better.Today, he reports intermittent R shoulder pain of 4/10. Pt is TTP along the bicepital groove, and pain is provoked by active and passive  motions at the endrange of horizonal abd. Suspect bicepital tendinopthy vs. tight ant. [place] joint capsule associated with R proximal humeral fracture. Pt has met goals for the L hip/LE. Pt will benefit from additional PT 1w4 to the R shoulder to decreased pain with functional UE use.    Personal Factors and Comorbidities Comorbidity 1;Social Background    Comorbidities poly Trauma,    Examination-Activity Limitations Stand    Stability/Clinical Decision Making Evolving/Moderate complexity    Clinical Decision Making Moderate    Rehab Potential Good    PT Frequency 1x / week    PT Duration 4 weeks    PT Treatment/Interventions ADLs/Self Care Home Management;Cryotherapy;Iontophoresis 21m/ml Dexamethasone;Moist Heat;Ultrasound;Gait training;Stair training;Functional mobility training;Therapeutic activities;Therapeutic exercise;Balance training;Neuromuscular re-education;Patient/family education;Manual techniques;Passive range of motion;Dry needling;Taping    PT Next Visit Plan Assess response to HEP and cross friction massge to the R shoulder    PT Home Exercise Plan [id number]- Sit to Stand, Standing Hip Abduction & extension with Counter Support + red tband ,  Supine Hamstring Stretch with Strap, SLR ,Supine flexion with 1 lb, shoulder Internal Rotation,reps  Shoulder External Rotation, Standing Shoulder Row with Anchored Resistance. pt is to hold on Sit to Stand exs. Shoulder ext and doorway anterior shoulder stretch.    Consulted and Agree with Plan of Care Patient           Patient will benefit from skilled therapeutic intervention in order to improve the following deficits and impairments:  Improper body mechanics,Increased muscle spasms,Decreased knowledge of precautions,Decreased strength,Postural dysfunction  Visit Diagnosis: Pain in left hip  Muscle weakness (generalized)  Other abnormalities of gait and mobility  Stiffness of right shoulder, not elsewhere classified  Difficulty in  walking, not elsewhere classified     Problem List Patient Active Problem List   Diagnosis Date Noted  . Hyperglycemia 12/06/2020  . Pleural effusion 11/22/2020  . Anxiety 03/23/2020  . Thrombocytopenia ([place]   . Alcohol abuse 10/03/2019  . 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

## 2021-01-06 ENCOUNTER — Other Ambulatory Visit: Payer: Self-pay

## 2021-01-06 ENCOUNTER — Ambulatory Visit: Payer: Medicare Other

## 2021-01-06 DIAGNOSIS — R2689 Other abnormalities of gait and mobility: Secondary | ICD-10-CM

## 2021-01-06 DIAGNOSIS — M25552 Pain in left hip: Secondary | ICD-10-CM | POA: Diagnosis not present

## 2021-01-06 DIAGNOSIS — M6281 Muscle weakness (generalized): Secondary | ICD-10-CM

## 2021-01-06 DIAGNOSIS — G8929 Other chronic pain: Secondary | ICD-10-CM

## 2021-01-06 DIAGNOSIS — M25611 Stiffness of right shoulder, not elsewhere classified: Secondary | ICD-10-CM

## 2021-01-06 DIAGNOSIS — R262 Difficulty in walking, not elsewhere classified: Secondary | ICD-10-CM

## 2021-01-06 NOTE — Therapy (Signed)
[place], [place], [address] Phone: [phone]   Fax:  [phone]  Physical Therapy Treatment  Patient Details  Name: Danny Kerr MRN: [id number] Date of Birth: 08-29-1956 Referring Provider (PT): Altamese [employer] MD   Encounter Date: 01/06/2021   PT End of Session - 01/06/21 1625    Visit Number 17    Number of Visits 20    Date for PT Re-Evaluation 02/11/21    Authorization Type MCR - KX mod at 15th visit    Progress Note Due on Visit 20    PT Start Time 1220    PT Stop Time 1303    PT Time Calculation (min) 43 min    Activity Tolerance Patient tolerated treatment well    Behavior During Therapy [facility] for tasks assessed/performed           Past Medical History:  Diagnosis Date  . Arrhythmia   . Arthritis   . Displaced segmental fracture of shaft of left femur, initial encounter for closed fracture ([place]) 07/16/2020  . ETOH abuse   . Pneumonia   . Rosacea   . Seasonal allergies     Past Surgical History:  Procedure Laterality Date  . COLONOSCOPY    . FEMUR IM NAIL Left 07/16/2020   Procedure: INTRAMEDULLARY (IM) NAIL FEMORAL;  Surgeon: Altamese [place], MD;  Location: [place];  Service: Orthopedics;  Laterality: Left;  . IR THORACENTESIS ASP PLEURAL SPACE W/IMG GUIDE  11/25/2020  . KNEE ARTHROTOMY  1977   rt knee  . RADIOLOGY WITH ANESTHESIA N/A 09/03/2017   Procedure: MIR CERVICAL Burt Ek;  Surgeon: Radiologist, Medication, MD;  Location: [place];  Service: Radiology;  Laterality: N/A;  . TOTAL KNEE ARTHROPLASTY Right 09/06/2014   Procedure: RIGHT TOTAL KNEE ARTHROPLASTY;  Surgeon: Gearlean Alf, MD;  [facility];  Service: Orthopedics;  Laterality: Right;    There were no vitals filed for this visit.   Subjective Assessment - 01/06/21 1616    Subjective Pt reports his R shoulder is continuing to bother him. The pain is sharp when it occurs.    Patient Stated Goals  increase strength, reduce atrophy, maintain healthy weight    Currently in Pain? Yes    Pain Score 5    pain range is 0-7/10   Pain Location Shoulder    Pain Orientation Left    Pain Descriptors / Indicators Aching;Tightness    Pain Type Chronic pain    Pain Onset More than a month ago    Pain Frequency Intermittent    Aggravating Factors  Reaching his R arm out to the side    Pain Relieving Factors Rest    Multiple Pain Sites No                             [facility] Adult PT Treatment/Exercise - 01/06/21 0001      Exercises   Exercises Shoulder      Shoulder Exercises: Standing   Extension Right;10 reps   2 sets   Theraband Level (Shoulder Extension) Level 3 (Green)      Shoulder Exercises: ROM/Strengthening   UBE (Upper Arm Bike) UBE; L1; 2 mins each direction for a total of 8 mins      Modalities   Modalities Iontophoresis      Iontophoresis   Type of Iontophoresis Dexamethasone    Location R ant. shoulder    Dose 58m;  68m/ml    Time 6 hours      Manual Therapy   Manual Therapy Soft tissue mobilization    Soft tissue mobilization STM to the musculature of the R GAultarea f/b cross friction massage to the long head bicep tendon for 5 mins.                  PT Education - 01/06/21 1624    Education Details Cross friction massage to the ant. GAcwortharea 3x daily. Limit shoulder exs to shoulder ext    Person(s) Educated Patient    Methods Explanation;Demonstration;Tactile cues;Verbal cues    Comprehension Verbalized understanding;Returned demonstration;Verbal cues required            PT Short Term Goals - 11/29/20 1509      PT SHORT TERM GOAL #1   Title pt to be I with inital HEP    Status Achieved      PT SHORT TERM GOAL #2   Title pt to increase gross hip strength to 4-/5 for therapuetic progression    Period Weeks    Status Achieved      PT SHORT TERM GOAL #3   Title increase R shoulder flexion/ abductoin AROM by >/= 8 degress for  functional progression. Flexion 130d    Period Weeks    Status Achieved      PT SHORT TERM GOAL #4   Title perfrom berg balance and 6 min walk test and update LTG's    Period Weeks    Status Achieved             PT Long Term Goals - 12/27/20 1440      PT LONG TERM GOAL #1   Title pt to increase R shoulder [facility] with max of </= 2/10 pain for mobility required for ADLS. 36/2/69 R shoulder ROM is functional c pain of 5/10 intermittently.    Status On-going    Target Date 02/11/21      PT LONG TERM GOAL #2   Title pt incrase gross RUE strength to >/= 4+/5 in available ROM to promote functioal strength for ADL's    Status Achieved    Target Date 12/27/20      PT LONG TERM GOAL #3   Title increase bil hip gross strength to >/= 4+/5 to promote hip stability with walking/ standing max pain </= 2/10.    Status Achieved    Target Date 12/27/20      PT LONG TERM GOAL #4   Title pt to be able to return to personal exercise and walking routine with no report of limitations per pts personal goals. 12/27/20: Met for L LE. Ongoing for r shoulder    Status Partially Met    Target Date 12/27/20      PT LONG TERM GOAL #5   Title increase FOTO score to >/= 61% function to demo improvement in function. 11/17/20: 75%    Status Achieved    Target Date 12/21/20                 Plan - 01/06/21 1626    Clinical Impression Statement Pt's ant R GStreatorarea pain is aggrevated by AROM or PROM horizonal abd of the R UE/sh and resisted R sh/UE horizonal add c elbow flexion. Pt was provided posterior scapular strengthening, Provided STM for the R GH musculature c cross friction massage of the bicep tendon. Pt reported greater ease of reaching out to the side  after the STM. Iontophoresis was provided to the ant. [place] area. Pt's home program was modified. See education. Pt's pain seems consistent with bicipital tendinitis.    Personal Factors and Comorbidities Comorbidity 1;Social  Background    Comorbidities poly Trauma,    Examination-Activity Limitations Stand    Stability/Clinical Decision Making Evolving/Moderate complexity    Clinical Decision Making Moderate    Rehab Potential Good    PT Frequency 1x / week    PT Duration 4 weeks    PT Treatment/Interventions ADLs/Self Care Home Management;Cryotherapy;Iontophoresis 85m/ml Dexamethasone;Moist Heat;Ultrasound;Gait training;Stair training;Functional mobility training;Therapeutic activities;Therapeutic exercise;Balance training;Neuromuscular re-education;Patient/family education;Manual techniques;Passive range of motion;Dry needling;Taping    PT Next Visit Plan Assess response to reduced HEP and cross friction massage to the R shoulder    PT Home Exercise Plan [id number]- Sit to Stand, Standing Hip Abduction & extension with Counter Support + red tband ,  Supine Hamstring Stretch with Strap, SLR ,Supine flexion with 1 lb, shoulder Internal Rotation,reps  Shoulder External Rotation, Standing Shoulder Row with Anchored Resistance. pt is to hold on Sit to Stand exs. Shoulder ext and doorway anterior shoulder stretch.    Consulted and Agree with Plan of Care Patient           Patient will benefit from skilled therapeutic intervention in order to improve the following deficits and impairments:  Improper body mechanics,Increased muscle spasms,Decreased knowledge of precautions,Decreased strength,Postural dysfunction  Visit Diagnosis: Muscle weakness (generalized)  Other abnormalities of gait and mobility  Stiffness of right shoulder, not elsewhere classified  Difficulty in walking, not elsewhere classified  Pain in left hip  Chronic right shoulder pain     Problem List Patient Active Problem List   Diagnosis Date Noted  . Hyperglycemia 12/06/2020  . Pleural effusion 11/22/2020  . Anxiety 03/23/2020  . Thrombocytopenia ([place]   . Alcohol abuse 10/03/2019  . 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

## 2021-01-13 ENCOUNTER — Other Ambulatory Visit: Payer: Self-pay

## 2021-01-13 ENCOUNTER — Ambulatory Visit: Payer: Medicare Other

## 2021-01-13 DIAGNOSIS — M25552 Pain in left hip: Secondary | ICD-10-CM

## 2021-01-13 DIAGNOSIS — G8929 Other chronic pain: Secondary | ICD-10-CM

## 2021-01-13 DIAGNOSIS — M25611 Stiffness of right shoulder, not elsewhere classified: Secondary | ICD-10-CM

## 2021-01-13 DIAGNOSIS — R2689 Other abnormalities of gait and mobility: Secondary | ICD-10-CM

## 2021-01-13 DIAGNOSIS — M25511 Pain in right shoulder: Secondary | ICD-10-CM

## 2021-01-13 DIAGNOSIS — R262 Difficulty in walking, not elsewhere classified: Secondary | ICD-10-CM

## 2021-01-13 DIAGNOSIS — M6281 Muscle weakness (generalized): Secondary | ICD-10-CM

## 2021-01-13 NOTE — Therapy (Signed)
[place], [place], [address] Phone: [phone]   Fax:  [phone]  Physical Therapy Treatment  Patient Details  Name: Danny Kerr MRN: [id number] Date of Birth: 1956/08/05 Referring Provider (PT): Altamese Throckmorton MD   Encounter Date: 01/13/2021   PT End of Session - 01/13/21 1446    Visit Number 18    Number of Visits 20    Date for PT Re-Evaluation 02/11/21    Authorization Type MCR - KX mod at 15th visit    Progress Note Due on Visit 20    PT Start Time 1136    PT Stop Time 1206    PT Time Calculation (min) 30 min    Activity Tolerance Patient tolerated treatment well    Behavior During Therapy [facility] for tasks assessed/performed           Past Medical History:  Diagnosis Date  . Arrhythmia   . Arthritis   . Displaced segmental fracture of shaft of left femur, initial encounter for closed fracture ([place]) 07/16/2020  . ETOH abuse   . Pneumonia   . Rosacea   . Seasonal allergies     Past Surgical History:  Procedure Laterality Date  . COLONOSCOPY    . FEMUR IM NAIL Left 07/16/2020   Procedure: INTRAMEDULLARY (IM) NAIL FEMORAL;  Surgeon: Altamese [place], MD;  Location: [place];  Service: Orthopedics;  Laterality: Left;  . IR THORACENTESIS ASP PLEURAL SPACE W/IMG GUIDE  11/25/2020  . KNEE ARTHROTOMY  1977   rt knee  . RADIOLOGY WITH ANESTHESIA N/A 09/03/2017   Procedure: MIR CERVICAL Burt Ek;  Surgeon: Radiologist, Medication, MD;  Location: [place];  Service: Radiology;  Laterality: N/A;  . TOTAL KNEE ARTHROPLASTY Right 09/06/2014   Procedure: RIGHT TOTAL KNEE ARTHROPLASTY;  Surgeon: Gearlean Alf, MD;  [facility];  Service: Orthopedics;  Laterality: Right;    There were no vitals filed for this visit.   Subjective Assessment - 01/13/21 1155    Subjective Pt reports his R shoulder is doing much better. Rates 80% improvement    Patient Stated Goals increase strength,  reduce atrophy, maintain healthy weight    Currently in Pain? Yes    Pain Score 5     Pain Location Shoulder    Pain Orientation Right                             [facility] Adult PT Treatment/Exercise - 01/13/21 0001      Exercises   Exercises Shoulder      Shoulder Exercises: Standing   Extension Right;15 reps   3 sets   Theraband Level (Shoulder Extension) Level 3 (Green)    Row Right;15 reps   3 sets   Theraband Level (Shoulder Row) Level 3 (Green)      Iontophoresis   Type of Iontophoresis Dexamethasone    Location R ant. shoulder    Dose 16m; 457mml    Time 6 hours      Manual Therapy   Manual Therapy Soft tissue mobilization    Soft tissue mobilization STM to the musculature of the R GH area f/b cross friction massage to the long head bicep tendon for 5 mins.                  PT Education - 01/13/21 1445    Education Details Continue dross friction massage 3x daily and his HEP for  posterior cahin strengthening.    Person(s) Educated Patient    Methods Explanation    Comprehension Verbalized understanding            PT Short Term Goals - 11/29/20 1509      PT SHORT TERM GOAL #1   Title pt to be I with inital HEP    Status Achieved      PT SHORT TERM GOAL #2   Title pt to increase gross hip strength to 4-/5 for therapuetic progression    Period Weeks    Status Achieved      PT SHORT TERM GOAL #3   Title increase R shoulder flexion/ abductoin AROM by >/= 8 degress for functional progression. Flexion 130d    Period Weeks    Status Achieved      PT SHORT TERM GOAL #4   Title perfrom berg balance and 6 min walk test and update LTG's    Period Weeks    Status Achieved             PT Long Term Goals - 12/27/20 1440      PT LONG TERM GOAL #1   Title pt to increase R shoulder AROM to [facility] compared bil with max of </= 2/10 pain for mobility required for ADLS. 9/0/38: R shoulder ROM is functional c pain of 5/10 intermittently.     Status On-going    Target Date 02/11/21      PT LONG TERM GOAL #2   Title pt incrase gross RUE strength to >/= 4+/5 in available ROM to promote functioal strength for ADL's    Status Achieved    Target Date 12/27/20      PT LONG TERM GOAL #3   Title increase bil hip gross strength to >/= 4+/5 to promote hip stability with walking/ standing max pain </= 2/10.    Status Achieved    Target Date 12/27/20      PT LONG TERM GOAL #4   Title pt to be able to return to personal exercise and walking routine with no report of limitations per pts personal goals. 12/27/20: Met for L LE. Ongoing for r shoulder    Status Partially Met    Target Date 12/27/20      PT LONG TERM GOAL #5   Title increase FOTO score to >/= 61% function to demo improvement in function. 11/17/20: 75%    Status Achieved    Target Date 12/21/20                 Plan - 01/13/21 1503    Clinical Impression Statement Current course of PT is being effective with decreasing pt's R shoulder pain. Treatment remained the same with CFM, posterior shoulder/scapular strengthening and the use of iontophoresis.    Personal Factors and Comorbidities Comorbidity 1;Social Background    Comorbidities poly Trauma,    Examination-Activity Limitations Stand    Stability/Clinical Decision Making Evolving/Moderate complexity    Clinical Decision Making Moderate    Rehab Potential Good    PT Frequency 1x / week    PT Duration 4 weeks    PT Treatment/Interventions ADLs/Self Care Home Management;Cryotherapy;Iontophoresis 48m/ml Dexamethasone;Moist Heat;Ultrasound;Gait training;Stair training;Functional mobility training;Therapeutic activities;Therapeutic exercise;Balance training;Neuromuscular re-education;Patient/family education;Manual techniques;Passive range of motion;Dry needling;Taping    PT Next Visit Plan Assess response to reduced HEP and cross friction massage to the R shoulder    PT Home Exercise Plan [id number]- Sit to Stand,  Standing Hip Abduction & extension with Counter Support +  red tband ,  Supine Hamstring Stretch with Strap, SLR ,Supine flexion with 1 lb, shoulder Internal Rotation,reps  Shoulder External Rotation, Standing Shoulder Row with Anchored Resistance. pt is to hold on Sit to Stand exs. Shoulder ext and doorway anterior shoulder stretch.    Consulted and Agree with Plan of Care Patient           Patient will benefit from skilled therapeutic intervention in order to improve the following deficits and impairments:  Improper body mechanics,Increased muscle spasms,Decreased knowledge of precautions,Decreased strength,Postural dysfunction  Visit Diagnosis: Muscle weakness (generalized)  Other abnormalities of gait and mobility  Stiffness of right shoulder, not elsewhere classified  Difficulty in walking, not elsewhere classified  Chronic right shoulder pain  Pain in left hip  Acute pain of right shoulder     Problem List Patient Active Problem List   Diagnosis Date Noted  . Hyperglycemia 12/06/2020  . Pleural effusion 11/22/2020  . Anxiety 03/23/2020  . Thrombocytopenia ([place])   . Alcohol abuse 10/03/2019  . 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 [place], [place], [address] Phone: [phone]   Fax:  [phone]  Name: Danny Kerr MRN: [id number] Date of Birth: December 29, 1955

## 2021-01-17 ENCOUNTER — Other Ambulatory Visit: Payer: Self-pay

## 2021-01-17 ENCOUNTER — Ambulatory Visit: Payer: Medicare Other

## 2021-01-17 DIAGNOSIS — M25552 Pain in left hip: Secondary | ICD-10-CM

## 2021-01-17 DIAGNOSIS — M25611 Stiffness of right shoulder, not elsewhere classified: Secondary | ICD-10-CM

## 2021-01-17 DIAGNOSIS — G8929 Other chronic pain: Secondary | ICD-10-CM

## 2021-01-17 DIAGNOSIS — M6281 Muscle weakness (generalized): Secondary | ICD-10-CM

## 2021-01-17 DIAGNOSIS — R262 Difficulty in walking, not elsewhere classified: Secondary | ICD-10-CM

## 2021-01-17 DIAGNOSIS — M25511 Pain in right shoulder: Secondary | ICD-10-CM

## 2021-01-17 DIAGNOSIS — R2689 Other abnormalities of gait and mobility: Secondary | ICD-10-CM

## 2021-01-17 NOTE — Therapy (Signed)
[place], [place], [address] Phone: [phone]   Fax:  [phone]  Physical Therapy Treatment  Patient Details  Name: Danny Kerr MRN: [id number] Date of Birth: Jul 28, 1956 Referring Provider (PT): Altamese Union MD   Encounter Date: 01/17/2021   PT End of Session - 01/17/21 1536    Visit Number 19    Number of Visits 20    Date for PT Re-Evaluation 02/11/21    Authorization Type MCR - KX mod at 15th visit    Progress Note Due on Visit 20    PT Start Time 1536    PT Stop Time 1616    PT Time Calculation (min) 40 min    Activity Tolerance Patient tolerated treatment well    Behavior During Therapy [facility] for tasks assessed/performed           Past Medical History:  Diagnosis Date  . Arrhythmia   . Arthritis   . Displaced segmental fracture of shaft of left femur, initial encounter for closed fracture ([place]) 07/16/2020  . ETOH abuse   . Pneumonia   . Rosacea   . Seasonal allergies     Past Surgical History:  Procedure Laterality Date  . COLONOSCOPY    . FEMUR IM NAIL Left 07/16/2020   Procedure: INTRAMEDULLARY (IM) NAIL FEMORAL;  Surgeon: Altamese Coleridge, MD;  Location: [place];  Service: Orthopedics;  Laterality: Left;  . IR THORACENTESIS ASP PLEURAL SPACE W/IMG GUIDE  11/25/2020  . KNEE ARTHROTOMY  1977   rt knee  . RADIOLOGY WITH ANESTHESIA N/A 09/03/2017   Procedure: MIR CERVICAL Burt Ek;  Surgeon: Radiologist, Medication, MD;  Location: [place];  Service: Radiology;  Laterality: N/A;  . TOTAL KNEE ARTHROPLASTY Right 09/06/2014   Procedure: RIGHT TOTAL KNEE ARTHROPLASTY;  Surgeon: Gearlean Alf, MD;  [facility];  Service: Orthopedics;  Laterality: Right;    There were no vitals filed for this visit.   Subjective Assessment - 01/17/21 1540    Subjective Pt reports the R shoulder is continuing to do better re: pain and mobility    Patient Stated Goals increase strength,  reduce atrophy, maintain healthy weight    Currently in Pain? Yes    Pain Score 4     Pain Location Shoulder    Pain Orientation Right    Pain Descriptors / Indicators Aching;Tightness    Pain Onset More than a month ago    Pain Frequency Intermittent    Multiple Pain Sites No                             [facility] Adult PT Treatment/Exercise - 01/17/21 0001      Exercises   Exercises Shoulder;Knee/Hip      Knee/Hip Exercises: Machines for Strengthening   Cybex Knee Extension 3x15; 35#    Cybex Knee Flexion 3x15, 45#      Knee/Hip Exercises: Standing   Hip Flexion Right;Left;2 sets;15 reps    Hip Flexion Limitations 5#    Hip Abduction Right;Left;2 sets;15 reps;Knee straight    Abduction Limitations 5#      Shoulder Exercises: Standing   Extension Right;10 reps   3 sets   Theraband Level (Shoulder Extension) Level 3 (Green)    Row Right;10 reps   3 sets   Theraband Level (Shoulder Row) Level 3 (Green)      Shoulder Exercises: ROM/Strengthening   UBE (Upper Arm Bike) UBE;  L1; 4 mins, 2 mins each direction      Iontophoresis   Type of Iontophoresis Dexamethasone    Location R ant. shoulder    Dose 53m; 430mml    Time 6 hours      Manual Therapy   Manual Therapy Soft tissue mobilization;Joint mobilization    Joint Mobilization Grade lll R GH jt inf. distraction, lat distraction, and inf glides to [facility] mobility    Soft tissue mobilization STM to the musculature of the R GH[place]ow Riverrea f/b cross friction massage to the long head bicep tendon for 5  mins.                    PT Short Term Goals - 11/29/20 1509      PT SHORT TERM GOAL #1   Title pt to be I with inital HEP    Status Achieved      PT SHORT TERM GOAL #2   Title pt to increase gross hip strength to 4-/5 for therapuetic progression    Period Weeks    Status Achieved      PT SHORT TERM GOAL #3   Title increase R shoulder flexion/ abductoin AROM by >/= 8 degress for functional  progression. Flexion 130d    Period Weeks    Status Achieved      PT SHORT TERM GOAL #4   Title perfrom berg balance and 6 min walk test and update LTG's    Period Weeks    Status Achieved             PT Long Term Goals - 12/27/20 1440      PT LONG TERM GOAL #1   Title pt to increase R shoulder AROM to [facility] bil with max of </= 2/10 pain for mobility required for ADLS. 3/3/9/03R shoulder ROM is functional c pain of 5/10 intermittently.    Status On-going    Target Date 02/11/21      PT LONG TERM GOAL #2   Title pt incrase gross RUE strength to >/= 4+/5 in available ROM to promote functioal strength for ADL's    Status Achieved    Target Date 12/27/20      PT LONG TERM GOAL #3   Title increase bil hip gross strength to >/= 4+/5 to promote hip stability with walking/ standing max pain </= 2/10.    Status Achieved    Target Date 12/27/20      PT LONG TERM GOAL #4   Title pt to be able to return to personal exercise and walking routine with no report of limitations per pts personal goals. 12/27/20: Met for L LE. Ongoing for r shoulder    Status Partially Met    Target Date 12/27/20      PT LONG TERM GOAL #5   Title increase FOTO score to >/= 61% function to demo improvement in function. 11/17/20: 75%    Status Achieved    Target Date 12/21/20                 Plan - 01/17/21 1537    Clinical Impression Statement Pt reports R shoulder pain and mobility are improving. PT was completed for L GH jt mobility and shoulder posterior chain strengthening, LE strengthening, and for pain management with iontophoresis.  Pt will continue to benefit from PT to address R shoulder pain, ROM, strength, and function.    Personal Factors and Comorbidities Comorbidity 1;Social Background    Comorbidities  poly Trauma,    Examination-Activity Limitations Stand    Stability/Clinical Decision Making Evolving/Moderate complexity    Clinical Decision Making Moderate    Rehab Potential  Good    PT Frequency 1x / week    PT Duration 4 weeks    PT Treatment/Interventions ADLs/Self Care Home Management;Cryotherapy;Iontophoresis 25m/ml Dexamethasone;Moist Heat;Ultrasound;Gait training;Stair training;Functional mobility training;Therapeutic activities;Therapeutic exercise;Balance training;Neuromuscular re-education;Patient/family education;Manual techniques;Passive range of motion;Dry needling;Taping    PT Next Visit Plan Assess response to reduced HEP and cross friction massage to the R shoulder    PT Home Exercise Plan [id number]- Sit to Stand, Standing Hip Abduction & extension with Counter Support + red tband ,  Supine Hamstring Stretch with Strap, SLR ,Supine flexion with 1 lb, shoulder Internal Rotation,reps  Shoulder External Rotation, Standing Shoulder Row with Anchored Resistance. pt is to hold on Sit to Stand exs. Shoulder ext and doorway anterior shoulder stretch.    Consulted and Agree with Plan of Care Patient           Patient will benefit from skilled therapeutic intervention in order to improve the following deficits and impairments:  Improper body mechanics,Increased muscle spasms,Decreased knowledge of precautions,Decreased strength,Postural dysfunction  Visit Diagnosis: Muscle weakness (generalized)  Other abnormalities of gait and mobility  Stiffness of right shoulder, not elsewhere classified  Difficulty in walking, not elsewhere classified  Chronic right shoulder pain  Pain in left hip  Acute pain of right shoulder     Problem List Patient Active Problem List   Diagnosis Date Noted  . Hyperglycemia 12/06/2020  . Pleural effusion 11/22/2020  . Anxiety 03/23/2020  . Thrombocytopenia ([place]   . Alcohol abuse 10/03/2019  . 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

## 2021-01-25 ENCOUNTER — Other Ambulatory Visit: Payer: Self-pay | Admitting: *Deleted

## 2021-01-25 DIAGNOSIS — R911 Solitary pulmonary nodule: Secondary | ICD-10-CM

## 2021-01-25 DIAGNOSIS — J90 Pleural effusion, not elsewhere classified: Secondary | ICD-10-CM

## 2021-01-26 ENCOUNTER — Ambulatory Visit: Payer: Medicare Other | Attending: Orthopedic Surgery

## 2021-01-26 ENCOUNTER — Other Ambulatory Visit: Payer: Self-pay

## 2021-01-26 DIAGNOSIS — G8929 Other chronic pain: Secondary | ICD-10-CM | POA: Diagnosis present

## 2021-01-26 DIAGNOSIS — R262 Difficulty in walking, not elsewhere classified: Secondary | ICD-10-CM | POA: Insufficient documentation

## 2021-01-26 DIAGNOSIS — M6281 Muscle weakness (generalized): Secondary | ICD-10-CM | POA: Insufficient documentation

## 2021-01-26 DIAGNOSIS — M25611 Stiffness of right shoulder, not elsewhere classified: Secondary | ICD-10-CM | POA: Insufficient documentation

## 2021-01-26 DIAGNOSIS — M25552 Pain in left hip: Secondary | ICD-10-CM | POA: Insufficient documentation

## 2021-01-26 DIAGNOSIS — R2689 Other abnormalities of gait and mobility: Secondary | ICD-10-CM | POA: Insufficient documentation

## 2021-01-26 DIAGNOSIS — M25511 Pain in right shoulder: Secondary | ICD-10-CM | POA: Diagnosis present

## 2021-01-26 NOTE — Therapy (Signed)
[place], [place], [address] Phone: [phone]   Fax:  [phone]  Physical Therapy Treatment/Progress Note  Progress Note Reporting Period 11/29/20 to 01/26/21  See note below for Objective Data and Assessment of Progress/Goals.     Patient Details  Name: Danny Kerr MRN: [id number] Date of Birth: 1956-05-11 Referring Provider (PT): Altamese [place] MD   Encounter Date: 01/26/2021   PT End of Session - 01/26/21 1044    Visit Number 20    Number of Visits 20    Date for PT Re-Evaluation 02/11/21    Authorization Type MCR - KX mod at 15th visit    Progress Note Due on Visit 20    PT Start Time 1003    PT Stop Time 1100    PT Time Calculation (min) 57 min    Activity Tolerance Patient tolerated treatment well    Behavior During Therapy [facility] for tasks assessed/performed           Past Medical History:  Diagnosis Date  . Arrhythmia   . Arthritis   . Displaced segmental fracture of shaft of left femur, initial encounter for closed fracture ([place]) 07/16/2020  . ETOH abuse   . Pneumonia   . Rosacea   . Seasonal allergies     Past Surgical History:  Procedure Laterality Date  . COLONOSCOPY    . FEMUR IM NAIL Left 07/16/2020   Procedure: INTRAMEDULLARY (IM) NAIL FEMORAL;  Surgeon: Altamese , MD;  Location: [place];  Service: Orthopedics;  Laterality: Left;  . IR THORACENTESIS ASP PLEURAL SPACE W/IMG GUIDE  11/25/2020  . KNEE ARTHROTOMY  1977   rt knee  . RADIOLOGY WITH ANESTHESIA N/A 09/03/2017   Procedure: MIR CERVICAL Burt Ek;  Surgeon: Radiologist, Medication, MD;  Location: [place];  Service: Radiology;  Laterality: N/A;  . TOTAL KNEE ARTHROPLASTY Right 09/06/2014   Procedure: RIGHT TOTAL KNEE ARTHROPLASTY;  Surgeon: Gearlean Alf, MD;  [facility];  Service: Orthopedics;  Laterality: Right;    There were no vitals filed for this visit.   Subjective Assessment -  01/26/21 1007    Subjective Pt reports helping with a move, lifting boxes, and he felt good about how he did. Low R shoulder aggrevation    Currently in Pain? No/denies    Pain Score 2    certain movements, intermittent   Pain Location Shoulder    Pain Orientation Right    Pain Descriptors / Indicators Aching;Tightness    Pain Type Chronic pain    Pain Onset More than a month ago    Pain Frequency Intermittent              [facility] PT Assessment - 01/26/21 0001      Strength   Right Shoulder Flexion 5/5    Right Shoulder Extension 5/5    Right Shoulder ABduction 5/5    Right Shoulder Internal Rotation 5/5    Right Shoulder External Rotation 5/5                         [facility] Adult PT Treatment/Exercise - 01/26/21 0001      Knee/Hip Exercises: Machines for Strengthening   Cybex Knee Extension 3x10; L and R; 15#      Shoulder Exercises: Standing   External Rotation Right;10 reps   2 sets   Internal Rotation Right;10 reps   2 sets   Flexion Both;10 reps  2 sets   Shoulder Flexion Weight (lbs) 2    Extension Both;15 reps   2 sets   Theraband Level (Shoulder Extension) Level 4 (Blue)    Row Both;15 reps   2 sets   Theraband Level (Shoulder Row) Level 4 (Blue)      Modalities   Modalities Cryotherapy      Cryotherapy   Number Minutes Cryotherapy 15 Minutes    Cryotherapy Location Shoulder    Type of Cryotherapy Ice pack                  PT Education - 01/26/21 1049    Education Details HEP: R shoulder ER and IR were added back into HEP. Pt to complete as tolerated    Person(s) Educated Patient    Methods Explanation;Demonstration;Tactile cues;Verbal cues    Comprehension Verbalized understanding;Returned demonstration;Verbal cues required;Tactile cues required            PT Short Term Goals - 11/29/20 1509      PT SHORT TERM GOAL #1   Title pt to be I with inital HEP    Status Achieved      PT SHORT TERM GOAL #2   Title pt to increase  gross hip strength to 4-/5 for therapuetic progression    Period Weeks    Status Achieved      PT SHORT TERM GOAL #3   Title increase R shoulder flexion/ abductoin AROM by >/= 8 degress for functional progression. Flexion 130d    Period Weeks    Status Achieved      PT SHORT TERM GOAL #4   Title perfrom berg balance and 6 min walk test and update LTG's    Period Weeks    Status Achieved             PT Long Term Goals - 12/27/20 1440      PT LONG TERM GOAL #1   Title pt to increase R shoulder AROM to [facility] compared bil with max of </= 2/10 pain for mobility required for ADLS. 12/26/08: R shoulder ROM is functional c pain of 5/10 intermittently.    Status On-going    Target Date 02/11/21      PT LONG TERM GOAL #2   Title pt incrase gross RUE strength to >/= 4+/5 in available ROM to promote functioal strength for ADL's    Status Achieved    Target Date 12/27/20      PT LONG TERM GOAL #3   Title increase bil hip gross strength to >/= 4+/5 to promote hip stability with walking/ standing max pain </= 2/10.    Status Achieved    Target Date 12/27/20      PT LONG TERM GOAL #4   Title pt to be able to return to personal exercise and walking routine with no report of limitations per pts personal goals. 12/27/20: Met for L LE. Ongoing for r shoulder    Status Partially Met    Target Date 12/27/20      PT LONG TERM GOAL #5   Title increase FOTO score to >/= 61% function to demo improvement in function. 11/17/20: 75%    Status Achieved    Target Date 12/21/20                 Plan - 01/26/21 1045    Clinical Impression Statement Pt reports his R shoulder is continuing to do better c less discomfort on an intermittent basis. Pt demonstrated improved strength  and tolerance to MMT and strengthening exs to muscle goups giving him issues previously. Additionally, pt noted improvement when lifting boxes helping with a move. R shoulder ER and IR were re-initaied today with pt tolerating  well. Pt is to return to PT 4/19 for re-assessment for DC or continuation of PT services as indicated.    Personal Factors and Comorbidities Comorbidity 1;Social Background    Comorbidities poly Trauma,    Examination-Activity Limitations Stand    Stability/Clinical Decision Making Evolving/Moderate complexity    Clinical Decision Making Moderate    Rehab Potential Good    PT Frequency 1x / week    PT Duration 2 weeks    PT Treatment/Interventions ADLs/Self Care Home Management;Cryotherapy;Iontophoresis 32m/ml Dexamethasone;Moist Heat;Ultrasound;Gait training;Stair training;Functional mobility training;Therapeutic activities;Therapeutic exercise;Balance training;Neuromuscular re-education;Patient/family education;Manual techniques;Passive range of motion;Dry needling;Taping    PT Next Visit Plan Pt is to return to PT 4/19 for re-assessment for DC or continuation of PT services as indicated.    PT Home Exercise Plan [id number]- Sit to Stand, Standing Hip Abduction & extension with Counter Support + red tband ,  Supine Hamstring Stretch with Strap, SLR ,Supine flexion with 1 lb, shoulder Internal Rotation,reps  Shoulder External Rotation, Standing Shoulder Row with Anchored Resistance. pt is to hold on Sit to Stand exs. Shoulder ext and doorway anterior shoulder stretch.    Consulted and Agree with Plan of Care Patient           Patient will benefit from skilled therapeutic intervention in order to improve the following deficits and impairments:  Improper body mechanics,Increased muscle spasms,Decreased knowledge of precautions,Decreased strength,Postural dysfunction  Visit Diagnosis: Muscle weakness (generalized)  Other abnormalities of gait and mobility  Difficulty in walking, not elsewhere classified  Stiffness of right shoulder, not elsewhere classified  Chronic right shoulder pain  Pain in left hip  Acute pain of right shoulder     Problem List Patient Active Problem List    Diagnosis Date Noted  . Hyperglycemia 12/06/2020  . Pleural effusion 11/22/2020  . Anxiety 03/23/2020  . Thrombocytopenia ([place]   . Alcohol abuse 10/03/2019  . 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

## 2021-02-07 ENCOUNTER — Ambulatory Visit: Payer: Medicare Other

## 2021-02-07 ENCOUNTER — Other Ambulatory Visit: Payer: Self-pay

## 2021-02-07 DIAGNOSIS — R2689 Other abnormalities of gait and mobility: Secondary | ICD-10-CM

## 2021-02-07 DIAGNOSIS — R262 Difficulty in walking, not elsewhere classified: Secondary | ICD-10-CM

## 2021-02-07 DIAGNOSIS — M6281 Muscle weakness (generalized): Secondary | ICD-10-CM

## 2021-02-07 DIAGNOSIS — M25611 Stiffness of right shoulder, not elsewhere classified: Secondary | ICD-10-CM

## 2021-02-07 DIAGNOSIS — M25552 Pain in left hip: Secondary | ICD-10-CM

## 2021-02-07 DIAGNOSIS — G8929 Other chronic pain: Secondary | ICD-10-CM

## 2021-02-07 DIAGNOSIS — M25511 Pain in right shoulder: Secondary | ICD-10-CM

## 2021-02-08 NOTE — Therapy (Signed)
[place], [place], [address] Phone: [phone]   Fax:  [phone]  Physical Therapy Treatment/Discharge Summary  Patient Details  Name: Danny Kerr MRN: [id number] Date of Birth: 1956/06/09 Referring Provider (PT): Altamese [place] MD   Encounter Date: 02/07/2021   PT End of Session - 02/08/21 0815    Visit Number 21    Number of Visits 20    Date for PT Re-Evaluation 02/11/21    Authorization Type MCR - KX mod at 15th visit    PT Start Time 1536    PT Stop Time 1618    PT Time Calculation (min) 42 min    Activity Tolerance Patient tolerated treatment well    Behavior During Therapy [facility] for tasks assessed/performed           Past Medical History:  Diagnosis Date  . Arrhythmia   . Arthritis   . Displaced segmental fracture of shaft of left femur, initial encounter for closed fracture ([place]) 07/16/2020  . ETOH abuse   . Pneumonia   . Rosacea   . Seasonal allergies     Past Surgical History:  Procedure Laterality Date  . COLONOSCOPY    . FEMUR IM NAIL Left 07/16/2020   Procedure: INTRAMEDULLARY (IM) NAIL FEMORAL;  Surgeon: Altamese [place], MD;  Location: [place];  Service: Orthopedics;  Laterality: Left;  . IR THORACENTESIS ASP PLEURAL SPACE W/IMG GUIDE  11/25/2020  . KNEE ARTHROTOMY  1977   rt knee  . RADIOLOGY WITH ANESTHESIA N/A 09/03/2017   Procedure: MIR CERVICAL Burt Ek;  Surgeon: Radiologist, Medication, MD;  Location: [place];  Service: Radiology;  Laterality: N/A;  . TOTAL KNEE ARTHROPLASTY Right 09/06/2014   Procedure: RIGHT TOTAL KNEE ARTHROPLASTY;  Surgeon: Gearlean Alf, MD;  [facility];  Service: Orthopedics;  Laterality: Right;    There were no vitals filed for this visit.   Subjective Assessment - 02/07/21 1540    Subjective R intermittent discomfort of the R shoulder. Pt reports it is 90% improved . With return to exercising in a gym, pt experienced R knee  has brief sharp of the L knee lateral joint space pain yesterday when walking and changing directions. Pt reports walking a straight path does nor bother him. Pt reports completing knee extension exs at the gym at 35 lbs.    Patient Stated Goals increase strength, reduce atrophy, maintain healthy weight    Currently in Pain? No/denies    Pain Score 1     Pain Location Shoulder    Pain Orientation Right    Pain Descriptors / Indicators Aching    Pain Type Chronic pain    Pain Onset More than a month ago    Pain Frequency Occasional                             [facility] Adult PT Treatment/Exercise - 02/08/21 0001      Knee/Hip Exercises: Standing   Hip Flexion Right;Left;1 set;10 reps    Hip Abduction Right;Left;10 reps    Hip Extension Right;Left;10 reps      Knee/Hip Exercises: Supine   Straight Leg Raises Left;10 reps      Knee/Hip Exercises: Sidelying   Hip ABduction Left;10 reps      Knee/Hip Exercises: Prone   Hip Extension Left;10 reps                  PT  Education - 02/08/21 0810    Education Details To decrease the weight for knee ext exs at the gym to 15lbs and progress as tolerated 5 lbs every 2 weeks as tolerated. Continue to complete straight leg strengthening exs for quad strengthening, but to minimize impact of the  knee L knee    Person(s) Educated Patient    Methods Explanation    Comprehension Verbalized understanding            PT Short Term Goals - 11/29/20 1509      PT SHORT TERM GOAL #1   Title pt to be I with inital HEP    Status Achieved      PT SHORT TERM GOAL #2   Title pt to increase gross hip strength to 4-/5 for therapuetic progression    Period Weeks    Status Achieved      PT SHORT TERM GOAL #3   Title increase R shoulder flexion/ abductoin AROM by >/= 8 degress for functional progression. Flexion 130d    Period Weeks    Status Achieved      PT SHORT TERM GOAL #4   Title perfrom berg balance and 6 min walk  test and update LTG's    Period Weeks    Status Achieved             PT Long Term Goals - 02/08/21 4174      PT LONG TERM GOAL #1   Title pt to increase R shoulder AROM to [facility] compared bil with max of </= 2/10 pain for mobility required for ADLS. 0/8/14: R shoulder ROM is functional c pain of 5/10 intermittently.    Status Achieved    Target Date 02/07/21      PT LONG TERM GOAL #2   Title pt incrase gross RUE strength to >/= 4+/5 in available ROM to promote functioal strength for ADL's    Status Achieved    Target Date 02/07/21      PT LONG TERM GOAL #3   Title increase bil hip gross strength to >/= 4+/5 to promote hip stability with walking/ standing max pain </= 2/10.    Status Achieved    Target Date 02/07/21      PT LONG TERM GOAL #4   Title pt to be able to return to personal exercise and walking routine with no report of limitations per pts personal goals. 12/27/20: Met for L LE. 02/07/21: Met for R shoulder    Status Achieved    Target Date 02/07/21      PT LONG TERM GOAL #5   Title increase FOTO score to >/= 61% function to demo improvement in function. 11/17/20: 75%    Status Achieved    Target Date 12/21/20                 Plan - 02/08/21 0816    Clinical Impression Statement Pt returns to PT with his R shoulder continuing to improve re: pain and function. Pt has returned to a gym for exercise and following working out yesterday he experienced L knee pain when changing walking directions which was brief and sharp without lasting pain. Ligamentus and meniscal testing of the L knee was completed and found negative. Pt's pain was not able to be reproduced. Pt reports he is pushing himself with his work outs, and pain may be patellofemoral in nature after a strenuous workout. Pt was provided a guideline re: a gradual progression of strengthening with his L LE  and knee. Pt voiced understanding. If pt continues to have pain with his L knee with a modification in his  exercise program, then he is to obtain a referral for additional PT. Pt is DCed from PT with all goals met.    Personal Factors and Comorbidities Comorbidity 1;Social Background    Comorbidities poly Trauma,    Examination-Activity Limitations Stand    Stability/Clinical Decision Making Evolving/Moderate complexity    Clinical Decision Making Moderate    Rehab Potential Good    PT Treatment/Interventions ADLs/Self Care Home Management;Cryotherapy;Iontophoresis 11m/ml Dexamethasone;Moist Heat;Ultrasound;Gait training;Stair training;Functional mobility training;Therapeutic activities;Therapeutic exercise;Balance training;Neuromuscular re-education;Patient/family education;Manual techniques;Passive range of motion;Dry needling;Taping    PT Home Exercise Plan [id number]- Sit to Stand, Standing Hip Abduction & extension with Counter Support + red tband ,  Supine Hamstring Stretch with Strap, SLR ,Supine flexion with 1 lb, shoulder Internal Rotation,reps  Shoulder External Rotation, Standing Shoulder Row with Anchored Resistance. pt is to hold on Sit to Stand exs. Shoulder ext and doorway anterior shoulder stretch.    Consulted and Agree with Plan of Care Patient           Patient will benefit from skilled therapeutic intervention in order to improve the following deficits and impairments:  Improper body mechanics,Increased muscle spasms,Decreased knowledge of precautions,Decreased strength,Postural dysfunction  Visit Diagnosis: Muscle weakness (generalized)  Other abnormalities of gait and mobility  Stiffness of right shoulder, not elsewhere classified  Chronic right shoulder pain  Pain in left hip  Acute pain of right shoulder  Difficulty in walking, not elsewhere classified     Problem List Patient Active Problem List   Diagnosis Date Noted  . Hyperglycemia 12/06/2020  . Pleural effusion 11/22/2020  . Anxiety 03/23/2020  . Thrombocytopenia ([place]   . Alcohol abuse 10/03/2019  .  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  Patient goals were met. Patient is being discharged due to meeting the stated rehab goals.  ?????       AGar PontoMS, PT 02/08/21 9:38 AM  [facility] S [address] [address]Phone: [phone]  Fax:  [phone] Name: Danny MURATAMRN: [id number] of Birth: 904/12/1955

## 2021-03-08 ENCOUNTER — Ambulatory Visit
Admission: RE | Admit: 2021-03-08 | Discharge: 2021-03-08 | Disposition: A | Discharge status: Home / Self Care | Payer: Medicare Other | Source: Ambulatory Visit | Attending: Thoracic Surgery (Cardiothoracic Vascular Surgery) | Admitting: Thoracic Surgery (Cardiothoracic Vascular Surgery)

## 2021-03-08 DIAGNOSIS — J90 Pleural effusion, not elsewhere classified: Secondary | ICD-10-CM

## 2021-03-08 DIAGNOSIS — R911 Solitary pulmonary nodule: Secondary | ICD-10-CM

## 2021-03-10 ENCOUNTER — Other Ambulatory Visit: Payer: Medicare Other

## 2021-03-14 ENCOUNTER — Ambulatory Visit (INDEPENDENT_AMBULATORY_CARE_PROVIDER_SITE_OTHER): Payer: Medicare Other | Admitting: Thoracic Surgery (Cardiothoracic Vascular Surgery)

## 2021-03-14 ENCOUNTER — Encounter: Payer: Self-pay | Admitting: Thoracic Surgery (Cardiothoracic Vascular Surgery)

## 2021-03-14 ENCOUNTER — Other Ambulatory Visit: Payer: Self-pay

## 2021-03-14 VITALS — BP 127/78 | HR 77 | Resp 20 | Ht 75.0 in | Wt 193.0 lb

## 2021-03-14 DIAGNOSIS — R911 Solitary pulmonary nodule: Secondary | ICD-10-CM

## 2021-03-14 DIAGNOSIS — J90 Pleural effusion, not elsewhere classified: Secondary | ICD-10-CM | POA: Diagnosis not present
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
## 2021-07-18 ENCOUNTER — Ambulatory Visit: Payer: Medicare Other | Admitting: Family Medicine

## 2021-07-18 NOTE — Progress Notes (Incomplete)
   Danny Kerr is a 65 y.o. male who presents today for an office visit.  Assessment/Plan:  New/Acute Problems: ***  Chronic Problems Addressed Today: No problem-specific Assessment & Plan notes found for this encounter.     Subjective:  HPI:  He is here for follow up. He was last seen about 7 months ago.          Objective:  Physical Exam: There were no vitals taken for this visit.  Gen: No acute distress, resting comfortably*** CV: Regular rate and rhythm with no murmurs appreciated Pulm: Normal work of breathing, clear to auscultation bilaterally with no crackles, wheezes, or rhonchi Neuro: Grossly normal, moves all extremities Psych: Normal affect and thought content        I,Savera Zaman,acting as a scribe for Dimas Chyle, MD.,have documented all relevant documentation on the behalf of Dimas Chyle, MD,as directed by  Dimas Chyle, MD while in the presence of Dimas Chyle, MD.   *** Algis Greenhouse. Jerline Pain, MD 07/18/2021 8:14 AM

## 2021-07-28 ENCOUNTER — Ambulatory Visit (INDEPENDENT_AMBULATORY_CARE_PROVIDER_SITE_OTHER): Payer: Medicare Other | Admitting: Family Medicine

## 2021-07-28 ENCOUNTER — Encounter: Payer: Self-pay | Admitting: Family Medicine

## 2021-07-28 ENCOUNTER — Other Ambulatory Visit: Payer: Self-pay

## 2021-07-28 VITALS — BP 110/69 | HR 79 | Temp 98.5°F | Ht 75.0 in | Wt 205.6 lb

## 2021-07-28 DIAGNOSIS — R739 Hyperglycemia, unspecified: Secondary | ICD-10-CM | POA: Diagnosis not present

## 2021-07-28 DIAGNOSIS — Z23 Encounter for immunization: Secondary | ICD-10-CM | POA: Diagnosis not present

## 2021-07-28 DIAGNOSIS — J309 Allergic rhinitis, unspecified: Secondary | ICD-10-CM

## 2021-07-28 DIAGNOSIS — J90 Pleural effusion, not elsewhere classified: Secondary | ICD-10-CM

## 2021-07-28 MED ORDER — AZELASTINE HCL 0.1 % NA SOLN: 2.0000 | Freq: Two times a day (BID) | NASAL | 12 refills | Status: DC

## 2021-07-28 NOTE — Assessment & Plan Note (Signed)
Follows with cardiothoracic surgery.  Status post thoracocentesis. Will be having a repeat CT scan next year.

## 2021-07-28 NOTE — Assessment & Plan Note (Signed)
Check A1c next blood draw.

## 2021-07-28 NOTE — Assessment & Plan Note (Signed)
With postnasal drip.  Will start Astelin nasal spray.  Also start over-the-counter Claritin or Zyrtec.  He will let me know if not improving and we can refer to ENT.

## 2021-07-28 NOTE — Progress Notes (Signed)
   Danny Kerr is a 65 y.o. male who presents today for an office visit.  Assessment/Plan:  New/Acute Problems: COVID Recovered.    Chronic Problems Addressed Today: Allergic rhinitis With postnasal drip.  Will start Astelin nasal spray.  Also start over-the-counter Claritin or Zyrtec.  He will let me know if not improving and we can refer to ENT.  Hyperglycemia Check A1c next blood draw.  Pleural effusion Follows with cardiothoracic surgery.  Status post thoracocentesis. Will be having a repeat CT scan next year.  Flu shot given today.     Subjective:  HPI:  See A/P for status of chronic conditions.  Patient had COVID a couple of weeks ago.  Had some postnasal drip and rhinorrhea mild cough.  Symptoms have since improved.  Still has some cough.  Has a longstanding history of allergic rhinitis and postnasal drip.  Tried Mucinex with no improvement.       Objective:  Physical Exam: BP 110/69   Pulse 79   Temp 98.5 F (36.9 C) (Temporal)   Ht 6\' 3"  (1.905 m)   Wt 205 lb 9.6 oz (93.3 kg)   SpO2 98%   BMI 25.70 kg/m   Gen: No acute distress, resting comfortably HEENT: TMs clear. CV: Regular rate and rhythm with no murmurs appreciated Pulm: Normal work of breathing, clear to auscultation bilaterally with no crackles, wheezes, or rhonchi Neuro: Grossly normal, moves all extremities Psych: Normal affect and thought content      Erika Slaby M. Jerline Pain, MD 07/28/2021 12:27 PM

## 2021-07-28 NOTE — Patient Instructions (Signed)
It was very nice to see you today!  Please try the nasal spray. You can also try over-the-counter Claritin or Zyrtec.  Let me know if not improving.  I will see you back in February for your physical.  Come back to see me sooner if needed.  Take care, Dr Jerline Pain  PLEASE NOTE:  If you had any lab tests please let us know if you have not heard back within a few days. You may see your results on mychart before we have a chance to review them but we will give you a call once they are reviewed by [place]. If we ordered any referrals today, please let us know if you have not heard from their office within the next week.   Please try these tips to maintain a healthy lifestyle:  Eat at least 3 REAL meals and 1-2 snacks per day.  Aim for no more than 5 hours between eating.  If you eat breakfast, please do so within one hour of getting up.   Each meal should contain half fruits/vegetables, one quarter protein, and one quarter carbs (no bigger than a computer mouse)  Cut down on sweet beverages. This includes juice, soda, and sweet tea.   Drink at least 1 glass of water with each meal and aim for at least 8 glasses per day  Exercise at least 150 minutes every week.

## 2021-09-12 IMAGING — CT CT HEAD W/O CM
3 series · 15 of 47 positions shown, 18 images · non-contrast
Comparison: Head CT June 19, 2004, cervical spine CT July 23, 2017

CLINICAL DATA: Status post fall last night hit right forehead.

EXAM:
CT HEAD WITHOUT CONTRAST
CT CERVICAL SPINE WITHOUT CONTRAST
TECHNIQUE: Multidetector CT imaging of the head and cervical spine was
performed following the standard protocol without intravenous
contrast. Multiplanar CT image reconstructions of the cervical spine
were also generated.

[Series 3: head wo · axial · 0.47mm/px · z∈[-51,+84]mm · 9 of 33 slices shown, 12 images]
[im 3/33  brain]
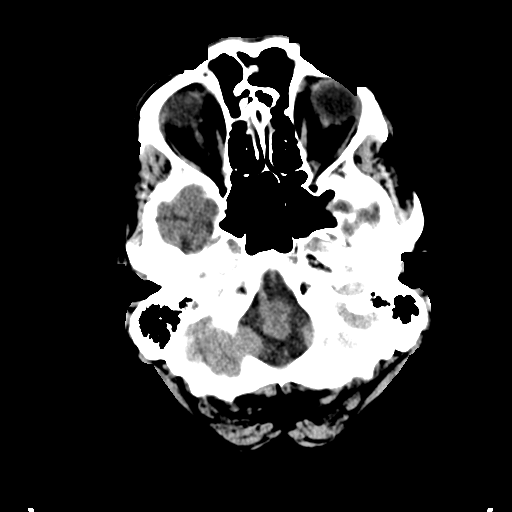
[im 3/33  bone]
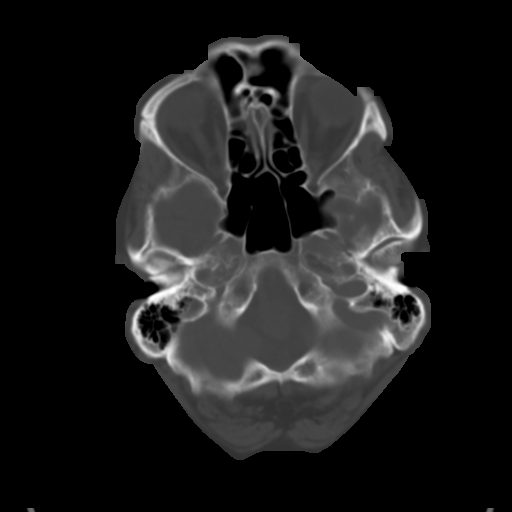
[im 6/33  brain]
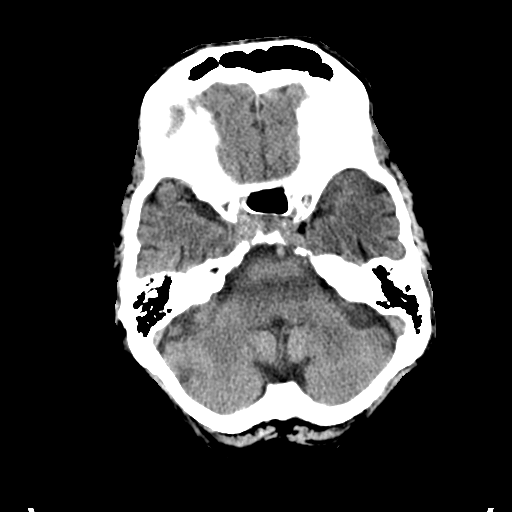
[im 9/33  brain]
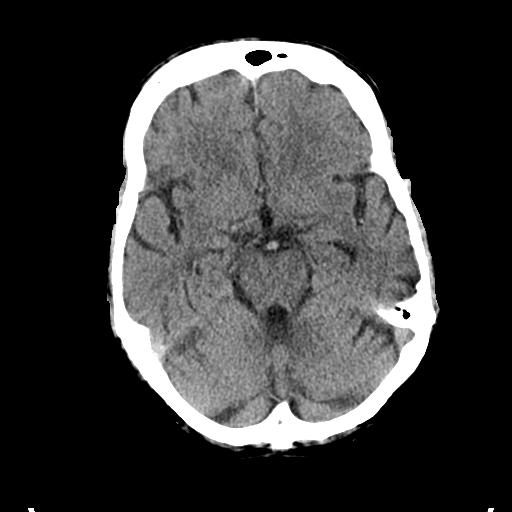
[im 13/33  brain]
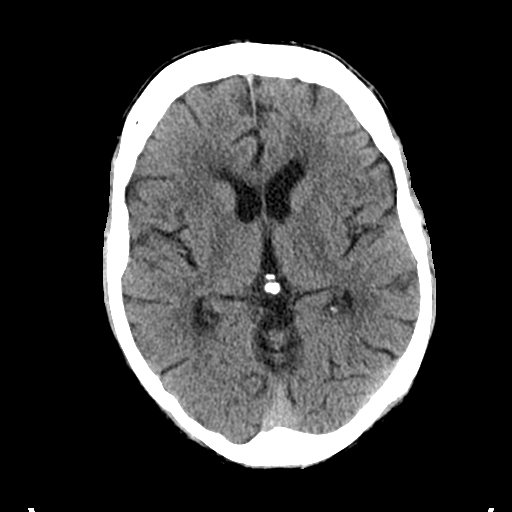
[im 17/33  brain]
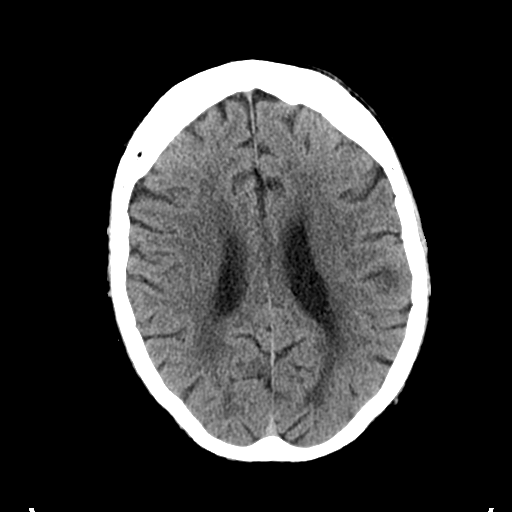
[im 17/33  bone]
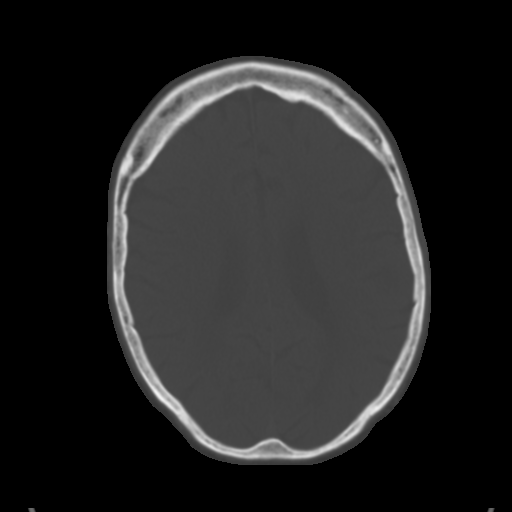
[im 20/33  brain]
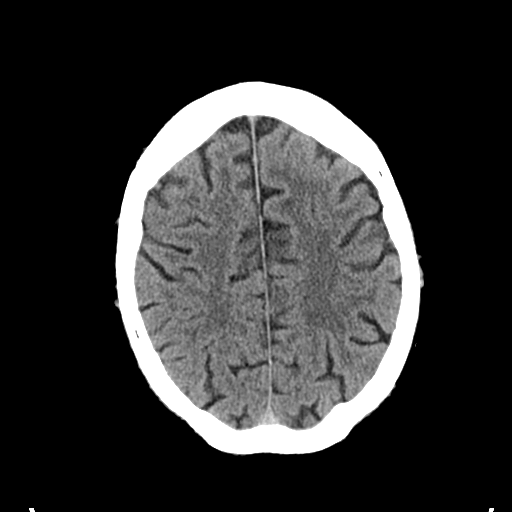
[im 24/33  brain]
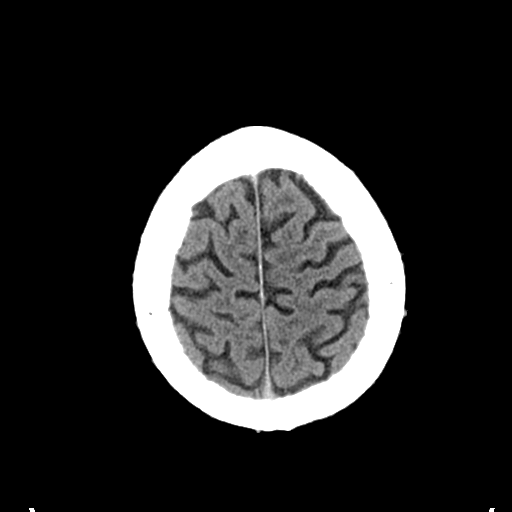
[im 27/33  brain]
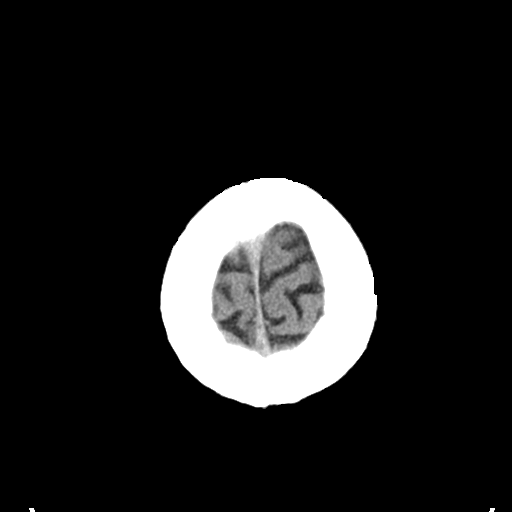
[im 30/33  brain]
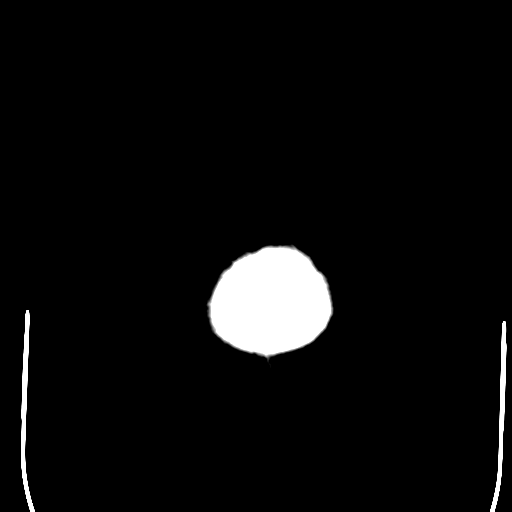
[im 30/33  bone]
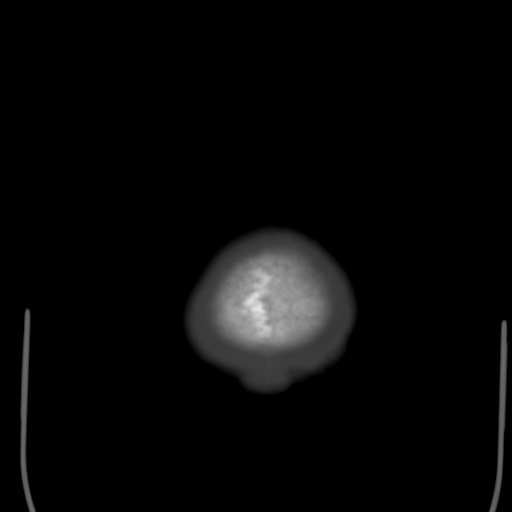

[Series 5: coronal soft tissue · coronal · 0.34mm/px · 3 of 84 slices shown]
[im 33/84  brain]
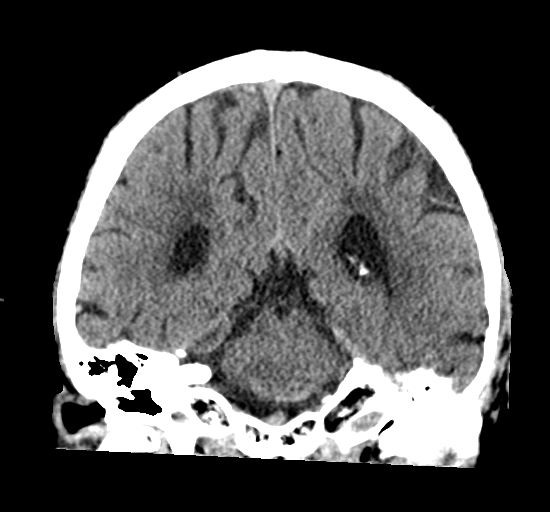
[im 39/84  brain]
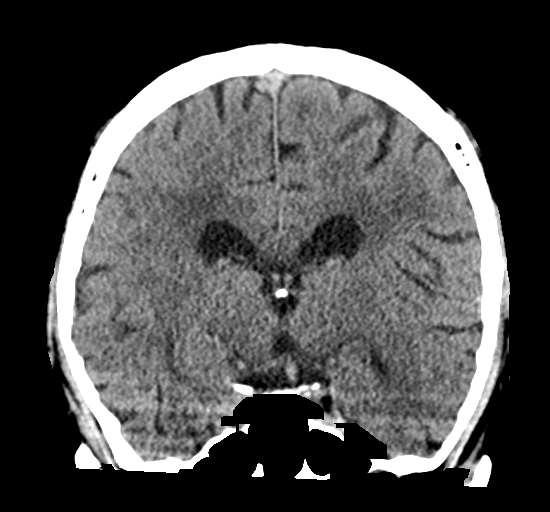
[im 45/84  brain]
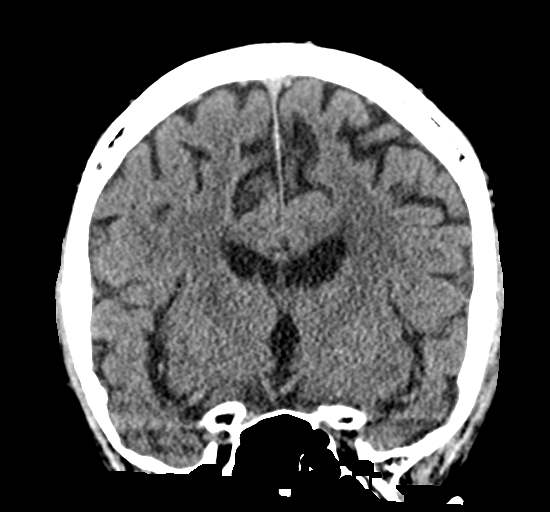

[Series 6: sagittal soft tissue · sagittal · 0.36mm/px · 3 of 62 slices shown]
[im 21/62  brain]
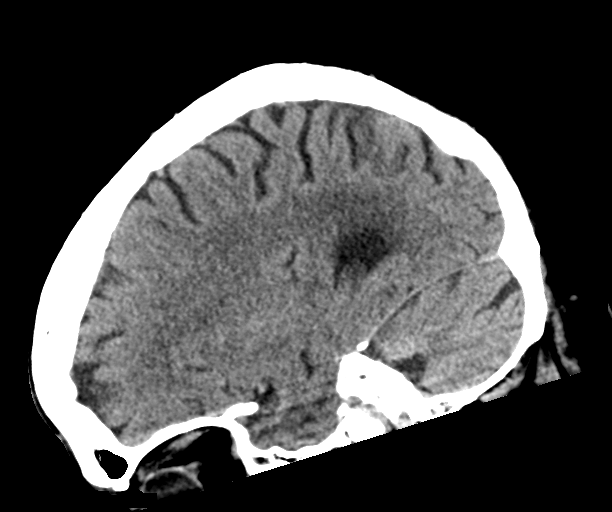
[im 31/62  brain]
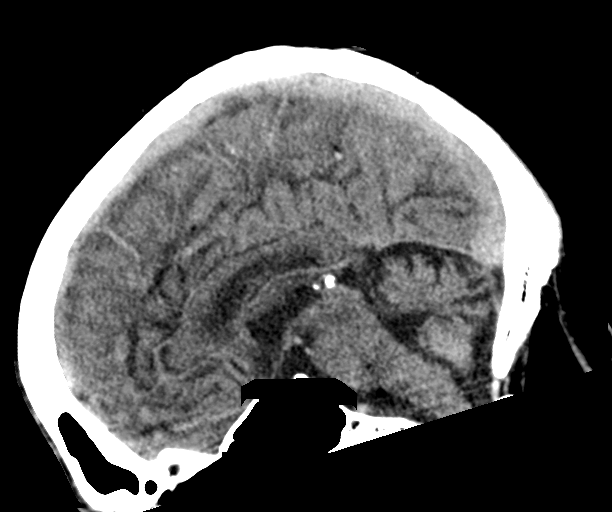
[im 41/62  brain]
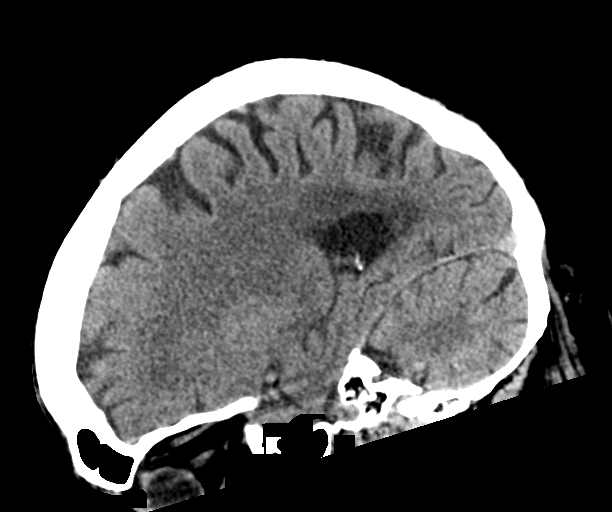

[15 of 47 positions shown; findings below may reference images not displayed]

FINDINGS: CT HEAD FINDINGS

Brain: No evidence of acute infarction, hemorrhage, hydrocephalus,
extra-axial collection or mass lesion/mass effect. There is chronic
diffuse atrophy. Chronic bilateral periventricular white matter
small vessel ischemic change is identified.

Vascular: No hyperdense vessel is noted.

Skull: Normal. Negative for fracture or focal lesion.

Sinuses/Orbits: No acute abnormality.

Other: None.

CT CERVICAL SPINE FINDINGS

Alignment: There is straightening of cervical spine either due to
positioning or muscle spasm.

Skull base and vertebrae: No acute fracture. No primary bone lesion
or focal pathologic process.

Soft tissues and spinal canal: No prevertebral fluid or swelling. No
visible canal hematoma.

Disc levels: Degenerative joint changes of mid to lower cervical
spine with narrowed joint space and osteophyte formation are noted.

Upper chest: Negative.

Other: None.
IMPRESSION: No focal acute intracranial abnormality identified.

No acute fracture or dislocation of cervical spine.

## 2021-11-20 ENCOUNTER — Encounter: Payer: Self-pay | Admitting: Family Medicine

## 2021-11-20 ENCOUNTER — Other Ambulatory Visit: Payer: Self-pay

## 2021-11-20 ENCOUNTER — Ambulatory Visit (INDEPENDENT_AMBULATORY_CARE_PROVIDER_SITE_OTHER): Payer: Medicare Other | Admitting: Family Medicine

## 2021-11-20 VITALS — BP 138/82 | HR 79 | Ht 75.0 in | Wt 205.0 lb

## 2021-11-20 DIAGNOSIS — R059 Cough, unspecified: Secondary | ICD-10-CM | POA: Diagnosis not present

## 2021-11-20 DIAGNOSIS — J329 Chronic sinusitis, unspecified: Secondary | ICD-10-CM | POA: Diagnosis not present

## 2021-11-20 LAB — POC COVID19 BINAXNOW: SARS Coronavirus 2 Ag: NEGATIVE

## 2021-11-20 LAB — POCT INFLUENZA A/B
Influenza A, POC: NEGATIVE
Influenza B, POC: NEGATIVE

## 2021-11-20 MED ORDER — PREDNISONE 50 MG PO TABS: ORAL_TABLET | ORAL | 0 refills | Status: DC

## 2021-11-20 MED ORDER — PROMETHAZINE-DM 6.25-15 MG/5ML PO SYRP: 5.0000 mL | ORAL_SOLUTION | Freq: Four times a day (QID) | ORAL | 0 refills | Status: DC | PRN

## 2021-11-20 MED ORDER — AZITHROMYCIN 250 MG PO TABS: ORAL_TABLET | ORAL | 0 refills | Status: DC

## 2021-11-20 NOTE — Progress Notes (Signed)
° °  Danny Kerr is a 66 y.o. male who presents today for an office visit.  Assessment/Plan:  Cough/sinusitis No red flags.  Given length of symptoms we will start antibiotics with azithromycin.  He will continue using Astelin.  He is having quite a bit of issue with cough.  We will start dextromethorphan-promethazine cough syrup.  Also start prednisone.  Encouraged hydration.  Can continue over-the-counter meds.  We discussed reasons to return to care.     Subjective:  HPI:  Patient here with cough and fatigue. Started a week ago. A lot of congestion. Symptoms are worsening. Some sore throat. Some sore abdominal wall with all the coughing.  Tried OTC meds without much improvement. Astelin has helped some. No fevers or chills.No known sick contacts.        Objective:  Physical Exam: BP 138/82    Pulse 79    Ht 6\' 3"  (1.905 m)    Wt 205 lb (93 kg)    SpO2 97%    BMI 25.62 kg/m   Gen: No acute distress, resting comfortably HEENT: TMs clear.  OP erythematous without exudate.  Nasal mucosa erythematous and boggy bilaterally with clear watery discharge. CV: Regular rate and rhythm with no murmurs appreciated Pulm: Normal work of breathing, clear to auscultation bilaterally with no crackles, wheezes, or rhonchi Neuro: Grossly normal, moves all extremities Psych: Normal affect and thought content      Arminda Foglio M. Jerline Pain, MD 11/20/2021 12:26 PM

## 2021-11-20 NOTE — Patient Instructions (Signed)
It was very nice to see you today!  I think you probably have a sinus infection.  Please start the prednisone and antibiotic.  Please continue with the nasal spray.  Use the cough syrup as needed.  Let me know if not proving over the next week or so.  Take care, Dr Jerline Pain  PLEASE NOTE:  If you had any lab tests please let us know if you have not heard back within a few days. You may see your results on mychart before we have a chance to review them but we will give you a call once they are reviewed by [place]. If we ordered any referrals today, please let us know if you have not heard from their office within the next week.   Please try these tips to maintain a healthy lifestyle:  Eat at least 3 REAL meals and 1-2 snacks per day.  Aim for no more than 5 hours between eating.  If you eat breakfast, please do so within one hour of getting up.   Each meal should contain half fruits/vegetables, one quarter protein, and one quarter carbs (no bigger than a computer mouse)  Cut down on sweet beverages. This includes juice, soda, and sweet tea.   Drink at least 1 glass of water with each meal and aim for at least 8 glasses per day  Exercise at least 150 minutes every week.

## 2021-11-29 ENCOUNTER — Telehealth: Payer: Self-pay | Admitting: Family Medicine

## 2021-11-29 NOTE — Telephone Encounter (Signed)
Copied from [place] [phone]. Topic: Medicare AWV >> Nov 29, 2021 10:04 AM Harris-Coley, Hannah Beat wrote: Reason for CRM: Left message for patient to schedule Annual Wellness Visit.  Please schedule with Nurse Health Advisor Charlott Rakes, RN at [facility].  Please call [phone] ask for [facility]

## 2021-12-01 ENCOUNTER — Ambulatory Visit: Payer: Medicare Other

## 2021-12-05 NOTE — Progress Notes (Signed)
Chief Complaint:  Danny Kerr is a 66 y.o. male who presents today for his annual comprehensive physical exam.    Assessment/Plan:  Chronic Problems Addressed Today: Dyslipidemia Check labs. Discussed lifestyle modifications.   Erectile dysfunction Discussed treatment options.  We will start sildenafil.  We discussed side effects.  He will follow-up in a few weeks via MyChart letting how this is working.  Hyperglycemia Check A1c.  Thrombocytopenia (HCC) Check CBC.   Preventative Healthcare: Will get blood work done today. Pneumonia vaccine given today.  Last colonoscopy 2016.  He will check to make sure this was recommended for 10-year follow-up.   Patient Counseling(The following topics were reviewed and/or handout was given):  -Nutrition: Stressed importance of moderation in sodium/caffeine intake, saturated fat and cholesterol, caloric balance, sufficient intake of fresh fruits, vegetables, and fiber.  -Stressed the importance of regular exercise.   -Substance Abuse: Discussed cessation/primary prevention of tobacco, alcohol, or other drug use; driving or other dangerous activities under the influence; availability of treatment for abuse.   -Injury prevention: Discussed safety belts, safety helmets, smoke detector, smoking near bedding or upholstery.   -Sexuality: Discussed sexually transmitted diseases, partner selection, use of condoms, avoidance of unintended pregnancy and contraceptive alternatives.   -Dental health: Discussed importance of regular tooth brushing, flossing, and dental visits.  -Health maintenance and immunizations reviewed. Please refer to Health maintenance section.  Return to care in 1 year for next preventative visit.     Subjective:  HPI:  He has no acute complaints today.   He is interested to start on Viagra for erectile dysfunction. He has no other concerns today. He has been doing well since last visit. Still have some sore throat due  to sinus infection.  Lifestyle Diet: None specific.  Exercise: Goes to gym.  Depression screen PHQ 2/9 12/07/2021  Decreased Interest 0  Down, Depressed, Hopeless 0  PHQ - 2 Score 0  Some encounter information is confidential and restricted. Go to Review Flowsheets activity to see all data.    Health Maintenance Due  Topic Date Due   Hepatitis C Screening  Never done   TETANUS/TDAP  Never done   Zoster Vaccines- Shingrix (1 of 2) Never done   Pneumonia Vaccine 71+ Years old (1 - PCV) Never done     ROS: Per HPI, otherwise a complete review of systems was negative.   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   Past Surgical History:  Procedure Laterality Date   COLONOSCOPY     FEMUR IM NAIL Left 07/16/2020   Procedure: INTRAMEDULLARY (IM) NAIL FEMORAL;  Surgeon: Altamese [place], MD;  Location: [place];  Service: Orthopedics;  Laterality: Left;   IR THORACENTESIS ASP PLEURAL SPACE W/IMG GUIDE  11/25/2020   KNEE ARTHROTOMY  1977   rt knee   RADIOLOGY WITH ANESTHESIA N/A 09/03/2017   Procedure: MIR  CERVICAL [name] CONSTRAST;  Surgeon: Radiologist, Medication, MD;  Location: [place];  Service: Radiology;  Laterality: N/A;   TOTAL KNEE ARTHROPLASTY Right 09/06/2014   Procedure: RIGHT TOTAL KNEE ARTHROPLASTY;  Surgeon: Gearlean Alf, MD;  Location: [facility];  Service: Orthopedics;  Laterality: Right;    Family  History  Problem Relation Age of Onset   Lung cancer Mother    Cancer Mother    Parkinson's disease Sister    Heart disease Sister     Medications- reviewed and updated Current Outpatient Medications  Medication Sig Dispense Refill   azelastine (ASTELIN) 0.1 % nasal spray Place 2 sprays into both nostrils 2 (two) times daily. 30 mL 12   sildenafil (VIAGRA) 100 MG tablet Take 0.5-1 tablets (50-100 mg total) by mouth daily as needed for erectile dysfunction. 5 tablet 11   No current facility-administered medications for this visit.    Allergies-reviewed and updated No Known Allergies  Social History   Socioeconomic History   Marital status: Legally Separated    Spouse name: Not on file   Number of children: Not on file   Years of education: Not on file   Highest education level: Not on file  Occupational History   Occupation: retired  Tobacco Use   Smoking status: Former    Years: 5.00    Types: Cigarettes    Quit date: 08/26/1989    Years since quitting: 32.3   Smokeless tobacco: Never  Vaping Use   Vaping Use: Never used  Substance and Sexual Activity   Alcohol use: Not Currently    Comment: heavy daily alcohol use   Drug use: No   Sexual activity: Not on file  Other Topics Concern   Not on file  Social History Narrative   ** Merged History Encounter **       Social Determinants of Health   Financial Resource Strain: Not on file  Food Insecurity: Not on file  Transportation Needs: Not on file  Physical Activity: Not on file  Stress: Not on file  Social Connections: Not on file        Objective:  Physical Exam: BP 111/74    Pulse 84    Temp 98.3 F (36.8 C) (Temporal)    Ht 6\' 3"  (1.905 m)    Wt 215 lb (97.5 kg)    SpO2 96%    BMI 26.87 kg/m   Body mass index is 26.87 kg/m. Wt Readings from Last 3 Encounters:  12/07/21 215 lb (97.5 kg)  11/20/21 205 lb (93 kg)  07/28/21 205 lb 9.6 oz (93.3 kg)   Gen: NAD, resting comfortably HEENT: TMs normal  bilaterally. OP clear. No thyromegaly noted.  CV: RRR with no murmurs appreciated Pulm: NWOB, CTAB with no crackles, wheezes, or rhonchi GI: Normal bowel sounds present. Soft, Nontender, Nondistended. MSK: no edema, cyanosis, or clubbing noted Skin: warm, dry Neuro: CN2-12 grossly intact. Strength 5/5 in upper and lower extremities. Reflexes symmetric and intact bilaterally.  Psych: Normal affect and thought content      I,Savera Zaman,acting as a scribe for Dimas Chyle, MD.,have documented all relevant documentation on the behalf of Dimas Chyle, MD,as directed by  Dimas Chyle, MD while in the presence of Dimas Chyle, MD.   I, Dimas Chyle, MD, have reviewed all documentation for this visit. The documentation on 12/07/21 for the exam, diagnosis, procedures, and orders are all accurate and complete.  Algis Greenhouse. Jerline Pain, MD 12/07/2021 11:30 AM

## 2021-12-07 ENCOUNTER — Ambulatory Visit (INDEPENDENT_AMBULATORY_CARE_PROVIDER_SITE_OTHER): Payer: Medicare Other | Admitting: Family Medicine

## 2021-12-07 ENCOUNTER — Encounter: Payer: Self-pay | Admitting: Family Medicine

## 2021-12-07 ENCOUNTER — Other Ambulatory Visit: Payer: Self-pay

## 2021-12-07 VITALS — BP 111/74 | HR 84 | Temp 98.3°F | Ht 75.0 in | Wt 215.0 lb

## 2021-12-07 DIAGNOSIS — R739 Hyperglycemia, unspecified: Secondary | ICD-10-CM

## 2021-12-07 DIAGNOSIS — R351 Nocturia: Secondary | ICD-10-CM

## 2021-12-07 DIAGNOSIS — N529 Male erectile dysfunction, unspecified: Secondary | ICD-10-CM

## 2021-12-07 DIAGNOSIS — Z23 Encounter for immunization: Secondary | ICD-10-CM | POA: Diagnosis not present

## 2021-12-07 DIAGNOSIS — D696 Thrombocytopenia, unspecified: Secondary | ICD-10-CM | POA: Diagnosis not present

## 2021-12-07 DIAGNOSIS — E785 Hyperlipidemia, unspecified: Secondary | ICD-10-CM | POA: Insufficient documentation

## 2021-12-07 LAB — LIPID PANEL
Cholesterol: 160 mg/dL (ref 0–200)
HDL: 42.6 mg/dL (ref >39.00)
LDL Cholesterol: 102 mg/dL — ABNORMAL HIGH (ref 0–99)
NonHDL: 117.54
Total CHOL/HDL Ratio: 4
Triglycerides: 80 mg/dL (ref 0.0–149.0)
VLDL: 16 mg/dL (ref 0.0–40.0)

## 2021-12-07 LAB — PSA: PSA: 1.37 ng/mL (ref 0.10–4.00)

## 2021-12-07 LAB — COMPREHENSIVE METABOLIC PANEL
ALT: 5 U/L (ref 0–53)
AST: 13 U/L (ref 0–37)
Albumin: 4.3 g/dL (ref 3.5–5.2)
Alkaline Phosphatase: 139 U/L — ABNORMAL HIGH (ref 39–117)
BUN: 10 mg/dL (ref 6–23)
CO2: 31 mEq/L (ref 19–32)
Calcium: 9.6 mg/dL (ref 8.4–10.5)
Chloride: 101 mEq/L (ref 96–112)
Creatinine, Ser: 0.75 mg/dL (ref 0.40–1.50)
GFR: 94.08 mL/min (ref >60.00)
Glucose, Bld: 93 mg/dL (ref 70–99)
Potassium: 4.2 mEq/L (ref 3.5–5.1)
Sodium: 136 mEq/L (ref 135–145)
Total Bilirubin: 0.9 mg/dL (ref 0.2–1.2)
Total Protein: 7 g/dL (ref 6.0–8.3)

## 2021-12-07 LAB — CBC
HCT: 44.7 % (ref 39.0–52.0)
Hemoglobin: 15 g/dL (ref 13.0–17.0)
MCHC: 33.5 g/dL (ref 30.0–36.0)
MCV: 86.6 fl (ref 78.0–100.0)
Platelets: 321 10*3/uL (ref 150.0–400.0)
RBC: 5.16 Mil/uL (ref 4.22–5.81)
RDW: 13.8 % (ref 11.5–15.5)
WBC: 7.1 10*3/uL (ref 4.0–10.5)

## 2021-12-07 LAB — TSH: TSH: 1.27 u[IU]/mL (ref 0.35–5.50)

## 2021-12-07 LAB — HEMOGLOBIN A1C: Hgb A1c MFr Bld: 5.9 % (ref 4.6–6.5)

## 2021-12-07 MED ORDER — SILDENAFIL CITRATE 100 MG PO TABS: 50.0000 mg | ORAL_TABLET | Freq: Every day | ORAL | 11 refills | Status: AC | PRN

## 2021-12-07 NOTE — Assessment & Plan Note (Signed)
Check A1c. 

## 2021-12-07 NOTE — Addendum Note (Signed)
Addended by: Betti Cruz on: 12/07/2021 12:01 PM   Modules accepted: Orders

## 2021-12-07 NOTE — Patient Instructions (Signed)
It was very nice to see you today!  We will give your pneumonia vaccine today.  We will check blood work.  Please try the Cialis.  I will see you back in year for your next annual physical.  Please come back to see me sooner if needed.  Take care, Dr Jerline Pain  PLEASE NOTE:  If you had any lab tests please let us know if you have not heard back within a few days. You may see your results on mychart before we have a chance to review them but we will give you a call once they are reviewed by [place]. If we ordered any referrals today, please let us know if you have not heard from their office within the next week.   Please try these tips to maintain a healthy lifestyle:  Eat at least 3 REAL meals and 1-2 snacks per day.  Aim for no more than 5 hours between eating.  If you eat breakfast, please do so within one hour of getting up.   Each meal should contain half fruits/vegetables, one quarter protein, and one quarter carbs (no bigger than a computer mouse)  Cut down on sweet beverages. This includes juice, soda, and sweet tea.   Drink at least 1 glass of water with each meal and aim for at least 8 glasses per day  Exercise at least 150 minutes every week.

## 2021-12-07 NOTE — Assessment & Plan Note (Signed)
Check labs.  Discussed lifestyle modifications. 

## 2021-12-07 NOTE — Assessment & Plan Note (Signed)
Discussed treatment options.  We will start sildenafil.  We discussed side effects.  He will follow-up in a few weeks via MyChart letting how this is working.

## 2021-12-07 NOTE — Assessment & Plan Note (Signed)
Check CBC 

## 2021-12-08 ENCOUNTER — Ambulatory Visit: Payer: Medicare Other

## 2021-12-08 NOTE — Progress Notes (Signed)
Please inform patient of the following:  Blood sugar and cholesterol are both borderline but stable. Do not need to make any changes to his treatment plan at this time. He should continue to work on diet and exercise and we can recheck in a year.

## 2021-12-18 ENCOUNTER — Ambulatory Visit: Payer: Medicare Other

## 2021-12-22 ENCOUNTER — Other Ambulatory Visit: Payer: Self-pay

## 2021-12-22 ENCOUNTER — Ambulatory Visit (INDEPENDENT_AMBULATORY_CARE_PROVIDER_SITE_OTHER): Payer: Medicare Other

## 2021-12-22 DIAGNOSIS — Z0000 Encounter for general adult medical examination without abnormal findings: Secondary | ICD-10-CM | POA: Diagnosis not present

## 2021-12-22 NOTE — Patient Instructions (Signed)
[name] , Thank you for taking time to come for your Medicare Wellness Visit. I appreciate your ongoing commitment to your health goals. Please review the following plan we discussed and let me know if I can assist you in the future.   Screening recommendations/referrals: Colonoscopy: Done 02/20/15 repeat every 10 years  Recommended yearly ophthalmology/optometry visit for glaucoma screening and checkup Recommended yearly dental visit for hygiene and checkup  Vaccinations: Influenza vaccine: Done 07/28/21 repeat every year  Pneumococcal vaccine: Up to date Tdap vaccine: Done 12/06/15 repeat every 10 years  Shingles vaccine: Shingrix discussed. Please contact your pharmacy for coverage information.    Covid-19: Completed 3/29, 5/4, 10/03/20  Advanced directives: Please bring a copy of your health care power of attorney and living will to the office at your convenience.  Conditions/risks identified: None at this time   Next appointment: Follow up in one year for your annual wellness visit.   Preventive Care 11 Years and Older, Male Preventive care refers to lifestyle choices and visits with your health care provider that can promote health and wellness. What does preventive care include? A yearly physical exam. This is also called an annual well check. Dental exams once or twice a year. Routine eye exams. Ask your health care provider how often you should have your eyes checked. Personal lifestyle choices, including: Daily care of your teeth and gums. Regular physical activity. Eating a healthy diet. Avoiding tobacco and drug use. Limiting alcohol use. Practicing safe sex. Taking low doses of aspirin every day. Taking vitamin and mineral supplements as recommended by your health care provider. What happens during an annual well check? The services and screenings done by your health care provider during your annual well check will depend on your age, overall health, lifestyle risk  factors, and family history of disease. Counseling  Your health care provider may ask you questions about your: Alcohol use. Tobacco use. Drug use. Emotional well-being. Home and relationship well-being. Sexual activity. Eating habits. History of falls. Memory and ability to understand (cognition). Work and work Statistician. Screening  You may have the following tests or measurements: Height, weight, and BMI. Blood pressure. Lipid and cholesterol levels. These may be checked every 5 years, or more frequently if you are over 46 years old. Skin check. Lung cancer screening. You may have this screening every year starting at age 29 if you have a 30-pack-year history of smoking and currently smoke or have quit within the past 15 years. Fecal occult blood test (FOBT) of the stool. You may have this test every year starting at age 46. Flexible sigmoidoscopy or colonoscopy. You may have a sigmoidoscopy every 5 years or a colonoscopy every 10 years starting at age 62. Prostate cancer screening. Recommendations will vary depending on your family history and other risks. Hepatitis C blood test. Hepatitis B blood test. Sexually transmitted disease (STD) testing. Diabetes screening. This is done by checking your blood sugar (glucose) after you have not eaten for a while (fasting). You may have this done every 1-3 years. Abdominal aortic aneurysm (AAA) screening. You may need this if you are a current or former smoker. Osteoporosis. You may be screened starting at age 59 if you are at high risk. Talk with your health care provider about your test results, treatment options, and if necessary, the need for more tests. Vaccines  Your health care provider may recommend certain vaccines, such as: Influenza vaccine. This is recommended every year. Tetanus, diphtheria, and acellular pertussis (Tdap, Td)  vaccine. You may need a Td booster every 10 years. Zoster vaccine. You may need this after age  59. Pneumococcal 13-valent conjugate (PCV13) vaccine. One dose is recommended after age 35. Pneumococcal polysaccharide (PPSV23) vaccine. One dose is recommended after age 18. Talk to your health care provider about which screenings and vaccines you need and how often you need them. This information is not intended to replace advice given to you by your health care provider. Make sure you discuss any questions you have with your health care provider. Document Released: 11/04/2015 Document Revised: 06/27/2016 Document Reviewed: 08/09/2015 Elsevier Interactive Patient Education  2017 [place] Prevention in the Home Falls can cause injuries. They can happen to people of all ages. There are many things you can do to make your home safe and to help prevent falls. What can I do on the outside of my home? Regularly fix the edges of walkways and driveways and fix any cracks. Remove anything that might make you trip as you walk through a door, such as a raised step or threshold. Trim any bushes or trees on the path to your home. Use bright outdoor lighting. Clear any walking paths of anything that might make someone trip, such as rocks or tools. Regularly check to see if handrails are loose or broken. Make sure that both sides of any steps have handrails. Any raised decks and porches should have guardrails on the edges. Have any leaves, snow, or ice cleared regularly. Use sand or salt on walking paths during winter. Clean up any spills in your garage right away. This includes oil or grease spills. What can I do in the bathroom? Use night lights. Install grab bars by the toilet and in the tub and shower. Do not use towel bars as grab bars. Use non-skid mats or decals in the tub or shower. If you need to sit down in the shower, use a plastic, non-slip stool. Keep the floor dry. Clean up any water that spills on the floor as soon as it happens. Remove soap buildup in the tub or shower  regularly. Attach bath mats securely with double-sided non-slip rug tape. Do not have throw rugs and other things on the floor that can make you trip. What can I do in the bedroom? Use night lights. Make sure that you have a light by your bed that is easy to reach. Do not use any sheets or blankets that are too big for your bed. They should not hang down onto the floor. Have a firm chair that has side arms. You can use this for support while you get dressed. Do not have throw rugs and other things on the floor that can make you trip. What can I do in the kitchen? Clean up any spills right away. Avoid walking on wet floors. Keep items that you use a lot in easy-to-reach places. If you need to reach something above you, use a strong step stool that has a grab bar. Keep electrical cords out of the way. Do not use floor polish or wax that makes floors slippery. If you must use wax, use non-skid floor wax. Do not have throw rugs and other things on the floor that can make you trip. What can I do with my stairs? Do not leave any items on the stairs. Make sure that there are handrails on both sides of the stairs and use them. Fix handrails that are broken or loose. Make sure that handrails are as long  as the stairways. Check any carpeting to make sure that it is firmly attached to the stairs. Fix any carpet that is loose or worn. Avoid having throw rugs at the top or bottom of the stairs. If you do have throw rugs, attach them to the floor with carpet tape. Make sure that you have a light switch at the top of the stairs and the bottom of the stairs. If you do not have them, ask someone to add them for you. What else can I do to help prevent falls? Wear shoes that: Do not have high heels. Have rubber bottoms. Are comfortable and fit you well. Are closed at the toe. Do not wear sandals. If you use a stepladder: Make sure that it is fully opened. Do not climb a closed stepladder. Make sure that  both sides of the stepladder are locked into place. Ask someone to hold it for you, if possible. Clearly mark and make sure that you can see: Any grab bars or handrails. First and last steps. Where the edge of each step is. Use tools that help you move around (mobility aids) if they are needed. These include: Canes. Walkers. Scooters. Crutches. Turn on the lights when you go into a dark area. Replace any light bulbs as soon as they burn out. Set up your furniture so you have a clear path. Avoid moving your furniture around. If any of your floors are uneven, fix them. If there are any pets around you, be aware of where they are. Review your medicines with your doctor. Some medicines can make you feel dizzy. This can increase your chance of falling. Ask your doctor what other things that you can do to help prevent falls. This information is not intended to replace advice given to you by your health care provider. Make sure you discuss any questions you have with your health care provider. Document Released: 08/04/2009 Document Revised: 03/15/2016 Document Reviewed: 11/12/2014 Elsevier Interactive Patient Education  2017 [employer].

## 2021-12-22 NOTE — Progress Notes (Addendum)
Virtual Visit via Telephone Note  I connected with  Danny Kerr on 12/22/21 at  2:30 PM EST by telephone and verified that I am speaking with the correct person using two identifiers.  Medicare Annual Wellness visit completed telephonically due to Covid-19 pandemic.   Persons participating in this call: This Health Coach and this patient.   Location: Patient: Home Provider: Office    I discussed the limitations, risks, security and privacy concerns of performing an evaluation and management service by telephone and the availability of in person appointments. The patient expressed understanding and agreed to proceed.  Unable to perform video visit due to video visit attempted and failed and/or patient does not have video capability.   Some vital signs may be absent or patient reported.   Willette Brace, LPN   Subjective:   Danny Kerr is a 66 y.o. male who presents for an Initial Medicare Annual Wellness Visit.  Review of Systems     Cardiac Risk Factors include: advanced age (>11men, >39 women);dyslipidemia;male gender     Objective:    There were no vitals filed for this visit. There is no height or weight on file to calculate BMI.  Advanced Directives 12/22/2021 10/26/2020 06/27/2020 06/27/2020 02/03/2020 12/12/2019 12/12/2019  Does Patient Have a Medical Advance Directive? Yes Yes No No No No No  Type of Paramedic of [place];Living will - - - - -  Does patient want to make changes to medical advance directive? - - - - - - -  Copy of [place] in Chart? No - copy requested Yes - validated most recent copy scanned in chart (See row information) - - - - -  Would patient like information on creating a medical advance directive? - - No - Patient declined No - Patient declined - No - Patient declined -    Current Medications (verified) Outpatient Encounter Medications as of 12/22/2021  Medication  Sig   azelastine (ASTELIN) 0.1 % nasal spray Place 2 sprays into both nostrils 2 (two) times daily.   sildenafil (VIAGRA) 100 MG tablet Take 0.5-1 tablets (50-100 mg total) by mouth daily as needed for erectile dysfunction.   No facility-administered encounter medications on file as of 12/22/2021.    Allergies (verified) Patient has no known allergies.   History: Past Medical History:  Diagnosis Date   Arrhythmia    Arthritis    Displaced segmental fracture of shaft of left femur, initial encounter for closed fracture ([facility]) 07/16/2020   ETOH abuse    Pneumonia    Rosacea    Seasonal allergies    Past Surgical History:  Procedure Laterality Date   COLONOSCOPY     FEMUR IM NAIL Left 07/16/2020   Procedure: INTRAMEDULLARY (IM) NAIL FEMORAL;  Surgeon: Altamese Ridott, MD;  Location: [facility];  Service: Orthopedics;  Laterality: Left;   IR THORACENTESIS ASP PLEURAL SPACE W/IMG GUIDE  11/25/2020   KNEE ARTHROTOMY  1977   rt knee   RADIOLOGY WITH ANESTHESIA N/A 09/03/2017   Procedure: MIR CERVICAL [name] CONSTRAST;  Surgeon: Radiologist, Medication, MD;  Location: [facility];  Service: Radiology;  Laterality: N/A;   TOTAL KNEE ARTHROPLASTY Right 09/06/2014   Procedure: RIGHT TOTAL KNEE ARTHROPLASTY;  Surgeon: Gearlean Alf, MD;  Location: [facility];  Service: Orthopedics;  Laterality: Right;   Family History  Problem Relation Age of Onset   Lung cancer Mother    Cancer Mother  Parkinson's disease Sister    Heart disease Sister    Social History   Socioeconomic History   Marital status: Legally Separated    Spouse name: Not on file   Number of children: Not on file   Years of education: Not on file   Highest education level: Not on file  Occupational History   Occupation: retired  Tobacco Use   Smoking status: Former    Years: 5.00    Types: Cigarettes    Quit date: 08/26/1989    Years since quitting: 32.3   Smokeless tobacco: Never  Vaping Use   Vaping Use: Never used   Substance and Sexual Activity   Alcohol use: Not Currently    Comment: heavy daily alcohol use   Drug use: No   Sexual activity: Not on file  Other Topics Concern   Not on file  Social History Narrative   ** Merged History Encounter **       Social Determinants of Health   Financial Resource Strain: Low Risk    Difficulty of Paying Living Expenses: Not hard at all  Food Insecurity: No Food Insecurity   Worried About Charity fundraiser in the Last Year: Never true   [place] in the Last Year: Never true  Transportation Needs: No Transportation Needs   Lack of Transportation (Medical): No   Lack of Transportation (Non-Medical): No  Physical Activity: Sufficiently Active   Days of Exercise per Week: 5 days   Minutes of Exercise per Session: 60 min  Stress: No Stress Concern Present   Feeling of Stress : Not at all  Social Connections: Moderately Isolated   Frequency of Communication with Friends and Family: More than three times a week   Frequency of Social Gatherings with Friends and Family: More than three times a week   Attends Religious Services: More than 4 times per year   Active Member of [employer] or Organizations: No   Attends Archivist Meetings: Never   Marital Status: Separated    Tobacco Counseling Counseling given: Not Answered   Clinical Intake:  Pre-visit preparation completed: Yes  Pain : No/denies pain     BMI - recorded: 26.87 Nutritional Risks: None Diabetes: No  How often do you need to have someone help you when you read instructions, pamphlets, or other written materials from your doctor or pharmacy?: 1 - Never  Diabetic?no  Interpreter Needed?: No  Information entered by :: Charlott Rakes, LPN   Activities of Daily Living In your present state of health, do you have any difficulty performing the following activities: 12/22/2021  Hearing? N  Vision? N  Difficulty concentrating or making decisions? N  Walking or  climbing stairs? N  Dressing or bathing? N  Doing errands, shopping? N  Preparing Food and eating ? N  Using the Toilet? N  In the past six months, have you accidently leaked urine? N  Do you have problems with loss of bowel control? N  Managing your Medications? N  Managing your Finances? N  Housekeeping or managing your Housekeeping? N  Some recent data might be hidden    Patient Care Team: [name], MD as PCP - General (Family Medicine) O'Neal, Cassie Freer, MD as PCP - Cardiology (Internal Medicine) [name], MD (Family Medicine)  Indicate any recent Medical Services you may have received from other than [facility] providers in the past year (date may be approximate).     Assessment:   This  is a routine wellness examination for Nitin.  Hearing/Vision screen Hearing Screening - Comments:: Pt denies any hearing issues  Vision Screening - Comments:: Pt follows up with Dr Gershon Crane for [place]   Dietary issues and exercise activities discussed: Current Exercise Habits: Home exercise routine, Type of exercise: Other - see comments, Time (Minutes): 60, Frequency (Times/Week): 5, Weekly Exercise (Minutes/Week): 300   Goals Addressed             This Visit's Progress    Patient Stated       None at this time        Depression Screen PHQ 2/9 Scores 12/22/2021 12/07/2021 07/28/2021 10/11/2020  PHQ - 2 Score 0 0 0 0  Some encounter information is confidential and restricted. Go to Review Flowsheets activity to see all data.    Fall Risk Fall Risk  12/22/2021 12/07/2021  Falls in the past year? 0 0  Number falls in past yr: 0 0  Injury with Fall? 0 0  Risk for fall due to : Impaired vision No Fall Risks  Follow up Falls prevention discussed -    FALL RISK PREVENTION PERTAINING TO THE HOME:  Any stairs in or around the home? No  If so, are there any without handrails? No  Home free of loose throw rugs in walkways, pet beds, electrical cords, etc? Yes   Adequate lighting in your home to reduce risk of falls? Yes   ASSISTIVE DEVICES UTILIZED TO PREVENT FALLS:  Life alert? No  Use of a cane, walker or w/c? No  Grab bars in the bathroom? Yes  Shower chair or bench in shower? No  Elevated toilet seat or a handicapped toilet? No   TIMED UP AND GO:  Was the test performed? No .   Cognitive Function:     6CIT Screen 12/22/2021  What Year? 0 points  What month? 0 points  What time? 0 points  Count back from 20 0 points  Months in reverse 0 points  Repeat phrase 0 points  Total Score 0    Immunizations Immunization History  Administered Date(s) Administered   Fluad Quad(high Dose 65+) 07/28/2021   Influenza,inj,Quad PF,6+ Mos 08/05/2019   Influenza,inj,quad, With Preservative 07/22/2017   PFIZER(Purple Top)SARS-COV-2 Vaccination 01/18/2020, 02/23/2020, 10/03/2020   PNEUMOCOCCAL CONJUGATE-20 12/07/2021   [place] Bivalent Pediatric Vaccine(50mos to <33yrs) 11/02/2021   Td 12/06/2015    TDAP status: Up to date  Flu Vaccine status: Up to date  Pneumococcal vaccine status: Up to date  Covid-19 vaccine status: Completed vaccines  Qualifies for Shingles Vaccine? Yes   Zostavax completed No   Shingrix Completed?: No.    Education has been provided regarding the importance of this vaccine. Patient has been advised to call insurance company to determine out of pocket expense if they have not yet received this vaccine. Advised may also receive vaccine at local pharmacy or Health Dept. Verbalized acceptance and understanding.  Screening Tests Health Maintenance  Topic Date Due   Hepatitis C Screening  Never done   Zoster Vaccines- Shingrix (1 of 2) Never done   COLONOSCOPY (Pts 45-14yrs Insurance coverage will need to be confirmed)  11/22/2024   TETANUS/TDAP  12/05/2025   Pneumonia Vaccine 58+ Years old  Completed   INFLUENZA VACCINE  Completed   COVID-19 Vaccine  Completed   HPV VACCINES  Aged Out    Health  Maintenance  Health Maintenance Due  Topic Date Due   Hepatitis C Screening  Never done  Zoster Vaccines- Shingrix (1 of 2) Never done     Colorectal cancer screening: Type of screening: Colonoscopy. Completed 11/22/14. Repeat every 10 years  Additional Screening:  Hepatitis C Screening: does qualify;  Vision Screening: Recommended annual ophthalmology exams for early detection of glaucoma and other disorders of the eye. Is the patient up to date with their annual eye exam?  Yes  Who is the provider or what is the name of the office in which the patient attends annual eye exams? Dr Gershon Crane  If pt is not established with a provider, would they like to be referred to a provider to establish care? No .   Dental Screening: Recommended annual dental exams for proper oral hygiene  Community Resource Referral / Chronic Care Management: CRR required this visit?  No   CCM required this visit?  No      Plan:     I have personally reviewed and noted the following in the patients chart:   Medical and social history Use of alcohol, tobacco or illicit drugs  Current medications and supplements including opioid prescriptions. Patient is not currently taking opioid prescriptions. Functional ability and status Nutritional status Physical activity Advanced directives List of other physicians Hospitalizations, surgeries, and ER visits in previous 12 months Vitals Screenings to include cognitive, depression, and falls Referrals and appointments  In addition, I have reviewed and discussed with patient certain preventive protocols, quality metrics, and best practice recommendations. A written personalized care plan for preventive services as well as general preventive health recommendations were provided to patient.     Willette Brace, LPN   12/24/7423   Nurse Notes: None

## 2022-01-08 ENCOUNTER — Ambulatory Visit (INDEPENDENT_AMBULATORY_CARE_PROVIDER_SITE_OTHER): Payer: Medicare Other | Admitting: Family Medicine

## 2022-01-08 ENCOUNTER — Encounter: Payer: Self-pay | Admitting: Family Medicine

## 2022-01-08 VITALS — BP 116/78 | HR 100 | Temp 98.8°F | Ht 75.0 in | Wt 222.0 lb

## 2022-01-08 DIAGNOSIS — J309 Allergic rhinitis, unspecified: Secondary | ICD-10-CM | POA: Diagnosis not present

## 2022-01-08 MED ORDER — AZELASTINE HCL 0.1 % NA SOLN: 2.0000 | Freq: Two times a day (BID) | NASAL | 12 refills | Status: DC

## 2022-01-08 NOTE — Patient Instructions (Signed)
It was very nice to see you today! ? ?Please restart the Astelin.  you should take this with Flonase.  Let me know if your symptoms are not improving in the next couple of weeks. ? ?Take care, ?Dr Jerline Pain ? ?PLEASE NOTE: ? ?If you had any lab tests please let us know if you have not heard back within a few days. You may see your results on mychart before we have a chance to review them but we will give you a call once they are reviewed by [place]. If we ordered any referrals today, please let us know if you have not heard from their office within the next week.  ? ?Please try these tips to maintain a healthy lifestyle: ? ?Eat at least 3 REAL meals and 1-2 snacks per day.  Aim for no more than 5 hours between eating.  If you eat breakfast, please do so within one hour of getting up.  ? ?Each meal should contain half fruits/vegetables, one quarter protein, and one quarter carbs (no bigger than a computer mouse) ? ?Cut down on sweet beverages. This includes juice, soda, and sweet tea.  ? ?Drink at least 1 glass of water with each meal and aim for at least 8 glasses per day ? ?Exercise at least 150 minutes every week.   ?

## 2022-01-08 NOTE — Progress Notes (Signed)
? ?  Danny Kerr is a 66 y.o. male who presents today for an office visit. ? ?Assessment/Plan:  ?Chronic Problems Addressed Today: ?Allergic rhinitis ?No red flags.  Not controlled.  We will restart Astelin nasal spray.  We will taking combination of Flonase.  He will let me know if not improving in the next few weeks.  Would consider addition of Singulair if not improving.  We discussed reasons to return to care.  ? ? ?  ?Subjective:  ?HPI: ? ?See A/P for status of chronic conditions.  He has had ongoing postnasal drip that seems to worsen recently.  This has been going on for several years.  He has been on Astelin in the past.  Does not member how it works. ? ?   ?  ?Objective:  ?Physical Exam: ?BP 116/78 (BP Location: Left Arm)   Pulse 100   Temp 98.8 ?F (37.1 ?C) (Temporal)   Ht '6\' 3"'$  (1.905 m)   Wt 222 lb (100.7 kg)   SpO2 98%   BMI 27.75 kg/m?   ?Gen: No acute distress, resting comfortably ?HEENT: TMs with clear effusion.  OP erythematous.  Nasal mucosa erythematous and boggy with clear discharge. ?CV: Regular rate and rhythm with no murmurs appreciated ?Pulm: Normal work of breathing, clear to auscultation bilaterally with no crackles, wheezes, or rhonchi ?Neuro: Grossly normal, moves all extremities ?Psych: Normal affect and thought content ? ?   ? ?Algis Greenhouse. Jerline Pain, MD ?01/08/2022 2:23 PM  ?

## 2022-01-08 NOTE — Assessment & Plan Note (Signed)
No red flags.  Not controlled.  We will restart Astelin nasal spray.  We will taking combination of Flonase.  He will let me know if not improving in the next few weeks.  Would consider addition of Singulair if not improving.  We discussed reasons to return to care.  ?

## 2022-02-14 ENCOUNTER — Other Ambulatory Visit: Payer: Self-pay | Admitting: Thoracic Surgery (Cardiothoracic Vascular Surgery)

## 2022-02-14 DIAGNOSIS — R918 Other nonspecific abnormal finding of lung field: Secondary | ICD-10-CM

## 2022-03-07 ENCOUNTER — Ambulatory Visit
Admission: RE | Admit: 2022-03-07 | Discharge: 2022-03-07 | Disposition: A | Discharge status: Home / Self Care | Payer: Medicare Other | Source: Ambulatory Visit | Attending: Thoracic Surgery (Cardiothoracic Vascular Surgery) | Admitting: Thoracic Surgery (Cardiothoracic Vascular Surgery)

## 2022-03-07 ENCOUNTER — Other Ambulatory Visit: Payer: Medicare Other

## 2022-03-07 DIAGNOSIS — R918 Other nonspecific abnormal finding of lung field: Secondary | ICD-10-CM

## 2022-03-13 ENCOUNTER — Encounter: Payer: Self-pay | Admitting: Thoracic Surgery (Cardiothoracic Vascular Surgery)

## 2022-03-13 ENCOUNTER — Ambulatory Visit (INDEPENDENT_AMBULATORY_CARE_PROVIDER_SITE_OTHER): Payer: Medicare Other | Admitting: Thoracic Surgery (Cardiothoracic Vascular Surgery)

## 2022-03-13 VITALS — BP 116/73 | HR 87 | Resp 20 | Ht 75.0 in | Wt 215.0 lb

## 2022-03-13 DIAGNOSIS — R918 Other nonspecific abnormal finding of lung field: Secondary | ICD-10-CM
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
## 2022-04-24 ENCOUNTER — Emergency Department (HOSPITAL_COMMUNITY)
Admission: EM | Admit: 2022-04-24 | Discharge: 2022-04-25 | Disposition: A | Discharge status: Home / Self Care | Payer: Medicare Other | Attending: Emergency Medicine | Admitting: Emergency Medicine

## 2022-04-24 ENCOUNTER — Emergency Department (HOSPITAL_COMMUNITY): Payer: Medicare Other

## 2022-04-24 DIAGNOSIS — R630 Anorexia: Secondary | ICD-10-CM | POA: Diagnosis not present

## 2022-04-24 DIAGNOSIS — R079 Chest pain, unspecified: Secondary | ICD-10-CM | POA: Diagnosis present

## 2022-04-24 DIAGNOSIS — M542 Cervicalgia: Secondary | ICD-10-CM | POA: Insufficient documentation

## 2022-04-24 DIAGNOSIS — R0602 Shortness of breath: Secondary | ICD-10-CM | POA: Diagnosis not present

## 2022-04-24 LAB — I-STAT CHEM 8, ED
BUN: 17 mg/dL (ref 8–23)
Calcium, Ion: 1.19 mmol/L (ref 1.15–1.40)
Chloride: 101 mmol/L (ref 98–111)
Creatinine, Ser: 1 mg/dL (ref 0.61–1.24)
Glucose, Bld: 108 mg/dL — ABNORMAL HIGH (ref 70–99)
HCT: 43 % (ref 39.0–52.0)
Hemoglobin: 14.6 g/dL (ref 13.0–17.0)
Potassium: 3.9 mmol/L (ref 3.5–5.1)
Sodium: 138 mmol/L (ref 135–145)
TCO2: 25 mmol/L (ref 22–32)

## 2022-04-24 LAB — COMPREHENSIVE METABOLIC PANEL
ALT: 7 U/L (ref 0–44)
AST: 14 U/L — ABNORMAL LOW (ref 15–41)
Albumin: 3.5 g/dL (ref 3.5–5.0)
Alkaline Phosphatase: 116 U/L (ref 38–126)
Anion gap: 10 (ref 5–15)
BUN: 16 mg/dL (ref 8–23)
CO2: 26 mmol/L (ref 22–32)
Calcium: 9 mg/dL (ref 8.9–10.3)
Chloride: 102 mmol/L (ref 98–111)
Creatinine, Ser: 0.93 mg/dL (ref 0.61–1.24)
GFR, Estimated: >60 mL/min (ref >60)
Glucose, Bld: 110 mg/dL — ABNORMAL HIGH (ref 70–99)
Potassium: 4 mmol/L (ref 3.5–5.1)
Sodium: 138 mmol/L (ref 135–145)
Total Bilirubin: 0.9 mg/dL (ref 0.3–1.2)
Total Protein: 6.6 g/dL (ref 6.5–8.1)

## 2022-04-24 LAB — CBC
HCT: 43.3 % (ref 39.0–52.0)
Hemoglobin: 14.4 g/dL (ref 13.0–17.0)
MCH: 29 pg (ref 26.0–34.0)
MCHC: 33.3 g/dL (ref 30.0–36.0)
MCV: 87.1 fL (ref 80.0–100.0)
Platelets: 260 10*3/uL (ref 150–400)
RBC: 4.97 MIL/uL (ref 4.22–5.81)
RDW: 13.2 % (ref 11.5–15.5)
WBC: 13.8 10*3/uL — ABNORMAL HIGH (ref 4.0–10.5)
nRBC: 0 % (ref 0.0–0.2)

## 2022-04-24 LAB — TROPONIN I (HIGH SENSITIVITY): Troponin I (High Sensitivity): 4 ng/L (ref <18)

## 2022-04-24 LAB — LIPASE, BLOOD: Lipase: 29 U/L (ref 11–51)

## 2022-04-24 MED ORDER — FENTANYL CITRATE PF 50 MCG/ML IJ SOSY
50.0000 ug | PREFILLED_SYRINGE | Freq: Once | INTRAMUSCULAR | Status: AC
  Administered 2022-04-24: 50 ug via INTRAVENOUS
  Filled 2022-04-24: qty 1

## 2022-04-24 MED ORDER — IOHEXOL 350 MG/ML SOLN
100.0000 mL | Freq: Once | INTRAVENOUS | Status: AC | PRN
  Administered 2022-04-24: 100 mL via INTRAVENOUS

## 2022-04-24 NOTE — ED Notes (Signed)
Pt's wife updates with pt's permission

## 2022-04-24 NOTE — ED Triage Notes (Signed)
Pt BIB EMS for left sided chest pain that began as a "crick in his neck " this morning and worked it's way to heavy pressure in the left chest at 2000. Pt radiates to central chest/substernal as well. Pt  given 4 baby aspirin, 1 nitro, '6mg'$  morphine and 400cc NS without relief.   150/98 before nitro 88/56 after nitro RBB

## 2022-04-24 NOTE — ED Provider Notes (Signed)
[facility] EMERGENCY DEPARTMENT Provider Note   CSN: [id number] Arrival date & time: 04/24/22  2132     History  Chief Complaint  Patient presents with   Chest Pain    Left sided    Danny Kerr is a 66 y.o. male.   Chest Pain Associated symptoms: no fever      Pt started having pain initially in his neck around 4pm.  He thought it was a crick in the neck.  Pt started having pain in the chest area around 8 pm.  The pain has been increasing in severity.  He has been feeling diaphoretic and short of breath.  Pt was given asa and NTG without relief.  No hx of HTN or CAD.    No medications taken today.  Home Medications Prior to Admission medications   Medication Sig Start Date End Date Taking? Authorizing Provider  azelastine (ASTELIN) 0.1 % nasal spray Place 2 sprays into both nostrils 2 (two) times daily. Patient not taking: Reported on 03/13/2022 01/08/22   [name], MD  sildenafil (VIAGRA) 100 MG tablet Take 0.5-1 tablets (50-100 mg total) by mouth daily as needed for erectile dysfunction. Patient not taking: Reported on 03/13/2022 12/07/21   [name], MD      Allergies    Patient has no known allergies.    Review of Systems   Review of Systems  Constitutional:  Negative for fever.  Cardiovascular:  Positive for chest pain.    Physical Exam Updated Vital Signs BP 126/73   Pulse 84   Temp 98.7 F (37.1 C) (Oral)   Resp 13   SpO2 94%  Physical Exam Vitals and nursing note reviewed.  Constitutional:      General: He is in acute distress.  HENT:     Head: Normocephalic and atraumatic.     Right Ear: External ear normal.     Left Ear: External ear normal.  Eyes:     General: No scleral icterus.       Right eye: No discharge.        Left eye: No discharge.     Conjunctiva/sclera: Conjunctivae normal.  Neck:     Trachea: No tracheal deviation.  Cardiovascular:     Rate and Rhythm: Normal rate and regular rhythm.   Pulmonary:     Effort: Pulmonary effort is normal. No respiratory distress.     Breath sounds: Normal breath sounds. No stridor. No wheezing or rales.  Abdominal:     General: Bowel sounds are normal. There is no distension.     Palpations: Abdomen is soft.     Tenderness: There is no abdominal tenderness. There is no guarding or rebound.  Musculoskeletal:        General: No tenderness or deformity.     Cervical back: Neck supple.  Skin:    General: Skin is warm and dry.     Findings: No rash.  Neurological:     General: No focal deficit present.     Mental Status: He is alert.     Cranial Nerves: No cranial nerve deficit (no facial droop, extraocular movements intact, no slurred speech).     Sensory: No sensory deficit.     Motor: No abnormal muscle tone or seizure activity.     Coordination: Coordination normal.  Psychiatric:        Mood and Affect: Mood normal.     ED Results / Procedures / Treatments   Labs (  all labs ordered are listed, but only abnormal results are displayed) Labs Reviewed  CBC - Abnormal; Notable for the following components:      Result Value   WBC 13.8 (*)    All other components within normal limits  COMPREHENSIVE METABOLIC PANEL - Abnormal; Notable for the following components:   Glucose, Bld 110 (*)    AST 14 (*)    All other components within normal limits  I-STAT CHEM 8, ED - Abnormal; Notable for the following components:   Glucose, Bld 108 (*)    All other components within normal limits  LIPASE, BLOOD  TROPONIN I (HIGH SENSITIVITY)  TROPONIN I (HIGH SENSITIVITY)    EKG EKG Interpretation  Date/Time:  Tuesday April 24 2022 21:58:47 EDT Ventricular Rate:  84 PR Interval:  202 QRS Duration: 156 QT Interval:  392 QTC Calculation: 464 R Axis:   -34 Text Interpretation: Sinus rhythm Right bundle branch block No significant change since last tracing Confirmed by Dorie Rank [phone]) on 04/24/2022 10:13:20 PM  Radiology CT Angio  Chest/Abd/Pel for Dissection W and/or Wo Contrast  Result Date: 04/24/2022 CLINICAL DATA:  Left-sided chest pain. EXAM: CT ANGIOGRAPHY CHEST, ABDOMEN AND PELVIS TECHNIQUE: Non-contrast CT of the chest was initially obtained. Multidetector CT imaging through the chest, abdomen and pelvis was performed using the standard protocol during bolus administration of intravenous contrast. Multiplanar reconstructed images and MIPs were obtained and reviewed to evaluate the vascular anatomy. RADIATION DOSE REDUCTION: This exam was performed according to the departmental dose-optimization program which includes automated exposure control, adjustment of the mA and/or kV according to patient size and/or use of iterative reconstruction technique. CONTRAST:  131m OMNIPAQUE IOHEXOL 350 MG/ML SOLN COMPARISON:  Mar 07, 2022 FINDINGS: CTA CHEST FINDINGS Cardiovascular: The thoracic aorta is normal in appearance, without evidence of aortic aneurysm or dissection. Satisfactory opacification of the pulmonary arteries to the segmental level. No evidence of pulmonary embolism. Normal heart size with mild coronary artery calcification. No pericardial effusion. Mediastinum/Nodes: No enlarged mediastinal, hilar, or axillary lymph nodes. Thyroid gland, trachea, and esophagus demonstrate no significant findings. Lungs/Pleura: Mild atelectasis is seen within the posterior aspects of the bilateral lower lobes. Stable 4 mm and 5 mm noncalcified anterior right middle lobe lung nodules are seen. A stable loculated right-sided pleural effusion is seen. No pneumothorax is identified. Musculoskeletal: Multilevel degenerative changes seen throughout the thoracic spine. Review of the MIP images confirms the above findings. CTA ABDOMEN AND PELVIS FINDINGS VASCULAR Aorta: Normal caliber aorta without aneurysm, dissection, vasculitis or significant stenosis. Celiac: Patent without evidence of aneurysm, dissection, vasculitis or significant stenosis. SMA:  Patent without evidence of aneurysm, dissection, vasculitis or significant stenosis. Renals: Both renal arteries are patent without evidence of aneurysm, dissection, vasculitis, fibromuscular dysplasia or significant stenosis. IMA: Patent without evidence of aneurysm, dissection, vasculitis or significant stenosis. Inflow: Patent without evidence of aneurysm, dissection, vasculitis or significant stenosis. Veins: No obvious venous abnormality within the limitations of this arterial phase study. Review of the MIP images confirms the above findings. NON-VASCULAR Hepatobiliary: There is diffuse fatty infiltration of the liver parenchyma. No focal liver abnormality is seen. No gallstones, gallbladder wall thickening, or biliary dilatation. Pancreas: The pancreatic parenchyma is unremarkable. There is no evidence of pancreatic ductal dilatation. A trace amount of inflammatory fat stranding is seen in between the pancreatic head and inferior vena cava. Spleen: Normal in size without focal abnormality. Adrenals/Urinary Tract: Adrenal glands are unremarkable. Kidneys are normal, without renal calculi, focal lesion, or hydronephrosis. Bladder  is unremarkable. Stomach/Bowel: Stomach is within normal limits. Appendix appears normal. No evidence of bowel wall thickening, distention, or inflammatory changes. Noninflamed diverticula are seen throughout the large bowel. Lymphatic: No abnormal abdominal or pelvic lymph nodes are identified. Reproductive: Prostate is unremarkable. Other: No abdominal wall hernia or abnormality. No abdominopelvic ascites. Musculoskeletal: A metallic density intramedullary rod and proximal fixation screws are seen within the left femur. Multilevel degenerative changes seen throughout the lumbar spine. Review of the MIP images confirms the above findings. IMPRESSION: 1. No evidence of pulmonary embolism. 2. Stable loculated right-sided pleural effusion. 3. Stable 4 mm and 5 mm noncalcified anterior  right middle lobe lung nodules. 4. Hepatic steatosis. 5. Colonic diverticulosis. 6. No evidence of aneurysmal dilatation, dissection, vasculitis or significant stenosis within the aorta or arterial structures of the chest, abdomen and pelvis. Electronically Signed   By: Virgina Norfolk M.D.   On: 04/24/2022 23:31   DG Chest Portable 1 View  Result Date: 04/24/2022 CLINICAL DATA:  Chest pain for several hours EXAM: PORTABLE CHEST 1 VIEW COMPARISON:  11/25/2020 FINDINGS: Cardiac shadow is stable. The lungs are well aerated bilaterally. Old rib fractures are again noted. Mild bibasilar atelectatic changes are seen. IMPRESSION: Mild bibasilar atelectasis. Electronically Signed   By: Inez Catalina M.D.   On: 04/24/2022 22:25    Procedures Procedures    Medications Ordered in ED Medications  fentaNYL (SUBLIMAZE) injection 50 mcg (50 mcg Intravenous Given 04/24/22 2227)  iohexol (OMNIPAQUE) 350 MG/ML injection 100 mL (100 mLs Intravenous Contrast Given 04/24/22 2313)    ED Course/ Medical Decision Making/ A&P           HEART Score: 3                Medical Decision Making Amount and/or Complexity of Data Reviewed Labs: ordered. Radiology: ordered.  Risk Prescription drug management.   Patient presented to ED for evaluation of chest pain.  Concerned about the possibility of acute coronary syndrome, pancreatitis, biliary colic.  Aortic dissection.  No acute findings noted on EKG.  Low risk heart score.  Initial troponin is normal.  CT angiogram was performed and fortunately no acute abnormalities noted.  Patient has remained pain-free at this time.  Delta troponin is pending  Care turned over to Dr Christy Gentles at shift change.        Final Clinical Impression(s) / ED Diagnoses Final diagnoses:  Chest pain, unspecified type    Rx / DC Orders ED Discharge Orders     None         Dorie Rank, MD 04/25/22 0005

## 2022-04-25 DIAGNOSIS — R079 Chest pain, unspecified: Secondary | ICD-10-CM | POA: Diagnosis not present

## 2022-04-25 LAB — TROPONIN I (HIGH SENSITIVITY): Troponin I (High Sensitivity): 5 ng/L (ref <18)

## 2022-04-25 NOTE — Discharge Instructions (Signed)

## 2022-04-25 NOTE — ED Provider Notes (Signed)
I assumed care in signout to follow-up on repeat troponin.  Troponins were unremarkable.  I have reviewed all the results, and reviewed current EKG, EMS EKG and previous EKGs.  No STEMI is noted. Patient reports he has mild pain but is overall feeling improved.  He is in no acute distress.  CT chest was negative for PE/dissection Patient given the option of admission versus discharge.  He prefers to go home but will place cardiology referral.  Patient reports he no longer smokes, no longer drinks alcohol and is very active.  He works out on a regular basis without any limitations We discussed strict ER return precautions   Ripley Fraise, MD 04/25/22 0136
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
## 2022-05-04 ENCOUNTER — Ambulatory Visit (INDEPENDENT_AMBULATORY_CARE_PROVIDER_SITE_OTHER): Payer: Medicare Other | Admitting: Cardiovascular Disease

## 2022-05-04 ENCOUNTER — Encounter: Payer: Self-pay | Admitting: Cardiovascular Disease

## 2022-05-04 VITALS — BP 120/80 | HR 69 | Ht 75.0 in | Wt 220.0 lb

## 2022-05-04 DIAGNOSIS — R072 Precordial pain: Secondary | ICD-10-CM

## 2022-05-04 DIAGNOSIS — I4819 Other persistent atrial fibrillation: Secondary | ICD-10-CM

## 2022-05-04 NOTE — Patient Instructions (Signed)
Medication Instructions:  °The current medical regimen is effective;  continue present plan and medications. ° °*If you need a refill on your cardiac medications before your next appointment, please call your pharmacy* ° ° °Follow-Up: °At [facility], you and your health needs are our priority.  As part of our continuing mission to provide you with exceptional heart care, we have created designated Provider Care Teams.  These Care Teams include your primary Cardiologist (physician) and Advanced Practice Providers (APPs -  Physician Assistants and Nurse Practitioners) who all work together to provide you with the care you need, when you need it. ° °We recommend signing up for the patient portal called "MyChart".  Sign up information is provided on this After Visit Summary.  MyChart is used to connect with patients for Virtual Visits (Telemedicine).  Patients are able to view lab/test results, encounter notes, upcoming appointments, etc.  Non-urgent messages can be sent to your provider as well.   °To learn more about what you can do with MyChart, go to [email or web address].   ° °Your next appointment:   °12 month(s) ° °The format for your next appointment:   °In Person ° °Provider:   °[place] T O'Neal, MD   ° ° ° °

## 2022-08-08 ENCOUNTER — Ambulatory Visit (INDEPENDENT_AMBULATORY_CARE_PROVIDER_SITE_OTHER): Payer: Medicare Other

## 2022-08-08 DIAGNOSIS — Z23 Encounter for immunization: Secondary | ICD-10-CM
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
## 2022-12-11 ENCOUNTER — Encounter: Payer: Self-pay | Admitting: Family Medicine

## 2022-12-11 ENCOUNTER — Ambulatory Visit (INDEPENDENT_AMBULATORY_CARE_PROVIDER_SITE_OTHER): Payer: Medicare Other | Admitting: Family Medicine

## 2022-12-11 VITALS — BP 127/79 | HR 69 | Temp 98.4°F | Ht 75.0 in | Wt 223.0 lb

## 2022-12-11 DIAGNOSIS — R739 Hyperglycemia, unspecified: Secondary | ICD-10-CM

## 2022-12-11 DIAGNOSIS — R351 Nocturia: Secondary | ICD-10-CM

## 2022-12-11 DIAGNOSIS — D696 Thrombocytopenia, unspecified: Secondary | ICD-10-CM

## 2022-12-11 DIAGNOSIS — E785 Hyperlipidemia, unspecified: Secondary | ICD-10-CM

## 2022-12-11 DIAGNOSIS — N529 Male erectile dysfunction, unspecified: Secondary | ICD-10-CM

## 2022-12-11 DIAGNOSIS — J90 Pleural effusion, not elsewhere classified: Secondary | ICD-10-CM

## 2022-12-11 NOTE — Patient Instructions (Signed)
It was very nice to see you today!  We will check blood work.  Please keep up the great work your diet and exercise.  We will see you back in a year for your next annual checkup.  Come back sooner if needed.  Take care, Dr Jerline Pain  PLEASE NOTE:  If you had any lab tests, please let us know if you have not heard back within a few days. You may see your results on mychart before we have a chance to review them but we will give you a call once they are reviewed by [place].   If we ordered any referrals today, please let us know if you have not heard from their office within the next week.   If you had any urgent prescriptions sent in today, please check with the pharmacy within an hour of our visit to make sure the prescription was transmitted appropriately.   Please try these tips to maintain a healthy lifestyle:  Eat at least 3 REAL meals and 1-2 snacks per day.  Aim for no more than 5 hours between eating.  If you eat breakfast, please do so within one hour of getting up.   Each meal should contain half fruits/vegetables, one quarter protein, and one quarter carbs (no bigger than a computer mouse)  Cut down on sweet beverages. This includes juice, soda, and sweet tea.   Drink at least 1 glass of water with each meal and aim for at least 8 glasses per day  Exercise at least 150 minutes every week.    Preventive Care 32 Years and Older, Male Preventive care refers to lifestyle choices and visits with your health care provider that can promote health and wellness. Preventive care visits are also called wellness exams. What can I expect for my preventive care visit? Counseling During your preventive care visit, your health care provider may ask about your: Medical history, including: Past medical problems. Family medical history. History of falls. Current health, including: Emotional well-being. Home life and relationship well-being. Sexual activity. Memory and ability to understand  (cognition). Lifestyle, including: Alcohol, nicotine or tobacco, and drug use. Access to firearms. Diet, exercise, and sleep habits. Work and work Statistician. Sunscreen use. Safety issues such as seatbelt and bike helmet use. Physical exam Your health care provider will check your: Height and weight. These may be used to calculate your BMI (body mass index). BMI is a measurement that tells if you are at a healthy weight. Waist circumference. This measures the distance around your waistline. This measurement also tells if you are at a healthy weight and may help predict your risk of certain diseases, such as type 2 diabetes and high blood pressure. Heart rate and blood pressure. Body temperature. Skin for abnormal spots. What immunizations do I need?  Vaccines are usually given at various ages, according to a schedule. Your health care provider will recommend vaccines for you based on your age, medical history, and lifestyle or other factors, such as travel or where you work. What tests do I need? Screening Your health care provider may recommend screening tests for certain conditions. This may include: Lipid and cholesterol levels. Diabetes screening. This is done by checking your blood sugar (glucose) after you have not eaten for a while (fasting). Hepatitis C test. Hepatitis B test. HIV (human immunodeficiency virus) test. STI (sexually transmitted infection) testing, if you are at risk. Lung cancer screening. Colorectal cancer screening. Prostate cancer screening. Abdominal aortic aneurysm (AAA) screening. You may need  this if you are a current or former smoker. Talk with your health care provider about your test results, treatment options, and if necessary, the need for more tests. Follow these instructions at home: Eating and drinking  Eat a diet that includes fresh fruits and vegetables, whole grains, lean protein, and low-fat dairy products. Limit your intake of foods with  high amounts of sugar, saturated fats, and salt. Take vitamin and mineral supplements as recommended by your health care provider. Do not drink alcohol if your health care provider tells you not to drink. If you drink alcohol: Limit how much you have to 0-2 drinks a day. Know how much alcohol is in your drink. In the [place] one drink equals one 12 oz bottle of beer (355 mL), one 5 oz glass of wine (148 mL), or one 1 oz glass of hard liquor (44 mL). Lifestyle Brush your teeth every morning and night with fluoride toothpaste. Floss one time each day. Exercise for at least 30 minutes 5 or more days each week. Do not use any products that contain nicotine or tobacco. These products include cigarettes, chewing tobacco, and vaping devices, such as e-cigarettes. If you need help quitting, ask your health care provider. Do not use drugs. If you are sexually active, practice safe sex. Use a condom or other form of protection to prevent STIs. Take aspirin only as told by your health care provider. Make sure that you understand how much to take and what form to take. Work with your health care provider to find out whether it is safe and beneficial for you to take aspirin daily. Ask your health care provider if you need to take a cholesterol-lowering medicine (statin). Find healthy ways to manage stress, such as: Meditation, yoga, or listening to music. Journaling. Talking to a trusted person. Spending time with friends and family. Safety Always wear your seat belt while driving or riding in a vehicle. Do not drive: If you have been drinking alcohol. Do not ride with someone who has been drinking. When you are tired or distracted. While texting. If you have been using any mind-altering substances or drugs. Wear a helmet and other protective equipment during sports activities. If you have firearms in your house, make sure you follow all gun safety procedures. Minimize exposure to UV radiation to  reduce your risk of skin cancer. What's next? Visit your health care provider once a year for an annual wellness visit. Ask your health care provider how often you should have your eyes and teeth checked. Stay up to date on all vaccines. This information is not intended to replace advice given to you by your health care provider. Make sure you discuss any questions you have with your health care provider. Document Revised: 04/05/2021 Document Reviewed: 04/05/2021 Elsevier Patient Education  [place].

## 2022-12-11 NOTE — Assessment & Plan Note (Signed)
Check A1c. 
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
## 2022-12-11 NOTE — Assessment & Plan Note (Signed)
Check lipids. Discussed lifestyle modifications.  

## 2022-12-11 NOTE — Assessment & Plan Note (Signed)
Check CBC 

## 2022-12-11 NOTE — Assessment & Plan Note (Signed)
Follows with cardio thoracic surgery.

## 2022-12-13 ENCOUNTER — Other Ambulatory Visit (INDEPENDENT_AMBULATORY_CARE_PROVIDER_SITE_OTHER): Payer: Medicare Other

## 2022-12-13 DIAGNOSIS — R739 Hyperglycemia, unspecified: Secondary | ICD-10-CM | POA: Diagnosis not present

## 2022-12-13 DIAGNOSIS — N529 Male erectile dysfunction, unspecified: Secondary | ICD-10-CM | POA: Diagnosis not present

## 2022-12-13 DIAGNOSIS — R351 Nocturia: Secondary | ICD-10-CM

## 2022-12-13 DIAGNOSIS — E785 Hyperlipidemia, unspecified: Secondary | ICD-10-CM

## 2022-12-13 LAB — CBC
HCT: 44.9 % (ref 39.0–52.0)
Hemoglobin: 15.3 g/dL (ref 13.0–17.0)
MCHC: 34.1 g/dL (ref 30.0–36.0)
MCV: 86.6 fl (ref 78.0–100.0)
Platelets: 286 10*3/uL (ref 150.0–400.0)
RBC: 5.18 Mil/uL (ref 4.22–5.81)
RDW: 14.1 % (ref 11.5–15.5)
WBC: 7.1 10*3/uL (ref 4.0–10.5)

## 2022-12-13 LAB — HEMOGLOBIN A1C: Hgb A1c MFr Bld: 5.7 % (ref 4.6–6.5)

## 2022-12-13 LAB — COMPREHENSIVE METABOLIC PANEL
ALT: 6 U/L (ref 0–53)
AST: 20 U/L (ref 0–37)
Albumin: 4.4 g/dL (ref 3.5–5.2)
Alkaline Phosphatase: 112 U/L (ref 39–117)
BUN: 11 mg/dL (ref 6–23)
CO2: 27 mEq/L (ref 19–32)
Calcium: 9.6 mg/dL (ref 8.4–10.5)
Chloride: 102 mEq/L (ref 96–112)
Creatinine, Ser: 0.77 mg/dL (ref 0.40–1.50)
GFR: 92.67 mL/min (ref >60.00)
Glucose, Bld: 90 mg/dL (ref 70–99)
Potassium: 4.2 mEq/L (ref 3.5–5.1)
Sodium: 138 mEq/L (ref 135–145)
Total Bilirubin: 0.8 mg/dL (ref 0.2–1.2)
Total Protein: 7.1 g/dL (ref 6.0–8.3)

## 2022-12-13 LAB — LIPID PANEL
Cholesterol: 153 mg/dL (ref 0–200)
HDL: 43.4 mg/dL (ref >39.00)
LDL Cholesterol: 97 mg/dL (ref 0–99)
NonHDL: 110.03
Total CHOL/HDL Ratio: 4
Triglycerides: 65 mg/dL (ref 0.0–149.0)
VLDL: 13 mg/dL (ref 0.0–40.0)

## 2022-12-13 LAB — PSA: PSA: 1.04 ng/mL (ref 0.10–4.00)

## 2022-12-13 LAB — TSH: TSH: 1.37 u[IU]/mL (ref 0.35–5.50)

## 2022-12-17 ENCOUNTER — Telehealth: Payer: Self-pay | Admitting: Family Medicine

## 2022-12-17 NOTE — Progress Notes (Signed)
Please inform patient of the following:  Great news! Labs are all stable.  Do not need to make any changes to his treatment plan at this time.  He should continue to work on diet and exercise and we can recheck everything in a year.

## 2022-12-17 NOTE — Telephone Encounter (Signed)
Patient states he is returning a call re: Labs. Requests to be called.

## 2022-12-18 NOTE — Telephone Encounter (Signed)
See results note. 

## 2022-12-19 ENCOUNTER — Telehealth: Payer: Self-pay | Admitting: Family Medicine

## 2022-12-19 NOTE — Telephone Encounter (Signed)
Danny Kerr to schedule their annual wellness visit. Appointment made for 12/25/2022.  [place] Direct Dial [phone]

## 2022-12-19 NOTE — Telephone Encounter (Signed)
Copied from [place] [id number]. Topic: Medicare AWV >> Dec 19, 2022 11:04 AM Gillis Santa wrote: Reason for CRM: LVM FOR PATIENT TO CALL KAREN [phone] R/S AWVS WITH HEALTH COACH TINA.

## 2022-12-25 ENCOUNTER — Ambulatory Visit (INDEPENDENT_AMBULATORY_CARE_PROVIDER_SITE_OTHER): Payer: Medicare Other

## 2022-12-25 VITALS — Wt 223.0 lb

## 2022-12-25 DIAGNOSIS — Z0000 Encounter for general adult medical examination without abnormal findings: Secondary | ICD-10-CM | POA: Diagnosis not present

## 2022-12-25 NOTE — Progress Notes (Signed)
I connected with  Danny Kerr on 12/25/22 by a audio enabled telemedicine application and verified that I am speaking with the correct person using two identifiers.  Patient Location: Home  Provider Location: Office/Clinic  I discussed the limitations of evaluation and management by telemedicine. The patient expressed understanding and agreed to proceed.   Subjective:   Danny Kerr is a 67 y.o. male who presents for Medicare Annual/Subsequent preventive examination.  Review of Systems     Cardiac Risk Factors include: advanced age (>84mn, >>40women);male gender;dyslipidemia     Objective:    Today's Vitals   12/25/22 0826  Weight: 223 lb (101.2 kg)   Body mass index is 27.87 kg/m.     12/25/2022    8:29 AM 12/22/2021    2:32 PM 10/26/2020    3:04 PM 06/27/2020    9:43 PM 06/27/2020    6:58 AM 02/03/2020   11:46 AM 12/12/2019   10:00 PM  Advanced Directives  Does Patient Have a Medical Advance Directive? Yes Yes Yes No No No No  Type of AParamed[place]of [place]et [place] will Healthcare Power of [place]      Copy of [place] Chart? No - copy requested No - copy requested Yes - validated most recent copy scanned in chart (See row information)      Would patient like information on creating a medical advance directive?    No - Patient declined No - Patient declined  No - Patient declined    Current Medications (verified) Outpatient Encounter Medications as of 12/25/2022  Medication Sig   sildenafil (VIAGRA) 100 MG tablet Take 0.5-1 tablets (50-100 mg total) by mouth daily as needed for erectile dysfunction. (Patient not taking: Reported on 12/11/2022)   No facility-administered encounter medications on file as of 12/25/2022.    Allergies (verified) Patient has no known allergies.   History: Past Medical History:  Diagnosis Date   Arrhythmia    Arthritis    Displaced segmental  fracture of shaft of left femur[place]   ETOH abuse    Pneumonia    Rosacea    Seasonal allergies    Past Surgical History:  Procedure Laterality Date   COLONOSCOPY     FEMUR IM NAIL Left 07/16/2020   Procedure: INTRAMEDULLARY (IM) NAIL FEMORAL;  Surgeon: HAltamese Prosperity MD;  [place] [facility]  Service: Orthopedics;  Laterality: Left;   IR THORACENTESIS ASP PLEURAL SPACE W/IMG GUIDE  11/25/2020   KNEE ARTHROTOMY  1977   rt knee   RADIOLOGY WITH ANESTHESIA N/A 09/03/2017   Procedure: [place]ERVICAL [place];  Surgeon: Radiologist, Medication, MD;  [place] [facility]  Service: Radiology;  Laterality: N/A;   TOTAL KNEE ARTHROPLASTY Right 09/06/2014   Procedure: RIGHT TOTAL KNEE ARTHROPLASTY;  Surgeon: FGearlean Alf MD;  [facility];  Service: Orthopedics;  Laterality: Right;   Family History  Problem Relation Age of Onset   Lung cancer Mother    Cancer Mother    Parkinson's disease Sister    Heart disease Sister    Social History   Socioeconomic History   Marital status: Legally Separated    Spouse name: Not on file   Number of children: Not on file   Years of education: Not on file   Highest education level: Not on file  Occupational History   Occupation: retired  Tobacco Use   Smoking status:  Former    Years: 5.00    Types: Cigarettes    Quit date: 08/26/1989    Years since quitting: 33.3   Smokeless tobacco: Never  Vaping Use   Vaping Use: Never used  Substance and Sexual Activity   Alcohol use: Not Currently    Comment: heavy daily alcohol use   Drug use: No   Sexual activity: Not on file  Other Topics Concern   Not on file  Social History Narrative   ** Merged History Encounter **       Social Determinants of Health   Financial Resource Strain: Low Risk  (12/25/2022)   Overall Financial Resource Strain (CARDIA)    Difficulty of Paying Living Expenses: Not hard at all  Food Insecurity: No Food  Insecurity (12/25/2022)   Hunger Vital Sign    Worried About Running Out of Food in the Last Year: Never true    Ran Out of Food in the Last Year: Never true  Transportation Needs: No Transportation Needs (12/25/2022)   PRAPARE - Hydrologist (Medical): No    Lack of Transportation (Non-Medical): No  Physical Activity: Sufficiently Active (12/25/2022)   Exercise Vital Sign    Days of Exercise per Week: 6 days    Minutes of Exercise per Session: 60 min  Stress: No Stress Concern Present (12/25/2022)   [place]    Feeling of Stress : Not at all  Social Connections: Moderately Isolated (12/25/2022)   Social Connection and Isolation Panel [NHANES]    Frequency of Communication with Friends and Family: More than three times a week    Frequency of Social Gatherings with Friends and Family: More than three times a week    Attends Religious Services: More than 4 times per year    Active Member of [employer] or Organizations: No    Attends Archivist Meetings: Never    Marital Status: Separated    Tobacco Counseling Counseling given: Not Answered   Clinical Intake:  Pre-visit preparation completed: Yes  Pain : No/denies pain     BMI - recorded: 27.87 Nutritional Status: BMI 25 -29 Overweight Nutritional Risks: None Diabetes: No  How often do you need to have someone help you when you read instructions, pamphlets, or other written materials from your doctor or pharmacy?: 1 - Never  Diabetic?no  Interpreter Needed?: No  Information entered by :: Harrie Jeans, LPN   Activities of Daily Living    12/25/2022    8:29 AM  In your present state of health, do you have any difficulty performing the following activities:  Hearing? 0  Vision? 0  Difficulty concentrating or making decisions? 0  Walking or climbing stairs? 0  Dressing or bathing? 0  Doing errands, shopping? 0  Preparing  Food and eating ? N  Using the Toilet? N  In the past six months, have you accidently leaked urine? N  Do you have problems with loss of bowel control? N  Managing your Medications? N  Managing your Finances? N  Housekeeping or managing your Housekeeping? N    Patient Care Team: [name], MD as PCP - General (Family Medicine) O'Neal, Cassie Freer, MD as PCP - Cardiology (Internal Medicine) [name], MD (Family Medicine)  Indicate any recent Medical Services you may have received from other than [facility] providers in the past year (date may be approximate).     Assessment:   This is  a routine wellness examination for [place].  Hearing/Vision screen Hearing Screening - Comments:: Pt denies any hearing issues  Vision Screening - Comments:: Pt follows up with Dr Gershon Crane for annual eye exams   Dietary issues and exercise activities discussed: Current Exercise Habits: Home exercise routine, Type of exercise: Other - see comments, Time (Minutes): > 60, Frequency (Times/Week): 6, Weekly Exercise (Minutes/Week): 0   Goals Addressed             This Visit's Progress    Patient Stated       Stay healthy and active        Depression Screen    12/25/2022    8:28 AM 12/11/2022    9:09 AM 12/22/2021    2:31 PM 12/07/2021   10:50 AM 07/28/2021   11:55 AM 10/11/2020   10:31 AM 10/03/2019    3:35 PM  PHQ 2/9 Scores  PHQ - 2 Score 0 0 0 0 0 0   PHQ- 9 Score            Information is confidential and restricted. Go to Review Flowsheets to unlock data.    Fall Risk    12/25/2022    8:29 AM 12/11/2022    9:10 AM 12/22/2021    2:33 PM 12/07/2021   10:51 AM  Fall Risk   Falls in the past year? 0 0 0 0  Number falls in past yr: 0 0 0 0  Injury with Fall? 0 0 0 0  Risk for fall due to : Impaired vision No Fall Risks Impaired vision No Fall Risks  Follow up Falls prevention discussed  Falls prevention discussed     FALL RISK PREVENTION PERTAINING TO THE HOME:  Any stairs  in or around the home? No  If so, are there any without handrails? No  Home free of loose throw rugs in walkways, pet beds, electrical cords, etc? Yes  Adequate lighting in your home to reduce risk of falls? Yes   ASSISTIVE DEVICES UTILIZED TO PREVENT FALLS:  Life alert? No  Use of a cane, walker or w/c? No  Grab bars in the bathroom? Yes  Shower chair or bench in shower? No  Elevated toilet seat or a handicapped toilet? No   TIMED UP AND GO:  Was the test performed? No .   Cognitive Function:        12/25/2022    8:30 AM 12/22/2021    2:35 PM  6CIT Screen  What Year? 0 points 0 points  What month? 0 points 0 points  What time? 0 points 0 points  Count back from 20 0 points 0 points  Months in reverse 0 points 0 points  Repeat phrase 0 points 0 points  Total Score 0 points 0 points    Immunizations Immunization History  Administered Date(s) Administered   Fluad Quad(high Dose 65+) 07/28/2021, 08/08/2022   Influenza,inj,Quad PF,6+ Mos 08/05/2019   Influenza,inj,quad, With Preservative 07/22/2017   PFIZER(Purple Top)SARS-COV-2 Vaccination 01/18/2020, 02/23/2020, 10/03/2020   PNEUMOCOCCAL CONJUGATE-20 12/07/2021   Brent Covid Bivalent Pediatric Vaccine(35mo to <528yr 11/02/2021   Td 12/06/2015    TDAP status: Up to date  Flu Vaccine status: Up to date  Pneumococcal vaccine status: Up to date  Covid-19 vaccine status: Completed vaccines  Qualifies for Shingles Vaccine? Yes   Zostavax completed No   Shingrix Completed?: No.    Education has been provided regarding the importance of this vaccine. Patient has been advised to call insurance company to determine  out of pocket expense if they have not yet received this vaccine. Advised may also receive vaccine at local pharmacy or Health Dept. Verbalized acceptance and understanding.  Screening Tests Health Maintenance  Topic Date Due   Zoster Vaccines- Shingrix (1 of 2) Never done   COVID-19 Vaccine (5 - 2023-24  season) 12/27/2022 (Originally 06/22/2022)   Hepatitis C Screening  12/12/2023 (Originally 06/26/1974)   Medicare Annual Wellness (AWV)  12/25/2023   COLONOSCOPY (Pts 45-6yr Insurance coverage will need to be confirmed)  11/22/2024   DTaP/Tdap/Td (2 - Tdap) 12/05/2025   Pneumonia Vaccine 67 Years old  Completed   INFLUENZA VACCINE  Completed   HPV VACCINES  Aged Out    Health Maintenance  Health Maintenance Due  Topic Date Due   Zoster Vaccines- Shingrix (1 of 2) Never done    Colorectal cancer screening: Type of screening: Colonoscopy. Completed 11/22/14. Repeat every 10 years  Additional Screening:  Hepatitis C Screening: does qualify;  Vision Screening: Recommended annual ophthalmology exams for early detection of glaucoma and other disorders of the eye. Is the patient up to date with their annual eye exam?  Yes  Who is the provider or what is the name of the office in which the patient attends annual eye exams? Dr SGershon Crane If pt is not established with a provider, would they like to be referred to a provider to establish care? No .   Dental Screening: Recommended annual dental exams for proper oral hygiene  Community Resource Referral / Chronic Care Management: CRR required this visit?  No   CCM required this visit?  No      Plan:     I have personally reviewed and noted the following in the patient's chart:   Medical and social history Use of alcohol, tobacco or illicit drugs  Current medications and supplements including opioid prescriptions. Patient is not currently taking opioid prescriptions. Functional ability and status Nutritional status Physical activity Advanced directives List of other physicians Hospitalizations, surgeries, and ER visits in previous 12 months Vitals Screenings to include cognitive, depression, and falls Referrals and appointments  In addition, I have reviewed and discussed with patient certain preventive protocols, quality metrics,  and best practice recommendations. A written personalized care plan for preventive services as well as general preventive health recommendations were provided to patient.     TWillette Brace LPN   [id number]  Nurse Notes: none

## 2022-12-25 NOTE — Patient Instructions (Signed)
[name] , Thank you for taking time to come for your Medicare Wellness Visit. I appreciate your ongoing commitment to your health goals. Please review the following plan we discussed and let me know if I can assist you in the future.   These are the goals we discussed:  Goals      Patient Stated     None at this time      Patient Stated     Stay healthy and active         This is a list of the screening recommended for you and due dates:  Health Maintenance  Topic Date Due   Zoster (Shingles) Vaccine (1 of 2) Never done   COVID-19 Vaccine (5 - 2023-24 season) 12/27/2022*   Hepatitis C Screening: USPSTF Recommendation to screen - Ages 18-79 yo.  12/12/2023*   Medicare Annual Wellness Visit  12/25/2023   Colon Cancer Screening  11/22/2024   DTaP/Tdap/Td vaccine (2 - Tdap) 12/05/2025   Pneumonia Vaccine  Completed   Flu Shot  Completed   HPV Vaccine  Aged Out  *Topic was postponed. The date shown is not the original due date.    Advanced directives: Please bring a copy of your health care power of attorney and living will to the office at your convenience.  Conditions/risks identified: stay healthy and active   Next appointment: Follow up in one year for your annual wellness visit.   Preventive Care 43 Years and Older, Male  Preventive care refers to lifestyle choices and visits with your health care provider that can promote health and wellness. What does preventive care include? A yearly physical exam. This is also called an annual well check. Dental exams once or twice a year. Routine eye exams. Ask your health care provider how often you should have your eyes checked. Personal lifestyle choices, including: Daily care of your teeth and gums. Regular physical activity. Eating a healthy diet. Avoiding tobacco and drug use. Limiting alcohol use. Practicing safe sex. Taking low doses of aspirin every day. Taking vitamin and mineral supplements as recommended by your  health care provider. What happens during an annual well check? The services and screenings done by your health care provider during your annual well check will depend on your age, overall health, lifestyle risk factors, and family history of disease. Counseling  Your health care provider may ask you questions about your: Alcohol use. Tobacco use. Drug use. Emotional well-being. Home and relationship well-being. Sexual activity. Eating habits. History of falls. Memory and ability to understand (cognition). Work and work Statistician. Screening  You may have the following tests or measurements: Height, weight, and BMI. Blood pressure. Lipid and cholesterol levels. These may be checked every 5 years, or more frequently if you are over 71 years old. Skin check. Lung cancer screening. You may have this screening every year starting at age 68 if you have a 30-pack-year history of smoking and currently smoke or have quit within the past 15 years. Fecal occult blood test (FOBT) of the stool. You may have this test every year starting at age 68. Flexible sigmoidoscopy or colonoscopy. You may have a sigmoidoscopy every 5 years or a colonoscopy every 10 years starting at age 68. Prostate cancer screening. Recommendations will vary depending on your family history and other risks. Hepatitis C blood test. Hepatitis B blood test. Sexually transmitted disease (STD) testing. Diabetes screening. This is done by checking your blood sugar (glucose) after you have not eaten for a  while (fasting). You may have this done every 1-3 years. Abdominal aortic aneurysm (AAA) screening. You may need this if you are a current or former smoker. Osteoporosis. You may be screened starting at age 68 if you are at high risk. Talk with your health care provider about your test results, treatment options, and if necessary, the need for more tests. Vaccines  Your health care provider may recommend certain vaccines, such  as: Influenza vaccine. This is recommended every year. Tetanus, diphtheria, and acellular pertussis (Tdap, Td) vaccine. You may need a Td booster every 10 years. Zoster vaccine. You may need this after age 68. Pneumococcal 13-valent conjugate (PCV13) vaccine. One dose is recommended after age 68. Pneumococcal polysaccharide (PPSV23) vaccine. One dose is recommended after age 68. Talk to your health care provider about which screenings and vaccines you need and how often you need them. This information is not intended to replace advice given to you by your health care provider. Make sure you discuss any questions you have with your health care provider. Document Released: 11/04/2015 Document Revised: 06/27/2016 Document Reviewed: 08/09/2015 Elsevier Interactive Patient Education  2017 [place] Prevention in the Home Falls can cause injuries. They can happen to people of all ages. There are many things you can do to make your home safe and to help prevent falls. What can I do on the outside of my home? Regularly fix the edges of walkways and driveways and fix any cracks. Remove anything that might make you trip as you walk through a door, such as a raised step or threshold. Trim any bushes or trees on the path to your home. Use bright outdoor lighting. Clear any walking paths of anything that might make someone trip, such as rocks or tools. Regularly check to see if handrails are loose or broken. Make sure that both sides of any steps have handrails. Any raised decks and porches should have guardrails on the edges. Have any leaves, snow, or ice cleared regularly. Use sand or salt on walking paths during winter. Clean up any spills in your garage right away. This includes oil or grease spills. What can I do in the bathroom? Use night lights. Install grab bars by the toilet and in the tub and shower. Do not use towel bars as grab bars. Use non-skid mats or decals in the tub or  shower. If you need to sit down in the shower, use a plastic, non-slip stool. Keep the floor dry. Clean up any water that spills on the floor as soon as it happens. Remove soap buildup in the tub or shower regularly. Attach bath mats securely with double-sided non-slip rug tape. Do not have throw rugs and other things on the floor that can make you trip. What can I do in the bedroom? Use night lights. Make sure that you have a light by your bed that is easy to reach. Do not use any sheets or blankets that are too big for your bed. They should not hang down onto the floor. Have a firm chair that has side arms. You can use this for support while you get dressed. Do not have throw rugs and other things on the floor that can make you trip. What can I do in the kitchen? Clean up any spills right away. Avoid walking on wet floors. Keep items that you use a lot in easy-to-reach places. If you need to reach something above you, use a strong step stool that has a grab  bar. Keep electrical cords out of the way. Do not use floor polish or wax that makes floors slippery. If you must use wax, use non-skid floor wax. Do not have throw rugs and other things on the floor that can make you trip. What can I do with my stairs? Do not leave any items on the stairs. Make sure that there are handrails on both sides of the stairs and use them. Fix handrails that are broken or loose. Make sure that handrails are as long as the stairways. Check any carpeting to make sure that it is firmly attached to the stairs. Fix any carpet that is loose or worn. Avoid having throw rugs at the top or bottom of the stairs. If you do have throw rugs, attach them to the floor with carpet tape. Make sure that you have a light switch at the top of the stairs and the bottom of the stairs. If you do not have them, ask someone to add them for you. What else can I do to help prevent falls? Wear shoes that: Do not have high heels. Have  rubber bottoms. Are comfortable and fit you well. Are closed at the toe. Do not wear sandals. If you use a stepladder: Make sure that it is fully opened. Do not climb a closed stepladder. Make sure that both sides of the stepladder are locked into place. Ask someone to hold it for you, if possible. Clearly mark and make sure that you can see: Any grab bars or handrails. First and last steps. Where the edge of each step is. Use tools that help you move around (mobility aids) if they are needed. These include: Canes. Walkers. Scooters. Crutches. Turn on the lights when you go into a dark area. Replace any light bulbs as soon as they burn out. Set up your furniture so you have a clear path. Avoid moving your furniture around. If any of your floors are uneven, fix them. If there are any pets around you, be aware of where they are. Review your medicines with your doctor. Some medicines can make you feel dizzy. This can increase your chance of falling. Ask your doctor what other things that you can do to help prevent falls. This information is not intended to replace advice given to you by your health care provider. Make sure you discuss any questions you have with your health care provider. Document Released: 08/04/2009 Document Revised: 03/15/2016 Document Reviewed: 11/12/2014 Elsevier Interactive Patient Education  2017 [employer].

## 2023-01-04 ENCOUNTER — Ambulatory Visit: Payer: Medicare Other

## 2023-09-12 ENCOUNTER — Ambulatory Visit (INDEPENDENT_AMBULATORY_CARE_PROVIDER_SITE_OTHER): Payer: Medicare Other

## 2023-09-12 DIAGNOSIS — Z23 Encounter for immunization: Secondary | ICD-10-CM | POA: Diagnosis not present

## 2023-09-12 NOTE — Progress Notes (Signed)
Patient is in office today for a nurse visit for  Flu Vaccine , per PCP's order. Patient Injection was given in the  Left deltoid. Patient tolerated injection well.

## 2023-12-13 ENCOUNTER — Ambulatory Visit: Payer: Medicare Other | Admitting: Family Medicine

## 2023-12-13 ENCOUNTER — Encounter: Payer: Self-pay | Admitting: Family Medicine

## 2023-12-13 VITALS — BP 122/72 | HR 76 | Temp 97.7°F | Ht 75.0 in | Wt 224.6 lb

## 2023-12-13 DIAGNOSIS — N529 Male erectile dysfunction, unspecified: Secondary | ICD-10-CM

## 2023-12-13 DIAGNOSIS — E785 Hyperlipidemia, unspecified: Secondary | ICD-10-CM | POA: Diagnosis not present

## 2023-12-13 DIAGNOSIS — D696 Thrombocytopenia, unspecified: Secondary | ICD-10-CM | POA: Diagnosis not present

## 2023-12-13 DIAGNOSIS — R739 Hyperglycemia, unspecified: Secondary | ICD-10-CM | POA: Diagnosis not present

## 2023-12-13 DIAGNOSIS — R351 Nocturia: Secondary | ICD-10-CM

## 2023-12-13 LAB — COMPREHENSIVE METABOLIC PANEL
ALT: 6 U/L (ref 0–53)
AST: 15 U/L (ref 0–37)
Albumin: 4.3 g/dL (ref 3.5–5.2)
Alkaline Phosphatase: 92 U/L (ref 39–117)
BUN: 9 mg/dL (ref 6–23)
CO2: 31 meq/L (ref 19–32)
Calcium: 9.1 mg/dL (ref 8.4–10.5)
Chloride: 101 meq/L (ref 96–112)
Creatinine, Ser: 0.78 mg/dL (ref 0.40–1.50)
GFR: 91.66 mL/min (ref >60.00)
Glucose, Bld: 92 mg/dL (ref 70–99)
Potassium: 4.3 meq/L (ref 3.5–5.1)
Sodium: 138 meq/L (ref 135–145)
Total Bilirubin: 0.7 mg/dL (ref 0.2–1.2)
Total Protein: 6.9 g/dL (ref 6.0–8.3)

## 2023-12-13 LAB — CBC
HCT: 47.9 % (ref 39.0–52.0)
Hemoglobin: 16.1 g/dL (ref 13.0–17.0)
MCHC: 33.6 g/dL (ref 30.0–36.0)
MCV: 89.9 fL (ref 78.0–100.0)
Platelets: 253 10*3/uL (ref 150.0–400.0)
RBC: 5.33 Mil/uL (ref 4.22–5.81)
RDW: 13.9 % (ref 11.5–15.5)
WBC: 6.3 10*3/uL (ref 4.0–10.5)

## 2023-12-13 LAB — LIPID PANEL
Cholesterol: 152 mg/dL (ref 0–200)
HDL: 43.9 mg/dL (ref >39.00)
LDL Cholesterol: 90 mg/dL (ref 0–99)
NonHDL: 108.05
Total CHOL/HDL Ratio: 3
Triglycerides: 88 mg/dL (ref 0.0–149.0)
VLDL: 17.6 mg/dL (ref 0.0–40.0)

## 2023-12-13 LAB — PSA: PSA: 1.15 ng/mL (ref 0.10–4.00)

## 2023-12-13 LAB — TSH: TSH: 1.59 u[IU]/mL (ref 0.35–5.50)

## 2023-12-13 LAB — HEMOGLOBIN A1C: Hgb A1c MFr Bld: 5.8 % (ref 4.6–6.5)

## 2023-12-13 NOTE — Assessment & Plan Note (Signed)
 Check CBC

## 2023-12-13 NOTE — Assessment & Plan Note (Signed)
Overall stable.  Has used sildenafil in the past however does not need any refills on this today.

## 2023-12-13 NOTE — Progress Notes (Signed)
Danny Kerr is a 68 y.o. male who presents today for an office visit.  Assessment/Plan:  Chronic Problems Addressed Today: Thrombocytopenia (HCC) Check CBC.   Hyperglycemia Check A1c.  Erectile dysfunction Overall stable.  Has used sildenafil in the past however does not need any refills on this today.  Dyslipidemia Check lipids.  Discussed lifestyle modifications.  Preventative health care Check labs.  Due for colonoscopy next year.  He can get shingles vaccine at the pharmacy.     Subjective:  HPI:  See Assessment / plan for status of chronic conditions.  He has no acute concerns today. He is here today for his annual visit.   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   Past Surgical History:  Procedure Laterality Date   COLONOSCOPY     FEMUR IM NAIL Left 07/16/2020   Procedure: INTRAMEDULLARY (IM) NAIL FEMORAL;  Surgeon: Myrene Galas, MD;  Location: [facility] OR;  Service: Orthopedics;  Laterality: Left;   IR THORACENTESIS ASP PLEURAL SPACE W/IMG GUIDE  11/25/2020   KNEE ARTHROTOMY  1977   rt knee   RADIOLOGY WITH ANESTHESIA N/A 09/03/2017   Procedure: MIR CERVICAL Electa Sniff CONSTRAST;  Surgeon: Radiologist, Medication, MD;  Location: [facility] OR;  Service: Radiology;  Laterality: N/A;   TOTAL KNEE ARTHROPLASTY Right 09/06/2014   Procedure: RIGHT TOTAL KNEE ARTHROPLASTY;  Surgeon: Loanne Drilling, MD;  Location: [facility];  Service: Orthopedics;  Laterality: Right;    Family History  Problem Relation Age of Onset   Lung cancer Mother    Cancer Mother    Parkinson's disease Sister    Heart disease Sister     Medications- reviewed and updated Current Outpatient Medications  Medication Sig Dispense Refill   sildenafil (VIAGRA) 100 MG tablet Take 0.5-1 tablets (50-100 mg total) by mouth daily as needed for erectile dysfunction. (Patient not taking: Reported on 12/13/2023) 5 tablet 11   No current facility-administered medications for this visit.    Allergies-reviewed and updated No Known Allergies  Social History   Socioeconomic History   Marital status: Legally Separated    Spouse name: Not on file   Number of children: Not on file   Years of education: Not on file   Highest education level: Not on file  Occupational History   Occupation: retired  Tobacco Use   Smoking status: Former    Current packs/day: 0.00    Types: Cigarettes    Start date: 08/26/1984    Quit date: 08/26/1989    Years since quitting: 34.3   Smokeless tobacco: Never  Vaping Use   Vaping status: Never Used  Substance and Sexual Activity   Alcohol use: Not Currently    Comment: heavy daily alcohol use   Drug use: No   Sexual activity: Not on file  Other Topics Concern   Not on file  Social History Narrative   **  Merged History Encounter **       Social Drivers of Health   Financial Resource Strain: Low Risk  (12/25/2022)   Overall Financial Resource Strain (CARDIA)    Difficulty of Paying Living Expenses: Not hard at all  Food Insecurity: No Food Insecurity (12/25/2022)   Hunger Vital Sign    Worried About Running Out of Food in the Last Year: Never true    Ran Out of Food in the Last Year: Never true  Transportation Needs: No Transportation Needs (12/25/2022)   PRAPARE - Administrator, Civil Service (Medical): No    Lack of Transportation (Non-Medical): No  Physical  Activity: Sufficiently Active (12/25/2022)   Exercise Vital Sign    Days of Exercise per Week: 6 days    Minutes of Exercise per Session: 60 min  Stress: No Stress Concern Present (12/25/2022)   [employer] of Occupational Health - Occupational Stress Questionnaire    Feeling of Stress : Not at all  Social Connections: Moderately Isolated (12/25/2022)   Social Connection and Isolation Panel [NHANES]    Frequency of Communication with Friends and Family: More than three times a week    Frequency of Social Gatherings with Friends and Family: More than three times a week    Attends Religious Services: More than 4 times per year    Active Member of [employer] or Organizations: No    Attends Engineer, structural: Never    Marital Status: Separated          Objective:  Physical Exam: BP 122/72   Pulse 76   Temp 97.7 F (36.5 C) (Temporal)   Ht 6\' 3"  (1.905 m)   Wt 224 lb 9.6 oz (101.9 kg)   SpO2 98%   BMI 28.07 kg/m   Wt Readings from Last 3 Encounters:  12/13/23 224 lb 9.6 oz (101.9 kg)  12/25/22 223 lb (101.2 kg)  12/11/22 223 lb (101.2 kg)    Gen: No acute distress, resting comfortably CV: Regular rate and rhythm with no murmurs appreciated Pulm: Normal work of breathing, clear to auscultation bilaterally with no crackles, wheezes, or rhonchi Neuro: Grossly normal, moves all extremities Psych: Normal affect and thought content      Henning Ehle M. Jimmey Ralph, MD 12/13/2023 9:47 AM

## 2023-12-13 NOTE — Assessment & Plan Note (Signed)
 Check lipids. Discussed lifestyle modifications.

## 2023-12-13 NOTE — Patient Instructions (Addendum)
It was very nice to see you today!  We will check blood work today. Please continue to work on diet and exercise.  Return in about 1 year (around 12/12/2024) for Annual Physical.   Take care, Dr Jimmey Ralph  PLEASE NOTE:  If you had any lab tests, please let us know if you have not heard back within a few days. You may see your results on mychart before we have a chance to review them but we will give you a call once they are reviewed by [place].   If we ordered any referrals today, please let us know if you have not heard from their office within the next week.   If you had any urgent prescriptions sent in today, please check with the pharmacy within an hour of our visit to make sure the prescription was transmitted appropriately.   Please try these tips to maintain a healthy lifestyle:  Eat at least 3 REAL meals and 1-2 snacks per day.  Aim for no more than 5 hours between eating.  If you eat breakfast, please do so within one hour of getting up.   Each meal should contain half fruits/vegetables, one quarter protein, and one quarter carbs (no bigger than a computer mouse)  Cut down on sweet beverages. This includes juice, soda, and sweet tea.   Drink at least 1 glass of water with each meal and aim for at least 8 glasses per day  Exercise at least 150 minutes every week.    Preventive Care 38 Years and Older, Male Preventive care refers to lifestyle choices and visits with your health care provider that can promote health and wellness. Preventive care visits are also called wellness exams. What can I expect for my preventive care visit? Counseling During your preventive care visit, your health care provider may ask about your: Medical history, including: Past medical problems. Family medical history. History of falls. Current health, including: Emotional well-being. Home life and relationship well-being. Sexual activity. Memory and ability to understand (cognition). Lifestyle,  including: Alcohol, nicotine or tobacco, and drug use. Access to firearms. Diet, exercise, and sleep habits. Work and work Astronomer. Sunscreen use. Safety issues such as seatbelt and bike helmet use. Physical exam Your health care provider will check your: Height and weight. These may be used to calculate your BMI (body mass index). BMI is a measurement that tells if you are at a healthy weight. Waist circumference. This measures the distance around your waistline. This measurement also tells if you are at a healthy weight and may help predict your risk of certain diseases, such as type 2 diabetes and high blood pressure. Heart rate and blood pressure. Body temperature. Skin for abnormal spots. What immunizations do I need?  Vaccines are usually given at various ages, according to a schedule. Your health care provider will recommend vaccines for you based on your age, medical history, and lifestyle or other factors, such as travel or where you work. What tests do I need? Screening Your health care provider may recommend screening tests for certain conditions. This may include: Lipid and cholesterol levels. Diabetes screening. This is done by checking your blood sugar (glucose) after you have not eaten for a while (fasting). Hepatitis C test. Hepatitis B test. HIV (human immunodeficiency virus) test. STI (sexually transmitted infection) testing, if you are at risk. Lung cancer screening. Colorectal cancer screening. Prostate cancer screening. Abdominal aortic aneurysm (AAA) screening. You may need this if you are a current or former smoker. Talk  with your health care provider about your test results, treatment options, and if necessary, the need for more tests. Follow these instructions at home: Eating and drinking  Eat a diet that includes fresh fruits and vegetables, whole grains, lean protein, and low-fat dairy products. Limit your intake of foods with high amounts of sugar,  saturated fats, and salt. Take vitamin and mineral supplements as recommended by your health care provider. Do not drink alcohol if your health care provider tells you not to drink. If you drink alcohol: Limit how much you have to 0-2 drinks a day. Know how much alcohol is in your drink. In the [place] one drink equals one 12 oz bottle of beer (355 mL), one 5 oz glass of wine (148 mL), or one 1 oz glass of hard liquor (44 mL). Lifestyle Brush your teeth every morning and night with fluoride toothpaste. Floss one time each day. Exercise for at least 30 minutes 5 or more days each week. Do not use any products that contain nicotine or tobacco. These products include cigarettes, chewing tobacco, and vaping devices, such as e-cigarettes. If you need help quitting, ask your health care provider. Do not use drugs. If you are sexually active, practice safe sex. Use a condom or other form of protection to prevent STIs. Take aspirin only as told by your health care provider. Make sure that you understand how much to take and what form to take. Work with your health care provider to find out whether it is safe and beneficial for you to take aspirin daily. Ask your health care provider if you need to take a cholesterol-lowering medicine (statin). Find healthy ways to manage stress, such as: Meditation, yoga, or listening to music. Journaling. Talking to a trusted person. Spending time with friends and family. Safety Always wear your seat belt while driving or riding in a vehicle. Do not drive: If you have been drinking alcohol. Do not ride with someone who has been drinking. When you are tired or distracted. While texting. If you have been using any mind-altering substances or drugs. Wear a helmet and other protective equipment during sports activities. If you have firearms in your house, make sure you follow all gun safety procedures. Minimize exposure to UV radiation to reduce your risk of skin  cancer. What's next? Visit your health care provider once a year for an annual wellness visit. Ask your health care provider how often you should have your eyes and teeth checked. Stay up to date on all vaccines. This information is not intended to replace advice given to you by your health care provider. Make sure you discuss any questions you have with your health care provider. Document Revised: 04/05/2021 Document Reviewed: 04/05/2021 Elsevier Patient Education  2024 [employer].

## 2023-12-13 NOTE — Assessment & Plan Note (Signed)
 Check A1c.

## 2023-12-16 NOTE — Progress Notes (Signed)
 Labs are all stable.  Do not need to make any changes to treatment plan.  He should continue to work on diet and exercise and we can recheck again in a year or so.

## 2023-12-18 ENCOUNTER — Telehealth: Payer: Self-pay | Admitting: *Deleted

## 2023-12-18 NOTE — Telephone Encounter (Signed)
 Copied from CRM [id number]. Topic: Clinical - Lab/Test Results >> Dec 18, 2023 10:41 AM Corin V wrote: Reason for CRM: Patient called back to go over lab results. He had no questions at this time. He asked if the results could be printed and left at the front desk for him to pick up tomorrow.  Lab printed and placed in front office ready to pick up  Patient aware

## 2023-12-30 ENCOUNTER — Ambulatory Visit (INDEPENDENT_AMBULATORY_CARE_PROVIDER_SITE_OTHER): Payer: Medicare Other

## 2023-12-30 VITALS — Ht 75.0 in | Wt 224.0 lb

## 2023-12-30 DIAGNOSIS — Z0000 Encounter for general adult medical examination without abnormal findings: Secondary | ICD-10-CM | POA: Diagnosis not present

## 2023-12-30 NOTE — Patient Instructions (Signed)
 Danny Kerr , Thank you for taking time to come for your Medicare Wellness Visit. I appreciate your ongoing commitment to your health goals. Please review the following plan we discussed and let me know if I can assist you in the future.   Referrals/Orders/Follow-Ups/Clinician Recommendations: maintain health and activity   This is a list of the screening recommended for you and due dates:  Health Maintenance  Topic Date Due   COVID-19 Vaccine (6 - 2024-25 season) 06/23/2023   Zoster (Shingles) Vaccine (1 of 2) 03/11/2024*   Hepatitis C Screening  12/12/2024*   Colon Cancer Screening  11/22/2024   Medicare Annual Wellness Visit  12/29/2024   DTaP/Tdap/Td vaccine (2 - Tdap) 12/05/2025   Pneumonia Vaccine  Completed   Flu Shot  Completed   HPV Vaccine  Aged Out  *Topic was postponed. The date shown is not the original due date.    Advanced directives: (Copy Requested) Please bring a copy of your health care power of attorney and living will to the office to be added to your chart at your convenience. You can mail to [facility] [address] [place], [place] [address] or email to [email or web address]  Next Medicare Annual Wellness Visit scheduled for next year: Yes

## 2023-12-30 NOTE — Progress Notes (Signed)
 Subjective:   Danny Kerr is a 68 y.o. who presents for a Medicare Wellness preventive visit.  Visit Complete: Virtual I connected with  Danny Kerr on 12/30/23 by a audio enabled telemedicine application and verified that I am speaking with the correct person using two identifiers.  Patient Location: Home  Provider Location: Office/Clinic  I discussed the limitations of evaluation and management by telemedicine. The patient expressed understanding and agreed to proceed.  Vital Signs: Because this visit was a virtual/telehealth visit, some criteria may be missing or patient reported. Any vitals not documented were not able to be obtained and vitals that have been documented are patient reported.  VideoDeclined- This patient declined Librarian, academic. Therefore the visit was completed with audio only.  AWV Questionnaire: No: Patient Medicare AWV questionnaire was not completed prior to this visit.  Cardiac Risk Factors include: advanced age (>43men, >72 women);dyslipidemia;male gender     Objective:    Today's Vitals   12/30/23 0857  Weight: 224 lb (101.6 kg)  Height: 6\' 3"  (1.905 m)   Body mass index is 28 kg/m.     12/30/2023    8:59 AM 12/25/2022    8:29 AM 12/22/2021    2:32 PM 10/26/2020    3:04 PM 06/27/2020    9:43 PM 06/27/2020    6:58 AM 02/03/2020   11:46 AM  Advanced Directives  Does Patient Have a Medical Advance Directive? Yes Yes Yes Yes No No No  Type of Estate agent of [place];Living will Healthcare Power of [place];Living will Healthcare Power of [employer] of [place];Living will     Copy of Healthcare Power of Attorney in Chart? No - copy requested No - copy requested No - copy requested Yes - validated most recent copy scanned in chart (See row information)     Would patient like information on creating a medical advance directive?     No - Patient declined No - Patient  declined     Current Medications (verified) Outpatient Encounter Medications as of 12/30/2023  Medication Sig   sildenafil (VIAGRA) 100 MG tablet Take 0.5-1 tablets (50-100 mg total) by mouth daily as needed for erectile dysfunction.   No facility-administered encounter medications on file as of 12/30/2023.    Allergies (verified) Patient has no known allergies.   History: Past Medical History:  Diagnosis Date   Arrhythmia    Arthritis    Displaced segmental fracture of shaft of left femur, initial encounter for closed fracture (HCC) 07/16/2020   ETOH abuse    Pneumonia    Rosacea    Seasonal allergies    Past Surgical History:  Procedure Laterality Date   COLONOSCOPY     FEMUR IM NAIL Left 07/16/2020   Procedure: INTRAMEDULLARY (IM) NAIL FEMORAL;  Surgeon: Myrene Galas, MD;  Location: [facility] OR;  Service: Orthopedics;  Laterality: Left;   IR THORACENTESIS ASP PLEURAL SPACE W/IMG GUIDE  11/25/2020   KNEE ARTHROTOMY  1977   rt knee   RADIOLOGY WITH ANESTHESIA N/A 09/03/2017   Procedure: MIR CERVICAL Electa Sniff CONSTRAST;  Surgeon: Radiologist, Medication, MD;  Location: [facility] OR;  Service: Radiology;  Laterality: N/A;   TOTAL KNEE ARTHROPLASTY Right 09/06/2014   Procedure: RIGHT TOTAL KNEE ARTHROPLASTY;  Surgeon: Loanne Drilling, MD;  Location: [facility];  Service: Orthopedics;  Laterality: Right;   Family History  Problem Relation Age of Onset   Lung cancer Mother    Cancer Mother  Parkinson's disease Sister    Heart disease Sister    Social History   Socioeconomic History   Marital status: Legally Separated    Spouse name: Not on file   Number of children: Not on file   Years of education: Not on file   Highest education level: Not on file  Occupational History   Occupation: retired  Tobacco Use   Smoking status: Former    Current packs/day: 0.00    Types: Cigarettes    Start date: 08/26/1984    Quit date: 08/26/1989    Years since quitting: 34.3   Smokeless tobacco:  Never  Vaping Use   Vaping status: Never Used  Substance and Sexual Activity   Alcohol use: Not Currently    Comment: heavy daily alcohol use   Drug use: No   Sexual activity: Not on file  Other Topics Concern   Not on file  Social History Narrative   ** Merged History Encounter **       Social Drivers of Health   Financial Resource Strain: Low Risk  (12/30/2023)   Overall Financial Resource Strain (CARDIA)    Difficulty of Paying Living Expenses: Not hard at all  Food Insecurity: No Food Insecurity (12/30/2023)   Hunger Vital Sign    Worried About Running Out of Food in the Last Year: Never true    Ran Out of Food in the Last Year: Never true  Transportation Needs: No Transportation Needs (12/30/2023)   PRAPARE - Administrator, Civil Service (Medical): No    Lack of Transportation (Non-Medical): No  Physical Activity: Sufficiently Active (12/30/2023)   Exercise Vital Sign    Days of Exercise per Week: 6 days    Minutes of Exercise per Session: 60 min  Stress: No Stress Concern Present (12/30/2023)   [employer] of Occupational Health - Occupational Stress Questionnaire    Feeling of Stress : Not at all  Social Connections: Moderately Isolated (12/30/2023)   Social Connection and Isolation Panel [NHANES]    Frequency of Communication with Friends and Family: More than three times a week    Frequency of Social Gatherings with Friends and Family: More than three times a week    Attends Religious Services: More than 4 times per year    Active Member of [employer] or Organizations: No    Attends Banker Meetings: Never    Marital Status: Separated    Tobacco Counseling Counseling given: Not Answered    Clinical Intake:  Pre-visit preparation completed: Yes  Pain : No/denies pain     BMI - recorded: 28 Nutritional Status: BMI 25 -29 Overweight Nutritional Risks: None Diabetes: No  How often do you need to have someone help you when you read  instructions, pamphlets, or other written materials from your doctor or pharmacy?: 1 - Never  Interpreter Needed?: No  Information entered by :: Lanier Ensign, LPN   Activities of Daily Living     12/30/2023    9:11 AM  In your present state of health, do you have any difficulty performing the following activities:  Hearing? 0  Vision? 0  Difficulty concentrating or making decisions? 0  Walking or climbing stairs? 0  Dressing or bathing? 0  Doing errands, shopping? 0  Preparing Food and eating ? N  Using the Toilet? N  In the past six months, have you accidently leaked urine? N  Do you have problems with loss of bowel control? N  Managing your Medications?  N  Managing your Finances? N  Housekeeping or managing your Housekeeping? N    Patient Care Team: Ardith Dark, MD as PCP - General (Family Medicine) O'Neal, Ronnald Ramp, MD as PCP - Cardiology (Internal Medicine) Ardith Dark, MD (Family Medicine)  Indicate any recent Medical Services you may have received from other than [facility] providers in the past year (date may be approximate).     Assessment:   This is a routine wellness examination for [place].  Hearing/Vision screen Hearing Screening - Comments:: Pt denies any hearing issues Vision Screening - Comments:: Pt [place] up [place] by [facility] diner    Goals Addressed             This Visit's Progress    Patient Stated       Maintain health and activity        Depression Screen     12/30/2023    8:59 AM 12/13/2023    9:13 AM 12/25/2022    8:28 AM 12/11/2022    9:09 AM 12/22/2021    2:31 PM 12/07/2021   10:50 AM 07/28/2021   11:55 AM  PHQ 2/9 Scores  PHQ - 2 Score 0 0 0 0 0 0 0    Fall Risk     12/30/2023    9:38 AM 12/13/2023    9:13 AM 12/25/2022    8:29 AM 12/11/2022    9:10 AM 12/22/2021    2:33 PM  Fall Risk   Falls in the past year? 0 0 0 0 0  Number falls in past yr: 0 0 0 0 0  Injury with Fall? 0  0 0 0  Risk for fall  due to : No Fall Risks No Fall Risks Impaired vision No Fall Risks Impaired vision  Follow up Falls prevention discussed  Falls prevention discussed  Falls prevention discussed    MEDICARE RISK AT HOME:  Medicare Risk at Home Any stairs in or around the home?: No If so, are there any without handrails?: No Home free of loose throw rugs in walkways, pet beds, electrical cords, etc?: Yes Adequate lighting in your home to reduce risk of falls?: Yes Life alert?: No Use of a cane, walker or w/c?: No Grab bars in the bathroom?: Yes Shower chair or bench in shower?: No Elevated toilet seat or a handicapped toilet?: No  TIMED UP AND GO:  Was the test performed?  No  Cognitive Function: 6CIT completed        12/30/2023    9:38 AM 12/25/2022    8:30 AM 12/22/2021    2:35 PM  6CIT Screen  What Year? 0 points 0 points 0 points  What month? 0 points 0 points 0 points  What time? 0 points 0 points 0 points  Count back from 20 0 points 0 points 0 points  Months in reverse 0 points 0 points 0 points  Repeat phrase 4 points 0 points 0 points  Total Score 4 points 0 points 0 points    Immunizations Immunization History  Administered Date(s) Administered   Fluad Quad(high Dose 65+) 07/28/2021, 08/08/2022   Fluad Trivalent(High Dose 65+) 09/12/2023   Influenza Inj Mdck Quad Pf 09/18/2017   Influenza, Quadrivalent, Recombinant, Inj, Pf 07/23/2018   Influenza,inj,Quad PF,6+ Mos 08/05/2019   Influenza,inj,quad, With Preservative 07/22/2017   Moderna SARS-COV2 Booster Vaccination 09/06/2020   PFIZER(Purple Top)SARS-COV-2 Vaccination 01/18/2020, 02/23/2020, 10/03/2020   PNEUMOCOCCAL CONJUGATE-20 12/07/2021   Pfizer Covid Bivalent Pediatric Vaccine(61mos to <67yrs)  11/02/2021   Td 12/06/2015    Screening Tests Health Maintenance  Topic Date Due   COVID-19 Vaccine (6 - 2024-25 season) 06/23/2023   Zoster Vaccines- Shingrix (1 of 2) 03/11/2024 (Originally 06/26/2005)   Hepatitis C Screening   12/12/2024 (Originally [birth year]26[birth year]1973)   Colonoscopy  11/22/2024   Medicare Annual Wellness (AWV)  12/29/2024   DTaP/Tdap/Td (2 - Tdap) 12/05/2025   Pneumonia Vaccine 23+ Years old  Completed   INFLUENZA VACCINE  Completed   HPV VACCINES  Aged Out    Health Maintenance  Health Maintenance Due  Topic Date Due   COVID-19 Vaccine (6 - 2024-25 season) 06/23/2023   Health Maintenance Items Addressed: See Nurse Notes  Additional Screening:  Vision Screening: Recommended annual ophthalmology exams for early detection of glaucoma and other disorders of the eye.  Dental Screening: Recommended annual dental exams for proper oral hygiene  Community Resource Referral / Chronic Care Management: CRR required this visit?  No   CCM required this visit?  No     Plan:     I have personally reviewed and noted the following in the patient's chart:   Medical and social history Use of alcohol, tobacco or illicit drugs  Current medications and supplements including opioid prescriptions. Patient is not currently taking opioid prescriptions. Functional ability and status Nutritional status Physical activity Advanced directives List of other physicians Hospitalizations, surgeries, and ER visits in previous 12 months Vitals Screenings to include cognitive, depression, and falls Referrals and appointments  In addition, I have reviewed and discussed with patient certain preventive protocols, quality metrics, and best practice recommendations. A written personalized care plan for preventive services as well as general preventive health recommendations were provided to patient.     Marzella Schlein, LPN   1/61/0960   After Visit Summary: (Declined) Due to this being a telephonic visit, with patients personalized plan was offered to patient but patient Declined AVS at this time   Notes: Nothing significant to report at this time.

## 2024-06-29 ENCOUNTER — Ambulatory Visit (INDEPENDENT_AMBULATORY_CARE_PROVIDER_SITE_OTHER): Admitting: Family Medicine

## 2024-06-29 ENCOUNTER — Encounter: Payer: Self-pay | Admitting: Family Medicine

## 2024-06-29 VITALS — BP 126/76 | HR 74 | Temp 97.8°F | Resp 18 | Ht 75.0 in | Wt 236.5 lb

## 2024-06-29 DIAGNOSIS — L03115 Cellulitis of right lower limb: Secondary | ICD-10-CM

## 2024-06-29 MED ORDER — MUPIROCIN 2 % EX OINT: 1.0000 | TOPICAL_OINTMENT | Freq: Two times a day (BID) | CUTANEOUS | 0 refills | Status: AC

## 2024-06-29 MED ORDER — CEPHALEXIN 500 MG PO CAPS: 500.0000 mg | ORAL_CAPSULE | Freq: Two times a day (BID) | ORAL | 0 refills | Status: AC

## 2024-06-29 NOTE — Patient Instructions (Signed)
 It was very nice to see you today!  Will take time.  Monitor.  Meds sent to pharmacy.   Let us  know if worse, etc   PLEASE NOTE:  If you had any lab tests please let us  know if you have not heard back within a few days. You may see your results on MyChart before we have a chance to review them but we will give you a call once they are reviewed by us . If we ordered any referrals today, please let us  know if you have not heard from their office within the next week.   Please try these tips to maintain a healthy lifestyle:  Eat most of your calories during the day when you are active. Eliminate processed foods including packaged sweets (pies, cakes, cookies), reduce intake of potatoes, white bread, white pasta, and white rice. Look for whole grain options, oat flour or almond flour.  Each meal should contain half fruits/vegetables, one quarter protein, and one quarter carbs (no bigger than a computer mouse).  Cut down on sweet beverages. This includes juice, soda, and sweet tea. Also watch fruit intake, though this is a healthier sweet option, it still contains natural sugar! Limit to 3 servings daily.  Drink at least 1 glass of water with each meal and aim for at least 8 glasses per day  Exercise at least 150 minutes every week.
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
## 2024-12-15 ENCOUNTER — Ambulatory Visit: Payer: Medicare Other | Admitting: Family Medicine

## 2024-12-31 ENCOUNTER — Ambulatory Visit
